# Patient Record
Sex: Female | Born: 1960
Health system: Southern US, Community
[De-identification: ages and names within clinical notes are randomized; demographics above are authoritative.]

## PROBLEM LIST (undated history)

## (undated) DIAGNOSIS — E785 Hyperlipidemia, unspecified: Secondary | ICD-10-CM

## (undated) DIAGNOSIS — N189 Chronic kidney disease, unspecified: Secondary | ICD-10-CM

## (undated) DIAGNOSIS — G2581 Restless legs syndrome: Secondary | ICD-10-CM

## (undated) DIAGNOSIS — I10 Essential (primary) hypertension: Secondary | ICD-10-CM

## (undated) DIAGNOSIS — E119 Type 2 diabetes mellitus without complications: Secondary | ICD-10-CM

## (undated) HISTORY — PX: BREAST SURGERY: SHX581

## (undated) HISTORY — DX: Type 2 diabetes mellitus without complications: E11.9

## (undated) HISTORY — PX: ABDOMINAL HYSTERECTOMY: SHX81

## (undated) HISTORY — PX: LAPAROSCOPY: SHX197

## (undated) HISTORY — DX: Restless legs syndrome: G25.81

## (undated) HISTORY — DX: Essential (primary) hypertension: I10

## (undated) HISTORY — PX: FOOT SURGERY: SHX648

## (undated) HISTORY — DX: Hyperlipidemia, unspecified: E78.5

## (undated) HISTORY — DX: Chronic kidney disease, unspecified: N18.9

---

## 1997-06-05 HISTORY — PX: BREAST EXCISIONAL BIOPSY: SUR124

## 2005-05-06 ENCOUNTER — Emergency Department: Payer: Self-pay | Admitting: Emergency Medicine

## 2007-04-15 DIAGNOSIS — K2100 Gastro-esophageal reflux disease with esophagitis, without bleeding: Secondary | ICD-10-CM | POA: Insufficient documentation

## 2007-11-04 DIAGNOSIS — I10 Essential (primary) hypertension: Secondary | ICD-10-CM | POA: Insufficient documentation

## 2007-11-04 DIAGNOSIS — E785 Hyperlipidemia, unspecified: Secondary | ICD-10-CM | POA: Insufficient documentation

## 2007-11-04 DIAGNOSIS — E1169 Type 2 diabetes mellitus with other specified complication: Secondary | ICD-10-CM | POA: Insufficient documentation

## 2008-07-08 ENCOUNTER — Ambulatory Visit: Payer: Self-pay | Admitting: Family Medicine

## 2009-04-05 DIAGNOSIS — E559 Vitamin D deficiency, unspecified: Secondary | ICD-10-CM | POA: Insufficient documentation

## 2009-07-27 ENCOUNTER — Ambulatory Visit: Payer: Self-pay | Admitting: Family Medicine

## 2009-08-07 DIAGNOSIS — J309 Allergic rhinitis, unspecified: Secondary | ICD-10-CM | POA: Insufficient documentation

## 2009-09-21 DIAGNOSIS — G2581 Restless legs syndrome: Secondary | ICD-10-CM | POA: Insufficient documentation

## 2010-08-15 ENCOUNTER — Ambulatory Visit: Payer: Self-pay | Admitting: Family Medicine

## 2010-11-16 LAB — HM COLONOSCOPY

## 2011-08-30 ENCOUNTER — Ambulatory Visit: Payer: Self-pay | Admitting: Family Medicine

## 2012-04-11 ENCOUNTER — Ambulatory Visit: Payer: Self-pay | Admitting: Family Medicine

## 2012-05-17 ENCOUNTER — Ambulatory Visit: Payer: Self-pay | Admitting: Family Medicine

## 2012-05-27 ENCOUNTER — Ambulatory Visit: Payer: Self-pay | Admitting: Family Medicine

## 2012-09-02 ENCOUNTER — Ambulatory Visit: Payer: Self-pay | Admitting: Family Medicine

## 2012-09-30 ENCOUNTER — Ambulatory Visit: Payer: Self-pay | Admitting: Physician Assistant

## 2014-03-12 ENCOUNTER — Ambulatory Visit: Payer: Self-pay | Admitting: Family Medicine

## 2014-08-10 ENCOUNTER — Ambulatory Visit: Payer: Self-pay | Admitting: Family Medicine

## 2014-08-10 LAB — CBC AND DIFFERENTIAL
HEMATOCRIT: 44 % (ref 36–46)
HEMOGLOBIN: 15.3 g/dL (ref 12.0–16.0)
NEUTROS ABS: 7 /uL
Platelets: 313 10*3/uL (ref 150–399)
WBC: 11.5 10^3/mL

## 2014-08-10 LAB — LIPID PANEL
CHOLESTEROL: 213 mg/dL — AB (ref 0–200)
HDL: 40 mg/dL (ref 35–70)
LDL Cholesterol: 115 mg/dL
LDL/HDL RATIO: 2.9
TRIGLYCERIDES: 292 mg/dL — AB (ref 40–160)

## 2014-08-10 LAB — BASIC METABOLIC PANEL
BUN: 10 mg/dL (ref 4–21)
Creatinine: 0.6 mg/dL (ref 0.5–1.1)
GLUCOSE: 113 mg/dL
POTASSIUM: 4.1 mmol/L (ref 3.4–5.3)
SODIUM: 137 mmol/L (ref 137–147)

## 2014-08-10 LAB — HEPATIC FUNCTION PANEL
ALT: 28 U/L (ref 7–35)
AST: 25 U/L (ref 13–35)
Alkaline Phosphatase: 73 U/L (ref 25–125)
Bilirubin, Total: 0.5 mg/dL

## 2014-08-10 LAB — TSH: TSH: 2.15 u[IU]/mL (ref 0.41–5.90)

## 2015-07-12 DIAGNOSIS — R7303 Prediabetes: Secondary | ICD-10-CM | POA: Insufficient documentation

## 2015-07-12 DIAGNOSIS — G5620 Lesion of ulnar nerve, unspecified upper limb: Secondary | ICD-10-CM | POA: Insufficient documentation

## 2015-07-12 DIAGNOSIS — E669 Obesity, unspecified: Secondary | ICD-10-CM | POA: Insufficient documentation

## 2015-07-12 DIAGNOSIS — M1990 Unspecified osteoarthritis, unspecified site: Secondary | ICD-10-CM | POA: Insufficient documentation

## 2015-08-12 ENCOUNTER — Encounter: Payer: Self-pay | Admitting: Family Medicine

## 2015-08-25 ENCOUNTER — Other Ambulatory Visit: Payer: Self-pay | Admitting: Family Medicine

## 2015-08-31 ENCOUNTER — Encounter: Payer: Self-pay | Admitting: Family Medicine

## 2015-08-31 ENCOUNTER — Ambulatory Visit (INDEPENDENT_AMBULATORY_CARE_PROVIDER_SITE_OTHER): Payer: BLUE CROSS/BLUE SHIELD | Admitting: Family Medicine

## 2015-08-31 VITALS — BP 122/70 | HR 88 | Temp 98.1°F | Resp 16 | Ht 60.0 in | Wt 180.0 lb

## 2015-08-31 DIAGNOSIS — Z1211 Encounter for screening for malignant neoplasm of colon: Secondary | ICD-10-CM | POA: Diagnosis not present

## 2015-08-31 DIAGNOSIS — G2581 Restless legs syndrome: Secondary | ICD-10-CM | POA: Diagnosis not present

## 2015-08-31 DIAGNOSIS — Z0000 Encounter for general adult medical examination without abnormal findings: Secondary | ICD-10-CM | POA: Diagnosis not present

## 2015-08-31 DIAGNOSIS — R7303 Prediabetes: Secondary | ICD-10-CM | POA: Diagnosis not present

## 2015-08-31 DIAGNOSIS — Z1239 Encounter for other screening for malignant neoplasm of breast: Secondary | ICD-10-CM

## 2015-08-31 LAB — IFOBT (OCCULT BLOOD): IFOBT: NEGATIVE

## 2015-08-31 MED ORDER — TRAMADOL HCL 50 MG PO TABS: 50.0000 mg | ORAL_TABLET | Freq: Every day | ORAL | Status: DC

## 2015-08-31 NOTE — Progress Notes (Signed)
Patient ID: Barbara Holmes, female   DOB: [birth year], 55 y.o.   MRN: [id number]       Patient: Barbara Holmes, Female    DOB: 1961/04/06, 55 y.o.   MRN: [id number] Visit Date: 08/31/2015  Today's Provider: Wilhemena Durie, MD   Chief Complaint  Patient presents with  . Annual Exam   Subjective:    Annual physical exam Barbara Holmes is a 55 y.o. female who presents today for health maintenance and complete physical. She feels well. She reports exercising 2-3 times a week. She walks on the treadmill for 20-30 minutes. She reports she is sleeping poorly. Patient snores but a negative sleep study 10 years ago. She complains of significant restless leg syndrome with weights are up nightly ----------------------------------------------------------------- Colonoscopy- about 3 years ago per pt. Did not see record of this. Repeat 10 years. Dr. Vira Agar.  Mammogram- 03/12/14 negative BMD- 08/16/11- Normal Pap- 07/28/09  Immunization History  Administered Date(s) Administered  . Tdap 08/01/2012       Review of Systems  Constitutional: Negative.   HENT: Positive for ear pain, hearing loss, sinus pressure and sneezing.   Eyes: Positive for itching.  Respiratory: Negative.   Cardiovascular: Negative.   Gastrointestinal: Negative.   Endocrine: Negative.   Genitourinary: Negative.   Musculoskeletal: Positive for joint swelling and arthralgias.  Skin: Negative.   Allergic/Immunologic: Negative.   Neurological: Negative.   Hematological: Negative.   Psychiatric/Behavioral: Negative.     Social History      She         Social History   Social History  . Marital Status: Married    Spouse Name: N/A  . Number of Children: N/A  . Years of Education: N/A   Social History Main Topics  . Smoking status: Not on file  . Smokeless tobacco: Not on file  . Alcohol Use: Not on file  . Drug Use: Not on file  . Sexual Activity: Not on file   Other Topics Concern  . Not on  file   Social History Narrative  . No narrative on file    No past medical history on file.   Patient Active Problem List   Diagnosis Date Noted  . Arthritis 07/12/2015  . Borderline diabetes 07/12/2015  . Adiposity 07/12/2015  . Lesion of ulnar nerve 07/12/2015  . Restless leg 09/21/2009  . Allergic rhinitis 08/07/2009  . Avitaminosis D 04/05/2009  . Essential (primary) hypertension 11/04/2007  . HLD (hyperlipidemia) 11/04/2007  . Esophagitis, reflux 04/15/2007    Past Surgical History  Procedure Laterality Date  . Breast biopsy      benign  . Foot surgery    . Laparoscopy      endometriosis  . Abdominal hysterectomy      total    Family History        Family Status  Relation Status Death Age  . Mother Alive   . Father Alive   . Sister Alive   . Brother Deceased         Her family history includes Diabetes in her mother and sister; Hyperlipidemia in her father, mother, and sister; Hypertension in her father, mother, and sister; Skin cancer in her sister.    No Known Allergies  Previous Medications   CHOLECALCIFEROL 1000 UNITS CAPSULE    Take by mouth.   LANSOPRAZOLE (PREVACID) 30 MG CAPSULE    Take by mouth.   LISINOPRIL (PRINIVIL,ZESTRIL) 20 MG TABLET    TAKE 1 TABLET BY  MOUTH DAILY   NAPROXEN (NAPROSYN) 500 MG TABLET    Take by mouth.   SIMVASTATIN (ZOCOR) 10 MG TABLET    Take by mouth.    Patient Care Team: Jerrol Banana., MD as PCP - General (Family Medicine)     Objective:   Vitals: There were no vitals taken for this visit.   Physical Exam  Constitutional: She is oriented to person, place, and time. She appears well-developed and well-nourished.  HENT:  Head: Normocephalic and atraumatic.  Right Ear: External ear normal.  Left Ear: External ear normal.  Nose: Nose normal.  Mouth/Throat: Oropharynx is clear and moist.  Eyes: Conjunctivae and EOM are normal. Pupils are equal, round, and reactive to light.  Neck: Normal range of  motion. Neck supple.  Cardiovascular: Normal rate, regular rhythm, normal heart sounds and intact distal pulses.   Pulmonary/Chest: Effort normal and breath sounds normal.  Abdominal: Soft. Bowel sounds are normal.  Genitourinary: Guaiac negative stool.  Musculoskeletal: Normal range of motion.  Neurological: She is alert and oriented to person, place, and time. She has normal reflexes.  Skin: Skin is warm and dry.  Psychiatric: She has a normal mood and affect. Her behavior is normal. Judgment and thought content normal.  breasts-no masses   Depression Screen PHQ 2/9 Scores 08/31/2015  PHQ - 2 Score 0      Assessment & Plan:     Routine Health Maintenance and Physical Exam  Exercise Activities and Dietary recommendations Goals    None      Immunization History  Administered Date(s) Administered  . Tdap 08/01/2012    Health Maintenance  Topic Date Due  . Hepatitis C Screening  09-15-1960  . HIV Screening  03/21/1974  . PAP SMEAR  03/21/1980  . MAMMOGRAM  03/21/2009  . COLONOSCOPY  03/21/2009  . INFLUENZA VACCINE  01/04/2015  . TETANUS/TDAP  08/01/2022   1. Annual physical exam  - CBC with Differential/Platelet - Lipid Panel With LDL/HDL Ratio - TSH - Comprehensive metabolic panel  2. Borderline diabetes  - Hemoglobin A1c  3. RLS (restless legs syndrome) Follow up in 6 months, Pt will call if RLS gets worse. - traMADol (ULTRAM) 50 MG tablet; Take 1 tablet (50 mg total) by mouth at bedtime. 1-2 at bedtime  Dispense: 60 tablet; Refill: 5 4. Obesity   Discussed health benefits of physical activity, and encouraged her to engage in regular exercise appropriate for her age and condition.   I have done the exam and reviewed the above chart and it is accurate to the best of my knowledge.  --------------------------------------------------------------------

## 2015-09-01 ENCOUNTER — Telehealth: Payer: Self-pay

## 2015-09-01 DIAGNOSIS — E119 Type 2 diabetes mellitus without complications: Secondary | ICD-10-CM

## 2015-09-01 LAB — LIPID PANEL WITH LDL/HDL RATIO
Cholesterol, Total: 157 mg/dL (ref 100–199)
HDL: 26 mg/dL — AB (ref >39)
LDL Calculated: 86 mg/dL (ref 0–99)
LDl/HDL Ratio: 3.3 ratio units — ABNORMAL HIGH (ref 0.0–3.2)
Triglycerides: 224 mg/dL — ABNORMAL HIGH (ref 0–149)
VLDL Cholesterol Cal: 45 mg/dL — ABNORMAL HIGH (ref 5–40)

## 2015-09-01 LAB — COMPREHENSIVE METABOLIC PANEL
A/G RATIO: 1.4 (ref 1.2–2.2)
ALT: 37 IU/L — AB (ref 0–32)
AST: 37 IU/L (ref 0–40)
Albumin: 4.3 g/dL (ref 3.5–5.5)
Alkaline Phosphatase: 66 IU/L (ref 39–117)
BUN/Creatinine Ratio: 15 (ref 9–23)
BUN: 9 mg/dL (ref 6–24)
Bilirubin Total: 0.6 mg/dL (ref 0.0–1.2)
CALCIUM: 9.9 mg/dL (ref 8.7–10.2)
CO2: 30 mmol/L — ABNORMAL HIGH (ref 18–29)
Chloride: 92 mmol/L — ABNORMAL LOW (ref 96–106)
Creatinine, Ser: 0.59 mg/dL (ref 0.57–1.00)
GFR, EST AFRICAN AMERICAN: 118 mL/min/{1.73_m2} (ref >59)
GFR, EST NON AFRICAN AMERICAN: 103 mL/min/{1.73_m2} (ref >59)
GLUCOSE: 179 mg/dL — AB (ref 65–99)
Globulin, Total: 3 g/dL (ref 1.5–4.5)
Potassium: 5.1 mmol/L (ref 3.5–5.2)
Sodium: 140 mmol/L (ref 134–144)
TOTAL PROTEIN: 7.3 g/dL (ref 6.0–8.5)

## 2015-09-01 LAB — CBC WITH DIFFERENTIAL/PLATELET
BASOS: 0 %
Basophils Absolute: 0 10*3/uL (ref 0.0–0.2)
EOS (ABSOLUTE): 0.3 10*3/uL (ref 0.0–0.4)
EOS: 3 %
HEMOGLOBIN: 14.8 g/dL (ref 11.1–15.9)
Hematocrit: 43.8 % (ref 34.0–46.6)
IMMATURE GRANULOCYTES: 0 %
Immature Grans (Abs): 0 10*3/uL (ref 0.0–0.1)
LYMPHS: 28 %
Lymphocytes Absolute: 3.2 10*3/uL — ABNORMAL HIGH (ref 0.7–3.1)
MCH: 29.2 pg (ref 26.6–33.0)
MCHC: 33.8 g/dL (ref 31.5–35.7)
MCV: 86 fL (ref 79–97)
Monocytes Absolute: 0.6 10*3/uL (ref 0.1–0.9)
Monocytes: 5 %
NEUTROS PCT: 64 %
Neutrophils Absolute: 7.1 10*3/uL — ABNORMAL HIGH (ref 1.4–7.0)
Platelets: 302 10*3/uL (ref 150–379)
RBC: 5.07 x10E6/uL (ref 3.77–5.28)
RDW: 13.2 % (ref 12.3–15.4)
WBC: 11.2 10*3/uL — AB (ref 3.4–10.8)

## 2015-09-01 LAB — HEMOGLOBIN A1C
ESTIMATED AVERAGE GLUCOSE: 194 mg/dL
HEMOGLOBIN A1C: 8.4 % — AB (ref 4.8–5.6)

## 2015-09-01 LAB — TSH: TSH: 2.02 u[IU]/mL (ref 0.450–4.500)

## 2015-09-01 MED ORDER — METFORMIN HCL ER 500 MG PO TB24: 500.0000 mg | ORAL_TABLET | Freq: Every day | ORAL | Status: DC

## 2015-09-01 NOTE — Telephone Encounter (Signed)
Advised pt of lab results. Pt verbally acknowledges understanding. Sent in rx, scheduled referral as well as 3 month FU. Renaldo Fiddler, CMA

## 2015-09-01 NOTE — Telephone Encounter (Signed)
LMTCB 09/01/2015  Thanks,   -Laura  

## 2015-09-01 NOTE — Telephone Encounter (Signed)
-----   Message from Jerrol Banana., MD sent at 09/01/2015  3:45 PM EDT ----- Labs okay but with A1c of 8 patient is frankly diabetic. Start metformin 500 mg every evening and refer to [facility]. Return to clinic 3 months.

## 2015-09-14 ENCOUNTER — Encounter: Payer: BLUE CROSS/BLUE SHIELD | Attending: Family Medicine | Admitting: *Deleted

## 2015-09-14 ENCOUNTER — Encounter: Payer: Self-pay | Admitting: *Deleted

## 2015-09-14 VITALS — BP 140/86 | Ht 59.0 in | Wt 177.1 lb

## 2015-09-14 DIAGNOSIS — E119 Type 2 diabetes mellitus without complications: Secondary | ICD-10-CM | POA: Insufficient documentation

## 2015-09-14 NOTE — Patient Instructions (Addendum)
Check blood sugars 1 x day before breakfast or 2 hrs after supper every day Exercise: Continue walking for  30 minutes  3 days a week and gradually increase to 150 minutes/week Eat 3 meals day,  1-2 snacks a day Space meals 4-6 hours apart Avoid sugar sweetened drinks (juices) Bring blood sugar records to the next class

## 2015-09-15 NOTE — Progress Notes (Signed)
Diabetes Self-Management Education  Visit Type: First/Initial  Appt. Start Time: 1545 Appt. End Time: [id number]  09/15/2015  [name], identified by name and date of birth, is a 55 y.o. female with a diagnosis of Diabetes: Type 2.   ASSESSMENT  Blood pressure 140/86, height 4\' 11"  (1.499 m), weight 177 lb 1.6 oz (80.332 kg). Body mass index is 35.75 kg/(m^2).      Diabetes Self-Management Education - 09/14/15 1723    Visit Information   Visit Type First/Initial   Initial Visit   Diabetes Type Type 2   Are you currently following a meal plan? Yes   What type of meal plan do you follow? "quit eating sweets, no sodas, not night time eating"   Are you taking your medications as prescribed? Yes   Date Diagnosed 2 weeks ago   Health Coping   How would you rate your overall health? Good   Psychosocial Assessment   Patient Belief/Attitude about Diabetes Motivated to manage diabetes  feels "bad"   Self-care barriers None   Self-management support Doctor's office;Family   Patient Concerns Nutrition/Meal planning;Medication;Monitoring;Healthy Lifestyle;Glycemic Control;Weight Control;Problem Solving   Special Needs None   Preferred Learning Style Auditory;Visual   Learning Readiness Change in progress   How often do you need to have someone help you when you read instructions, pamphlets, or other written materials from your doctor or pharmacy? 1 - Never   What is the last grade level you completed in school? 12   Complications   Last HgB A1C per patient/outside source 8.4 %  09/01/15   How often do you check your blood sugar? 1-2 times/day   Fasting Blood glucose range (mg/dL) 130-179  Pt reports FBG's 143-175 mg/dL   Have you had a dilated eye exam in the past 12 months? Yes   Have you had a dental exam in the past 12 months? Yes   Are you checking your feet? No   Dietary Intake   Breakfast oatmeal, cereal and milk, crackers and boiled egg   Lunch nabs, ham/turkey sandwich,  cuccumber   Dinner baked fish stick, 4 tater tots, rice, peas, corn, pinto beans   Snack (evening) jello or sugar free pudding   Beverage(s) water, fruit juce, unsweetened coffee   Exercise   Exercise Type Light (walking / raking leaves)   How many days per week to you exercise? 3   How many minutes per day do you exercise? 30   Total minutes per week of exercise 90   Patient Education   Previous Diabetes Education No   Disease state  Definition of diabetes, type 1 and 2, and the diagnosis of diabetes   Nutrition management  Role of diet in the treatment of diabetes and the relationship between the three main macronutrients and blood glucose level;Food label reading, portion sizes and measuring food.   Physical activity and exercise  Role of exercise on diabetes management, blood pressure control and cardiac health.   Medications Reviewed patients medication for diabetes, action, purpose, timing of dose and side effects.   Monitoring Purpose and frequency of SMBG.;Identified appropriate SMBG and/or A1C goals.   Chronic complications Relationship between chronic complications and blood glucose control   Psychosocial adjustment Identified and addressed patients feelings and concerns about diabetes   Individualized Goals (developed by patient)   Reducing Risk Improve blood sugars Decrease medications Prevent diabetes complications Lose weight Lead a healthier lifestyle Become more fit   Outcomes   Expected Outcomes Demonstrated interest in  learning. Expect positive outcomes      Individualized Plan for Diabetes Self-Management Training:   Learning Objective:  Patient will have a greater understanding of diabetes self-management. Patient education plan is to attend individual and/or group sessions per assessed needs and concerns.   Plan:   Patient Instructions  Check blood sugars 1 x day before breakfast or 2 hrs after supper every day Exercise: Continue walking for  30 minutes  3  days a week and gradually increase to 150 minutes/week Eat 3 meals day,  1-2 snacks a day Space meals 4-6 hours apart Avoid sugar sweetened drinks (juices) Bring blood sugar records to the next class   Expected Outcomes:  Demonstrated interest in learning. Expect positive outcomes  Education material provided:  General Meal Planning Guidelines Simple Meal Plan  If problems or questions, patient to contact team via:   Barbara Drilling, RN, [place], CDE [phone]  Future DSME appointment:  Monday May 8 for Class 1

## 2015-10-11 ENCOUNTER — Encounter: Payer: Self-pay | Admitting: Dietician

## 2015-10-11 ENCOUNTER — Encounter: Payer: BLUE CROSS/BLUE SHIELD | Attending: Family Medicine | Admitting: Dietician

## 2015-10-11 VITALS — Ht 59.0 in | Wt 174.9 lb

## 2015-10-11 DIAGNOSIS — E119 Type 2 diabetes mellitus without complications: Secondary | ICD-10-CM | POA: Insufficient documentation

## 2015-10-11 NOTE — Progress Notes (Signed)

## 2015-10-18 ENCOUNTER — Encounter: Payer: BLUE CROSS/BLUE SHIELD | Admitting: Dietician

## 2015-10-18 ENCOUNTER — Encounter: Payer: Self-pay | Admitting: Dietician

## 2015-10-18 VITALS — Wt 172.2 lb

## 2015-10-18 DIAGNOSIS — E119 Type 2 diabetes mellitus without complications: Secondary | ICD-10-CM | POA: Diagnosis not present

## 2015-10-18 NOTE — Progress Notes (Signed)
FBG's elevated-Faxed BG's to Dr. Rosanna Randy

## 2015-10-25 ENCOUNTER — Encounter: Payer: BLUE CROSS/BLUE SHIELD | Admitting: Dietician

## 2015-10-25 VITALS — BP 140/78 | Wt 171.1 lb

## 2015-10-25 DIAGNOSIS — E119 Type 2 diabetes mellitus without complications: Secondary | ICD-10-CM

## 2015-10-28 ENCOUNTER — Other Ambulatory Visit: Payer: Self-pay | Admitting: Family Medicine

## 2015-11-02 ENCOUNTER — Encounter: Payer: Self-pay | Admitting: *Deleted

## 2015-11-02 ENCOUNTER — Encounter: Payer: Self-pay | Admitting: Dietician

## 2015-12-01 ENCOUNTER — Encounter: Payer: Self-pay | Admitting: Family Medicine

## 2015-12-01 ENCOUNTER — Ambulatory Visit (INDEPENDENT_AMBULATORY_CARE_PROVIDER_SITE_OTHER): Payer: BLUE CROSS/BLUE SHIELD | Admitting: Family Medicine

## 2015-12-01 VITALS — BP 118/62 | HR 72 | Temp 98.7°F | Resp 16 | Wt 169.0 lb

## 2015-12-01 DIAGNOSIS — E119 Type 2 diabetes mellitus without complications: Secondary | ICD-10-CM | POA: Diagnosis not present

## 2015-12-01 LAB — POCT GLYCOSYLATED HEMOGLOBIN (HGB A1C)
Est. average glucose Bld gHb Est-mCnc: 151
Hemoglobin A1C: 6.9

## 2015-12-01 MED ORDER — METFORMIN HCL ER (MOD) 1000 MG PO TB24: 1000.0000 mg | ORAL_TABLET | Freq: Every day | ORAL | Status: DC

## 2015-12-01 NOTE — Progress Notes (Signed)
Patient: Barbara Holmes Female    DOB: 03-30-1961   55 y.o.   MRN: [id number] Visit Date: 12/01/2015  Today's Provider: Wilhemena Durie, MD   Chief Complaint  Patient presents with  . Diabetes   Subjective:    HPI  Diabetes Mellitus Type II, Follow-up:   Lab Results  Component Value Date   HGBA1C 6.9 12/01/2015   HGBA1C 8.4* 08/31/2015    Last seen for diabetes 3 months ago.  Management since then includes starting Metformin 500mg  daily. She reports good compliance with treatment. She is not having side effects.  Home blood sugar records: trend: fluctuating a bit. Patient brought in blood sugar readings for review.  Episodes of hypoglycemia? no   Current Insulin Regimen: none Most Recent Eye Exam:  Weight trend: decreasing steadily. Patient has lost 11lbs since last OV.  Prior visit with dietician: yes - [facility].  Current diet: on average, 3 meals per day Current exercise: walking  Pertinent Labs:    Component Value Date/Time   CHOL 157 08/31/2015 1038   CHOL 213* 08/10/2014   TRIG 224* 08/31/2015 1038   HDL 26* 08/31/2015 1038   HDL 40 08/10/2014   LDLCALC 86 08/31/2015 1038   LDLCALC 115 08/10/2014   CREATININE 0.59 08/31/2015 1038   CREATININE 0.6 08/10/2014    Wt Readings from Last 3 Encounters:  12/01/15 169 lb (76.658 kg)  10/25/15 171 lb 1.6 oz (77.61 kg)  10/18/15 172 lb 3.2 oz (78.109 kg)         No Known Allergies Current Meds  Medication Sig  . Cholecalciferol 1000 units capsule Take 1,000 Units by mouth daily.   . lansoprazole (PREVACID) 30 MG capsule Take 30 mg by mouth daily as needed.   Marland Kitchen lisinopril (PRINIVIL,ZESTRIL) 20 MG tablet TAKE 1 TABLET BY MOUTH DAILY  . metFORMIN (GLUCOPHAGE-XR) 500 MG 24 hr tablet Take 1 tablet (500 mg total) by mouth at bedtime.  . naproxen (NAPROSYN) 500 MG tablet Take 500 mg by mouth 2 (two) times daily as needed.   . simvastatin (ZOCOR) 10 MG tablet TAKE 1 TABLET BY MOUTH  EVERY DAY  . traMADol (ULTRAM) 50 MG tablet Take 1 tablet (50 mg total) by mouth at bedtime. 1-2 at bedtime    Review of Systems  Constitutional: Negative.   Respiratory: Negative.   Cardiovascular: Negative.   Endocrine: Negative.   Musculoskeletal: Negative.   Skin: Negative.   Allergic/Immunologic: Negative.   Neurological: Negative.   Hematological: Negative.   Psychiatric/Behavioral: Negative.     Social History  Substance Use Topics  . Smoking status: Former Smoker -- 3 years    Types: Cigarettes    Quit date: 06/05/1988  . Smokeless tobacco: Never Used     Comment: 1 cigarette per week  . Alcohol Use: No   Objective:   BP 118/62 mmHg  Pulse 72  Temp(Src) 98.7 F (37.1 C)  Resp 16  Wt 169 lb (76.658 kg)  Physical Exam  Constitutional: She is oriented to person, place, and time. She appears well-developed and well-nourished.  HENT:  Head: Normocephalic and atraumatic.  Right Ear: External ear normal.  Left Ear: External ear normal.  Nose: Nose normal.  Eyes: Conjunctivae are normal.  Neck: Neck supple.  Cardiovascular: Normal rate, regular rhythm and normal heart sounds.   Pulmonary/Chest: Effort normal and breath sounds normal.  Abdominal: Soft.  Musculoskeletal: Normal range of motion. She exhibits no edema or tenderness.  Neurological:  She is alert and oriented to person, place, and time. No sensory deficit.  Skin: Skin is warm and dry.        Assessment & Plan:     1. Type 2 diabetes mellitus without complication, without long-term current use of insulin (HCC) Improving. HgbA1c 6.9 today. Will increase Metformin to 1000mg  at bedtime. Follow in 3-4 months to reassess.  - POCT glycosylated hemoglobin (Hb A1C). Good today--goal of 6.5. - metFORMIN (GLUMETZA) 1000 MG (MOD) 24 hr tablet; Take 1 tablet (1,000 mg total) by mouth at bedtime.  Dispense: 30 tablet; Refill: 12  2.Obesity Patient is lost almost 30 pounds in the past year. 3.HTN        Richard Cranford Mon, MD  [facility]

## 2015-12-13 ENCOUNTER — Telehealth: Payer: Self-pay

## 2015-12-13 NOTE — Telephone Encounter (Signed)
Received forms for PA for Metformin June 29th and sent this in, per insurance no PA is needed. Spoke to pharmacist today and they tried to fill it and it said needs PA. Pharmacist will send it in as Metformin ER 500 mg 1 BID and see if that works and will let patient [id number]

## 2016-03-02 ENCOUNTER — Ambulatory Visit: Payer: BLUE CROSS/BLUE SHIELD | Admitting: Family Medicine

## 2016-03-18 ENCOUNTER — Other Ambulatory Visit: Payer: Self-pay | Admitting: Family Medicine

## 2016-03-18 DIAGNOSIS — G2581 Restless legs syndrome: Secondary | ICD-10-CM

## 2016-04-03 ENCOUNTER — Ambulatory Visit (INDEPENDENT_AMBULATORY_CARE_PROVIDER_SITE_OTHER): Payer: BLUE CROSS/BLUE SHIELD | Admitting: Family Medicine

## 2016-04-03 VITALS — BP 140/68 | HR 80 | Temp 98.2°F | Resp 16 | Wt 168.0 lb

## 2016-04-03 DIAGNOSIS — Z23 Encounter for immunization: Secondary | ICD-10-CM

## 2016-04-03 DIAGNOSIS — E119 Type 2 diabetes mellitus without complications: Secondary | ICD-10-CM | POA: Diagnosis not present

## 2016-04-03 LAB — POCT GLYCOSYLATED HEMOGLOBIN (HGB A1C): Hemoglobin A1C: 6.4

## 2016-04-03 NOTE — Progress Notes (Signed)
Barbara Holmes  MRN: [id number] DOB: 1961-03-17  Subjective:  HPI   The patient is a 55 year old female who presents for follow up of her diabetes and hypertension.  She was last seen on 12/01/15 and her A1C at that time was 6.9.  She states she checks her glucose and has been getting readings ranging from 107-125.  She does not check her blood pressure outside of the office.    Patient Active Problem List   Diagnosis Date Noted  . Diabetes mellitus, type 2 ([place]) 09/01/2015  . Arthritis 07/12/2015  . Borderline diabetes 07/12/2015  . Adiposity 07/12/2015  . Lesion of ulnar nerve 07/12/2015  . Restless leg 09/21/2009  . Allergic rhinitis 08/07/2009  . Avitaminosis D 04/05/2009  . Essential (primary) hypertension 11/04/2007  . HLD (hyperlipidemia) 11/04/2007  . Esophagitis, reflux 04/15/2007    Past Medical History:  Diagnosis Date  . Diabetes mellitus without complication ([place])   . Hyperlipidemia   . Hypertension   . Restless leg syndrome     Social History   Social History  . Marital status: Married    Spouse name: N/A  . Number of children: N/A  . Years of education: N/A   Occupational History  . Not on file.   Social History Main Topics  . Smoking status: Former Smoker    Years: 3.00    Types: Cigarettes    Quit date: 06/05/1988  . Smokeless tobacco: Never Used     Comment: 1 cigarette per week  . Alcohol use No  . Drug use: No  . Sexual activity: Not on file   Other Topics Concern  . Not on file   Social History Narrative  . No narrative on file    Outpatient Encounter Prescriptions as of 04/03/2016  Medication Sig Note  . Cholecalciferol 1000 units capsule Take 1,000 Units by mouth daily.  07/12/2015: Received from: [facility]  . lansoprazole (PREVACID) 30 MG capsule Take 30 mg by mouth daily as needed.  07/12/2015: Received from: [facility]  . lisinopril (PRINIVIL,ZESTRIL) 20 MG tablet TAKE 1 TABLET BY MOUTH  DAILY   . metFORMIN (GLUMETZA) 1000 MG (MOD) 24 hr tablet Take 1 tablet (1,000 mg total) by mouth at bedtime.   . naproxen (NAPROSYN) 500 MG tablet Take 500 mg by mouth 2 (two) times daily as needed.  07/12/2015: Medication taken as needed.  Received from: [facility]  . simvastatin (ZOCOR) 10 MG tablet TAKE 1 TABLET BY MOUTH EVERY DAY   . traMADol (ULTRAM) 50 MG tablet TAKE 1 TO 2 TABLETS BY MOUTH AT BEDTIME    No facility-administered encounter medications on file as of 04/03/2016.     No Known Allergies  Review of Systems  Constitutional: Negative for fever, malaise/fatigue and weight loss.  Eyes: Negative.   Respiratory: Negative for cough, shortness of breath and wheezing.   Cardiovascular: Negative for chest pain, palpitations, orthopnea, claudication, leg swelling and PND.  Gastrointestinal: Negative.   Genitourinary: Negative for frequency.  Neurological: Negative for dizziness, weakness and headaches.  Endo/Heme/Allergies: Negative for polydipsia.  Psychiatric/Behavioral: Negative.    Objective:  BP 140/68 (BP Location: Right Arm, Patient Position: Sitting, Cuff Size: Normal)   Pulse 80   Temp 98.2 F (36.8 C) (Oral)   Resp 16   Wt 168 lb (76.2 kg)   BMI 33.93 kg/m   Physical Exam  Constitutional: She is oriented to person, place, and time and well-developed, well-nourished, and in no  distress.  HENT:  Head: Normocephalic and atraumatic.  Right Ear: External ear normal.  Left Ear: External ear normal.  Nose: Nose normal.  Eyes: Conjunctivae are normal.  Neck: Neck supple. No thyromegaly present.  Cardiovascular: Normal rate, regular rhythm and normal heart sounds.   Pulmonary/Chest: Effort normal and breath sounds normal.  Abdominal: Soft.  Neurological: She is alert and oriented to person, place, and time. No cranial nerve deficit. She exhibits normal muscle tone. Gait normal.  Skin: Skin is warm and dry.  Psychiatric: Mood, memory, affect and  judgment normal.  Diabetic monofilament foot exam is completely normal.  Assessment and Plan :  Type 2 diabetes Excellent control with A1c today of 6.4, last was 6.9.RTC 6 months. Health maintenance Pneumovax given today due to diabetes Morbid obesity Patient has plateaued but has lost 30 pounds overall. I praised her for the excellent effort. I have done the exam and reviewed the chart and it is accurate to the best of my knowledge. Miguel Aschoff M.D. [facility]

## 2016-04-10 ENCOUNTER — Other Ambulatory Visit: Payer: Self-pay

## 2016-08-30 LAB — HM DIABETES EYE EXAM

## 2016-09-13 ENCOUNTER — Encounter: Payer: Self-pay | Admitting: Family Medicine

## 2016-09-13 ENCOUNTER — Ambulatory Visit (INDEPENDENT_AMBULATORY_CARE_PROVIDER_SITE_OTHER): Payer: BLUE CROSS/BLUE SHIELD | Admitting: Family Medicine

## 2016-09-13 VITALS — BP 110/60 | HR 70 | Temp 97.9°F | Resp 16 | Ht 59.0 in | Wt 181.0 lb

## 2016-09-13 DIAGNOSIS — E119 Type 2 diabetes mellitus without complications: Secondary | ICD-10-CM | POA: Diagnosis not present

## 2016-09-13 DIAGNOSIS — Z0000 Encounter for general adult medical examination without abnormal findings: Secondary | ICD-10-CM

## 2016-09-13 LAB — POCT URINALYSIS DIPSTICK
Bilirubin, UA: NEGATIVE
Glucose, UA: NEGATIVE
KETONES UA: NEGATIVE
Leukocytes, UA: NEGATIVE — AB
Nitrite, UA: NEGATIVE
PROTEIN UA: NEGATIVE
RBC UA: NEGATIVE
Spec Grav, UA: 1.02 (ref 1.010–1.025)
UROBILINOGEN UA: 0.2 U/dL
pH, UA: 5 (ref 5.0–8.0)

## 2016-09-13 LAB — POCT UA - MICROALBUMIN: Microalbumin Ur, POC: NEGATIVE mg/L

## 2016-09-13 NOTE — Progress Notes (Signed)
Patient: Barbara Holmes, Female    DOB: 03-23-1961, 56 y.o.   MRN: [id number] Visit Date: 09/13/2016  Today's Provider: Wilhemena Durie, MD   Chief Complaint  Patient presents with  . Annual Exam   Subjective:    Annual physical exam Barbara Holmes is a 56 y.o. female who presents today for health maintenance and complete physical. She feels well. She reports she is not exercising. She reports she is sleeping fairly well. ----------------------------------------------------------------- Mammogram- 03/12/14 normal BMD- 08/16/11 osteopenia Pap- 07/28/09 Colonoscopy-11/16/10   Pt reports that she had her pap/vaginal exam and her rectal exam last year. She only wants breast exam today.   Review of Systems  Constitutional: Negative.   HENT: Positive for ear pain, sinus pressure and sneezing.   Eyes: Positive for itching.  Respiratory: Negative.   Cardiovascular: Negative.   Gastrointestinal: Negative.   Endocrine: Negative.   Genitourinary: Negative.   Musculoskeletal: Positive for arthralgias.  Skin: Negative.   Allergic/Immunologic: Negative.   Neurological: Negative.   Hematological: Negative.   Psychiatric/Behavioral: Negative.     Social History      She  reports that she quit smoking about 28 years ago. Her smoking use included Cigarettes. She quit after 3.00 years of use. She has never used smokeless tobacco. She reports that she does not drink alcohol or use drugs.       Social History   Social History  . Marital status: Married    Spouse name: N/A  . Number of children: N/A  . Years of education: N/A   Social History Main Topics  . Smoking status: Former Smoker    Years: 3.00    Types: Cigarettes    Quit date: 06/05/1988  . Smokeless tobacco: Never Used     Comment: 1 cigarette per week  . Alcohol use No  . Drug use: No  . Sexual activity: Not Asked   Other Topics Concern  . None   Social History Narrative  . None    Past Medical  History:  Diagnosis Date  . Diabetes mellitus without complication ([place])   . Hyperlipidemia   . Hypertension   . Restless leg syndrome      Patient Active Problem List   Diagnosis Date Noted  . Diabetes mellitus, type 2 ([place]) 09/01/2015  . Arthritis 07/12/2015  . Borderline diabetes 07/12/2015  . Adiposity 07/12/2015  . Lesion of ulnar nerve 07/12/2015  . Restless leg 09/21/2009  . Allergic rhinitis 08/07/2009  . Avitaminosis D 04/05/2009  . Essential (primary) hypertension 11/04/2007  . HLD (hyperlipidemia) 11/04/2007  . Esophagitis, reflux 04/15/2007    Past Surgical History:  Procedure Laterality Date  . ABDOMINAL HYSTERECTOMY     total  . BREAST BIOPSY     benign  . FOOT SURGERY    . LAPAROSCOPY     endometriosis    Family History        Family Status  Relation Status  . Mother Alive  . Father Alive  . Sister Alive  . Brother Deceased  . Maternal Grandmother Deceased        Her family history includes Diabetes in her maternal grandmother, mother, and sister; Hyperlipidemia in her father, mother, and sister; Hypertension in her father, mother, and sister; Skin cancer in her sister.     No Known Allergies   Current Outpatient Prescriptions:  .  Cholecalciferol 1000 units capsule, Take 1,000 Units by mouth daily. , Disp: , Rfl:  .  lansoprazole (PREVACID) 30 MG capsule, Take 30 mg by mouth daily as needed. , Disp: , Rfl:  .  lisinopril (PRINIVIL,ZESTRIL) 20 MG tablet, TAKE 1 TABLET BY MOUTH DAILY, Disp: 90 tablet, Rfl: 3 .  metFORMIN (GLUMETZA) 1000 MG (MOD) 24 hr tablet, Take 1 tablet (1,000 mg total) by mouth at bedtime., Disp: 30 tablet, Rfl: 12 .  naproxen (NAPROSYN) 500 MG tablet, Take 500 mg by mouth 2 (two) times daily as needed. , Disp: , Rfl:  .  simvastatin (ZOCOR) 10 MG tablet, TAKE 1 TABLET BY MOUTH EVERY DAY, Disp: 90 tablet, Rfl: 3 .  traMADol (ULTRAM) 50 MG tablet, TAKE 1 TO 2 TABLETS BY MOUTH AT BEDTIME, Disp: 60 tablet, Rfl: 5   Patient  Care Team: Jerrol Banana., MD as PCP - General (Family Medicine)      Objective:   Vitals: BP 110/60 (BP Location: Left Arm, Patient Position: Sitting, Cuff Size: Large)   Pulse 70   Temp 97.9 F (36.6 C) (Oral)   Resp 16   Ht 4\' 11"  (1.499 m)   Wt 181 lb (82.1 kg)   BMI 36.56 kg/m    Vitals:   09/13/16 0914  BP: 110/60  Pulse: 70  Resp: 16  Temp: 97.9 F (36.6 C)  TempSrc: Oral  Weight: 181 lb (82.1 kg)  Height: 4\' 11"  (1.499 m)    Depression screen [facility] 2/9 09/13/2016 09/14/2015 08/31/2015  Decreased Interest 0 0 0  Down, Depressed, Hopeless 0 0 0  PHQ - 2 Score 0 0 0  Altered sleeping 0 - -  Tired, decreased energy 1 - -  Change in appetite 0 - -  Feeling bad or failure about yourself  0 - -  Trouble concentrating 0 - -  Moving slowly or fidgety/restless 0 - -  Suicidal thoughts 0 - -  PHQ-9 Score 1 - -      Physical Exam  Constitutional: She is oriented to person, place, and time. She appears well-developed and well-nourished.  HENT:  Head: Normocephalic and atraumatic.  Right Ear: External ear normal.  Left Ear: External ear normal.  Nose: Nose normal.  Mouth/Throat: Oropharynx is clear and moist.  Upper dentures.  Eyes: Conjunctivae and EOM are normal. Pupils are equal, round, and reactive to light.  Neck: Normal range of motion. Neck supple.  Cardiovascular: Normal rate, regular rhythm, normal heart sounds and intact distal pulses.   Pulmonary/Chest: Effort normal and breath sounds normal.  Thickening and tenderness on the left upper outer portion of breast  Abdominal: Soft. Bowel sounds are normal.  Genitourinary:  Genitourinary Comments: deffered  Musculoskeletal: Normal range of motion.  Neurological: She is alert and oriented to person, place, and time. She has normal reflexes.  Skin: Skin is warm and dry.  Psychiatric: She has a normal mood and affect. Her behavior is normal. Judgment and thought content normal.     Depression  Screen PHQ 2/9 Scores 09/13/2016 09/14/2015 08/31/2015  PHQ - 2 Score 0 0 0  PHQ- 9 Score 1 - -      Assessment & Plan:     Routine Health Maintenance and Physical Exam  Exercise Activities and Dietary recommendations Goals    None      Immunization History  Administered Date(s) Administered  . Influenza-Unspecified 03/11/2015  . Pneumococcal Polysaccharide-23 04/03/2016  . Td 04/13/2003  . Tdap 08/01/2012    Health Maintenance  Topic Date Due  . OPHTHALMOLOGY EXAM  03/21/1969  . HIV Screening  03/21/1974  . MAMMOGRAM  03/21/2009  . PAP SMEAR  07/28/2012  . HEMOGLOBIN A1C  10/02/2016  . FOOT EXAM  11/30/2016  . INFLUENZA VACCINE  01/03/2017  . COLONOSCOPY  11/15/2020  . PNEUMOCOCCAL POLYSACCHARIDE VACCINE (2) 04/03/2021  . TETANUS/TDAP  08/01/2022  . Hepatitis C Screening  Addressed     Discussed health benefits of physical activity, and encouraged her to engage in regular exercise appropriate for her age and condition.     -------------------------------------------------------------------- 1. Annual physical exam  - CBC with Differential/Platelet - Lipid Panel With LDL/HDL Ratio - TSH - Comprehensive metabolic panel - POCT urinalysis dipstick  2. Type 2 diabetes mellitus without complication, without long-term current use of insulin (HCC)  - Hemoglobin A1c - POCT UA - Microalbumin   HPI, Exam, and A&P Transcribed under the direction and in the presence of Josemanuel Eakins L. Cranford Mon, MD  Electronically Signed: Katina Dung, [place], MD  [facility]

## 2016-09-14 LAB — COMPREHENSIVE METABOLIC PANEL
A/G RATIO: 1.5 (ref 1.2–2.2)
ALBUMIN: 4.4 g/dL (ref 3.5–5.5)
ALT: 19 IU/L (ref 0–32)
AST: 19 IU/L (ref 0–40)
Alkaline Phosphatase: 55 IU/L (ref 39–117)
BILIRUBIN TOTAL: 0.4 mg/dL (ref 0.0–1.2)
BUN / CREAT RATIO: 21 (ref 9–23)
BUN: 12 mg/dL (ref 6–24)
CHLORIDE: 96 mmol/L (ref 96–106)
CO2: 29 mmol/L (ref 18–29)
Calcium: 9.7 mg/dL (ref 8.7–10.2)
Creatinine, Ser: 0.56 mg/dL — ABNORMAL LOW (ref 0.57–1.00)
GFR calc non Af Amer: 104 mL/min/{1.73_m2} (ref >59)
GFR, EST AFRICAN AMERICAN: 120 mL/min/{1.73_m2} (ref >59)
Globulin, Total: 2.9 g/dL (ref 1.5–4.5)
Glucose: 113 mg/dL — ABNORMAL HIGH (ref 65–99)
POTASSIUM: 4.7 mmol/L (ref 3.5–5.2)
Sodium: 140 mmol/L (ref 134–144)
TOTAL PROTEIN: 7.3 g/dL (ref 6.0–8.5)

## 2016-09-14 LAB — CBC WITH DIFFERENTIAL/PLATELET
BASOS ABS: 0 10*3/uL (ref 0.0–0.2)
Basos: 0 %
EOS (ABSOLUTE): 0.3 10*3/uL (ref 0.0–0.4)
Eos: 4 %
Hematocrit: 40 % (ref 34.0–46.6)
Hemoglobin: 13.3 g/dL (ref 11.1–15.9)
IMMATURE GRANS (ABS): 0 10*3/uL (ref 0.0–0.1)
Immature Granulocytes: 0 %
LYMPHS: 41 %
Lymphocytes Absolute: 3.1 10*3/uL (ref 0.7–3.1)
MCH: 30 pg (ref 26.6–33.0)
MCHC: 33.3 g/dL (ref 31.5–35.7)
MCV: 90 fL (ref 79–97)
MONOCYTES: 6 %
Monocytes Absolute: 0.5 10*3/uL (ref 0.1–0.9)
NEUTROS ABS: 3.7 10*3/uL (ref 1.4–7.0)
NEUTROS PCT: 49 %
PLATELETS: 278 10*3/uL (ref 150–379)
RBC: 4.43 x10E6/uL (ref 3.77–5.28)
RDW: 13.1 % (ref 12.3–15.4)
WBC: 7.7 10*3/uL (ref 3.4–10.8)

## 2016-09-14 LAB — LIPID PANEL WITH LDL/HDL RATIO
Cholesterol, Total: 170 mg/dL (ref 100–199)
HDL: 38 mg/dL — ABNORMAL LOW (ref >39)
LDL Calculated: 84 mg/dL (ref 0–99)
LDL/HDL RATIO: 2.2 ratio (ref 0.0–3.2)
TRIGLYCERIDES: 241 mg/dL — AB (ref 0–149)
VLDL Cholesterol Cal: 48 mg/dL — ABNORMAL HIGH (ref 5–40)

## 2016-09-14 LAB — TSH: TSH: 1.94 u[IU]/mL (ref 0.450–4.500)

## 2016-09-14 LAB — HEMOGLOBIN A1C
ESTIMATED AVERAGE GLUCOSE: 140 mg/dL
HEMOGLOBIN A1C: 6.5 % — AB (ref 4.8–5.6)

## 2016-10-31 ENCOUNTER — Other Ambulatory Visit: Payer: Self-pay | Admitting: Family Medicine

## 2016-10-31 DIAGNOSIS — G2581 Restless legs syndrome: Secondary | ICD-10-CM

## 2016-11-01 NOTE — Telephone Encounter (Signed)
Faxed Rx for Tramadol to [facility] @ [phone]. Thanks TNP

## 2016-11-14 ENCOUNTER — Other Ambulatory Visit: Payer: Self-pay | Admitting: Family Medicine

## 2016-12-13 ENCOUNTER — Other Ambulatory Visit: Payer: Self-pay | Admitting: Family Medicine

## 2016-12-13 DIAGNOSIS — E119 Type 2 diabetes mellitus without complications: Secondary | ICD-10-CM

## 2017-01-15 ENCOUNTER — Ambulatory Visit: Payer: BLUE CROSS/BLUE SHIELD | Admitting: Family Medicine

## 2017-01-19 ENCOUNTER — Encounter: Payer: Self-pay | Admitting: Family Medicine

## 2017-02-12 ENCOUNTER — Encounter: Payer: Self-pay | Admitting: Family Medicine

## 2017-02-12 ENCOUNTER — Ambulatory Visit (INDEPENDENT_AMBULATORY_CARE_PROVIDER_SITE_OTHER): Payer: BLUE CROSS/BLUE SHIELD | Admitting: Family Medicine

## 2017-02-12 VITALS — BP 120/58 | HR 82 | Temp 97.9°F | Resp 14 | Wt 182.0 lb

## 2017-02-12 DIAGNOSIS — E119 Type 2 diabetes mellitus without complications: Secondary | ICD-10-CM

## 2017-02-12 DIAGNOSIS — I10 Essential (primary) hypertension: Secondary | ICD-10-CM | POA: Diagnosis not present

## 2017-02-12 LAB — POCT GLYCOSYLATED HEMOGLOBIN (HGB A1C): Hemoglobin A1C: 6.6

## 2017-02-12 NOTE — Progress Notes (Signed)
Patient: Barbara Holmes Female    DOB: 06-23-1960   56 y.o.   MRN: [id number] Visit Date: 02/12/2017  Today's Provider: Wilhemena Durie, MD   Chief Complaint  Patient presents with  . Diabetes  . Hypertension   Subjective:    HPI   Diabetes Mellitus Type II, Follow-up:   Lab Results  Component Value Date   HGBA1C 6.5 (H) 09/13/2016   HGBA1C 6.4 04/03/2016   HGBA1C 6.9 12/01/2015    Last seen for diabetes 4 months ago.  Management since then includes none. She reports good compliance with treatment. She is not having side effects.  Home blood sugar records: 120's  Episodes of hypoglycemia? no   Current Insulin Regimen: n/a Most Recent Eye Exam: 01/17/2017 Current exercise: walking every other day.  Pertinent Labs:    Component Value Date/Time   CHOL 170 09/13/2016 1018   TRIG 241 (H) 09/13/2016 1018   HDL 38 (L) 09/13/2016 1018   LDLCALC 84 09/13/2016 1018   CREATININE 0.56 (L) 09/13/2016 1018    Wt Readings from Last 3 Encounters:  02/12/17 182 lb (82.6 kg)  09/13/16 181 lb (82.1 kg)  04/03/16 168 lb (76.2 kg)    ------------------------------------------------------------------------     No Known Allergies   Current Outpatient Prescriptions:  .  lansoprazole (PREVACID) 30 MG capsule, Take 30 mg by mouth daily as needed. , Disp: , Rfl:  .  lisinopril (PRINIVIL,ZESTRIL) 20 MG tablet, TAKE 1 TABLET BY MOUTH DAILY, Disp: 90 tablet, Rfl: 2 .  metFORMIN (GLUMETZA) 1000 MG (MOD) 24 hr tablet, TAKE 1 TABLET (1,000 MG TOTAL) BY MOUTH AT BEDTIME., Disp: 90 tablet, Rfl: 3 .  simvastatin (ZOCOR) 10 MG tablet, TAKE 1 TABLET BY MOUTH EVERY DAY, Disp: 90 tablet, Rfl: 2 .  traMADol (ULTRAM) 50 MG tablet, TAKE 1 TO 2 TABLETS BY MOUTH AT BEDTIME, Disp: 60 tablet, Rfl: 5 .  Cholecalciferol 1000 units capsule, Take 1,000 Units by mouth daily. , Disp: , Rfl:  .  naproxen (NAPROSYN) 500 MG tablet, Take 500 mg by mouth 2 (two) times daily as needed. , Disp: ,  Rfl:   Review of Systems  Constitutional: Negative.   HENT: Negative.   Eyes: Negative.   Respiratory: Negative.   Cardiovascular: Negative.   Gastrointestinal: Negative.   Endocrine: Negative.   Genitourinary: Negative.   Musculoskeletal: Negative.   Skin: Negative.   Allergic/Immunologic: Negative.   Neurological: Negative.   Hematological: Negative.   Psychiatric/Behavioral: Negative.     Social History  Substance Use Topics  . Smoking status: Former Smoker    Years: 3.00    Types: Cigarettes    Quit date: 06/05/1988  . Smokeless tobacco: Never Used     Comment: 1 cigarette per week  . Alcohol use No   Objective:   BP (!) 120/58 (BP Location: Left Arm, Patient Position: Sitting, Cuff Size: Large)   Pulse 82   Temp 97.9 F (36.6 C) (Oral)   Resp 14   Wt 182 lb (82.6 kg)   BMI 36.76 kg/m  Vitals:   02/12/17 1502  BP: (!) 120/58  Pulse: 82  Resp: 14  Temp: 97.9 F (36.6 C)  TempSrc: Oral  Weight: 182 lb (82.6 kg)     Physical Exam  Constitutional: She is oriented to person, place, and time. She appears well-developed and well-nourished.  Eyes: Pupils are equal, round, and reactive to light. Conjunctivae and EOM are normal.  Neck: Normal range of  motion. Neck supple.  Cardiovascular: Normal rate, regular rhythm, normal heart sounds and intact distal pulses.   Pulmonary/Chest: Effort normal and breath sounds normal.  Musculoskeletal: Normal range of motion.  Neurological: She is alert and oriented to person, place, and time. She has normal reflexes.  Skin: Skin is warm and dry.  Psychiatric: She has a normal mood and affect. Her behavior is normal. Judgment and thought content normal.   Diabetic Foot Exam - Simple   Simple Foot Form Diabetic Foot exam was performed with the following findings:  Yes 02/12/2017  3:34 PM  Visual Inspection No deformities, no ulcerations, no other skin breakdown bilaterally:  Yes Sensation Testing Intact to touch and  monofilament testing bilaterally:  Yes Pulse Check Posterior Tibialis and Dorsalis pulse intact bilaterally:  Yes Comments         Assessment & Plan:     1. Type 2 diabetes mellitus without complication, without long-term current use of insulin (HCC)  - POCT HgB A1C 6.6 today. Follow and instructed on diet and exercise. May add Actos in the future.  2. Essential (primary) hypertension Stable.  3.HLD 4.GERD 5.Obesity      HPI, Exam, and A&P Transcribed under the direction and in the presence of Richard L. Cranford Mon, MD  Electronically Signed: Katina Dung, CMA I have done the exam and reviewed the above chart and it is accurate to the best of my knowledge. Development worker, community has been used in this note in any air is in the dictation or transcription are unintentional.  Wilhemena Durie, MD  [place]

## 2017-06-13 ENCOUNTER — Ambulatory Visit: Payer: BLUE CROSS/BLUE SHIELD | Admitting: Family Medicine

## 2017-06-13 VITALS — BP 118/72 | HR 68 | Temp 98.2°F | Resp 14 | Wt 184.4 lb

## 2017-06-13 DIAGNOSIS — Z1231 Encounter for screening mammogram for malignant neoplasm of breast: Secondary | ICD-10-CM

## 2017-06-13 DIAGNOSIS — Z1239 Encounter for other screening for malignant neoplasm of breast: Secondary | ICD-10-CM

## 2017-06-13 DIAGNOSIS — E782 Mixed hyperlipidemia: Secondary | ICD-10-CM | POA: Diagnosis not present

## 2017-06-13 DIAGNOSIS — E119 Type 2 diabetes mellitus without complications: Secondary | ICD-10-CM

## 2017-06-13 DIAGNOSIS — I10 Essential (primary) hypertension: Secondary | ICD-10-CM | POA: Diagnosis not present

## 2017-06-13 MED ORDER — METFORMIN HCL 1000 MG PO TABS: 1000.0000 mg | ORAL_TABLET | Freq: Two times a day (BID) | ORAL | 3 refills | Status: DC

## 2017-06-13 NOTE — Progress Notes (Signed)
Barbara Holmes  MRN: [id number] DOB: 04-14-1961  Subjective:  HPI  Patient is here for follow up. Last office visit was on 02/12/17. Last routine lab work was done on 09/13/16. Diabetes: patient does check her sugar in the morning, readings are usually around 119-130, highest around 150. No hypoglycemic episodes. Patient states her insurance will not cover Metformin in any form anymore. Metformin is the only medication she has tried for her sugar. Lab Results  Component Value Date   HGBA1C 6.6 02/12/2017   Wt Readings from Last 3 Encounters:  06/13/17 184 lb 6.4 oz (83.6 kg)  02/12/17 182 lb (82.6 kg)  09/13/16 181 lb (82.1 kg)    Patient Active Problem List   Diagnosis Date Noted  . Diabetes mellitus, type 2 ([place]) 09/01/2015  . Arthritis 07/12/2015  . Borderline diabetes 07/12/2015  . Adiposity 07/12/2015  . Lesion of ulnar nerve 07/12/2015  . Restless leg 09/21/2009  . Allergic rhinitis 08/07/2009  . Avitaminosis D 04/05/2009  . Essential (primary) hypertension 11/04/2007  . HLD (hyperlipidemia) 11/04/2007  . Esophagitis, reflux 04/15/2007    Past Medical History:  Diagnosis Date  . Diabetes mellitus without complication ([place])   . Hyperlipidemia   . Hypertension   . Restless leg syndrome     Social History   Socioeconomic History  . Marital status: Married    Spouse name: Not on file  . Number of children: Not on file  . Years of education: Not on file  . Highest education level: Not on file  Social Needs  . Financial resource strain: Not on file  . Food insecurity - worry: Not on file  . Food insecurity - inability: Not on file  . Transportation needs - medical: Not on file  . Transportation needs - non-medical: Not on file  Occupational History  . Not on file  Tobacco Use  . Smoking status: Former Smoker    Years: 3.00    Types: Cigarettes    Last attempt to quit: 06/05/1988    Years since quitting: 29.0  . Smokeless tobacco: Never Used  . Tobacco  comment: 1 cigarette per week  Substance and Sexual Activity  . Alcohol use: No    Alcohol/week: 0.0 oz  . Drug use: No  . Sexual activity: Not on file  Other Topics Concern  . Not on file  Social History Narrative  . Not on file    Outpatient Encounter Medications as of 06/13/2017  Medication Sig Note  . Cholecalciferol 1000 units capsule Take 1,000 Units by mouth daily.  07/12/2015: Received from: [facility]  . lansoprazole (PREVACID) 30 MG capsule Take 30 mg by mouth daily as needed.  07/12/2015: Received from: [facility]  . lisinopril (PRINIVIL,ZESTRIL) 20 MG tablet TAKE 1 TABLET BY MOUTH DAILY   . metFORMIN (GLUMETZA) 1000 MG (MOD) 24 hr tablet TAKE 1 TABLET (1,000 MG TOTAL) BY MOUTH AT BEDTIME.   . naproxen (NAPROSYN) 500 MG tablet Take 500 mg by mouth 2 (two) times daily as needed.  07/12/2015: Medication taken as needed.  Received from: [facility]  . simvastatin (ZOCOR) 10 MG tablet TAKE 1 TABLET BY MOUTH EVERY DAY   . traMADol (ULTRAM) 50 MG tablet TAKE 1 TO 2 TABLETS BY MOUTH AT BEDTIME    No facility-administered encounter medications on file as of 06/13/2017.     No Known Allergies  Review of Systems  Constitutional: Negative.   Eyes: Negative.   Respiratory: Negative.  Cardiovascular: Negative.   Gastrointestinal: Positive for heartburn. Negative for constipation, nausea and vomiting.  Musculoskeletal: Negative.   Skin: Negative.   Neurological: Negative for dizziness and headaches.  Endo/Heme/Allergies: Negative.   Psychiatric/Behavioral: Negative.     Objective:  BP 118/72   Pulse 68   Temp 98.2 F (36.8 C)   Resp 14   Wt 184 lb 6.4 oz (83.6 kg)   BMI 37.24 kg/m   Physical Exam  Constitutional: She is oriented to person, place, and time and well-developed, well-nourished, and in no distress.  HENT:  Head: Normocephalic and atraumatic.  Eyes: Conjunctivae are normal. No scleral icterus.  Neck: No  thyromegaly present.  Cardiovascular: Normal rate, regular rhythm, normal heart sounds and intact distal pulses.  Pulmonary/Chest: Effort normal and breath sounds normal.  Neurological: She is alert and oriented to person, place, and time. Gait normal. GCS score is 15.  Skin: Skin is warm and dry.  Psychiatric: Mood, memory, affect and judgment normal.    Assessment and Plan :  1. Type 2 diabetes mellitus without complication, without long-term current use of insulin (HCC)  - POCT HgB A1C--6.4 today. - metFORMIN (GLUCOPHAGE) 1000 MG tablet; Take 1 tablet (1,000 mg total) by mouth 2 (two) times daily with a meal.  Dispense: 30 tablet; Refill: 3  2. Breast cancer screening  - MM SCREENING BREAST TOMO BILATERAL; Future  3. Essential (primary) hypertension 4.Obesity  I have done the exam and reviewed the chart and it is accurate to the best of my knowledge. Development worker, community has been used and  any errors in dictation or transcription are unintentional. Miguel Aschoff M.D. [facility]

## 2017-06-14 ENCOUNTER — Encounter: Payer: Self-pay | Admitting: Emergency Medicine

## 2017-06-14 LAB — POCT GLYCOSYLATED HEMOGLOBIN (HGB A1C): HEMOGLOBIN A1C: 6.4

## 2017-06-19 ENCOUNTER — Telehealth: Payer: Self-pay

## 2017-06-19 NOTE — Telephone Encounter (Signed)
error 

## 2017-06-22 ENCOUNTER — Other Ambulatory Visit: Payer: Self-pay | Admitting: Family Medicine

## 2017-06-22 DIAGNOSIS — G2581 Restless legs syndrome: Secondary | ICD-10-CM

## 2017-06-23 ENCOUNTER — Encounter: Payer: Self-pay | Admitting: Family Medicine

## 2017-06-27 ENCOUNTER — Other Ambulatory Visit: Payer: Self-pay

## 2017-06-27 MED ORDER — METFORMIN HCL ER 500 MG PO TB24: 1000.0000 mg | ORAL_TABLET | Freq: Every day | ORAL | 12 refills | Status: DC

## 2017-06-27 NOTE — Telephone Encounter (Signed)
Rx for traMADol (ULTRAM) 50 MG tablet was faxed to [facility] [address] Thanks TNP

## 2017-09-04 ENCOUNTER — Ambulatory Visit
Admission: RE | Admit: 2017-09-04 | Discharge: 2017-09-04 | Disposition: A | Discharge status: Home / Self Care | Payer: BLUE CROSS/BLUE SHIELD | Source: Ambulatory Visit | Attending: Family Medicine | Admitting: Family Medicine

## 2017-09-04 DIAGNOSIS — Z1231 Encounter for screening mammogram for malignant neoplasm of breast: Secondary | ICD-10-CM | POA: Insufficient documentation

## 2017-09-04 DIAGNOSIS — Z1239 Encounter for other screening for malignant neoplasm of breast: Secondary | ICD-10-CM

## 2017-09-09 ENCOUNTER — Other Ambulatory Visit: Payer: Self-pay | Admitting: Family Medicine

## 2017-09-26 ENCOUNTER — Ambulatory Visit (INDEPENDENT_AMBULATORY_CARE_PROVIDER_SITE_OTHER): Payer: BLUE CROSS/BLUE SHIELD | Admitting: Family Medicine

## 2017-09-26 ENCOUNTER — Encounter: Payer: Self-pay | Admitting: Family Medicine

## 2017-09-26 VITALS — BP 108/62 | HR 76 | Temp 97.6°F | Resp 16 | Ht 59.0 in | Wt 176.0 lb

## 2017-09-26 DIAGNOSIS — E119 Type 2 diabetes mellitus without complications: Secondary | ICD-10-CM

## 2017-09-26 DIAGNOSIS — Z0000 Encounter for general adult medical examination without abnormal findings: Secondary | ICD-10-CM

## 2017-09-26 DIAGNOSIS — Z1211 Encounter for screening for malignant neoplasm of colon: Secondary | ICD-10-CM | POA: Diagnosis not present

## 2017-09-26 DIAGNOSIS — I10 Essential (primary) hypertension: Secondary | ICD-10-CM

## 2017-09-26 DIAGNOSIS — Z124 Encounter for screening for malignant neoplasm of cervix: Secondary | ICD-10-CM

## 2017-09-26 DIAGNOSIS — E782 Mixed hyperlipidemia: Secondary | ICD-10-CM

## 2017-09-26 LAB — IFOBT (OCCULT BLOOD): IFOBT: NEGATIVE

## 2017-09-26 NOTE — Progress Notes (Signed)
Patient: Barbara Holmes, Female    DOB: 24-Nov-1960, 57 y.o.   MRN: [id number] Visit Date: 09/26/2017  Today's Provider: Wilhemena Durie, MD   Chief Complaint  Patient presents with  . Annual Exam   Subjective:    Annual physical exam Barbara Holmes is a 57 y.o. female who presents today for health maintenance and complete physical. She feels well. She reports she is not formally exercising. She reports she is sleeping fairly well.  ----------------------------------------------------------------- Colonoscopy- 11/16/10 [facility] Hyperplastic polyp Mammogram- 09/04/17 BMD- never Pap- s/p hysterectomy  Review of Systems  Constitutional: Negative.   HENT: Positive for ear pain, sinus pressure and sneezing.   Eyes: Positive for itching.  Respiratory: Negative.   Cardiovascular: Negative.   Gastrointestinal: Negative.   Endocrine: Negative.   Genitourinary: Negative.   Musculoskeletal: Positive for arthralgias.  Skin: Negative.   Allergic/Immunologic: Negative.   Neurological: Negative.   Hematological: Negative.   Psychiatric/Behavioral: Negative.     Social History      She  reports that she quit smoking about 29 years ago. Her smoking use included cigarettes. She quit after 3.00 years of use. She has never used smokeless tobacco. She reports that she does not drink alcohol or use drugs.       Social History   Socioeconomic History  . Marital status: Married    Spouse name: Not on file  . Number of children: Not on file  . Years of education: Not on file  . Highest education level: Not on file  Occupational History  . Not on file  Social Needs  . Financial resource strain: Not on file  . Food insecurity:    Worry: Not on file    Inability: Not on file  . Transportation needs:    Medical: Not on file    Non-medical: Not on file  Tobacco Use  . Smoking status: Former Smoker    Years: 3.00    Types: Cigarettes    Last attempt to quit: 06/05/1988   Years since quitting: 29.3  . Smokeless tobacco: Never Used  . Tobacco comment: 1 cigarette per week  Substance and Sexual Activity  . Alcohol use: No    Alcohol/week: 0.0 oz  . Drug use: No  . Sexual activity: Not on file  Lifestyle  . Physical activity:    Days per week: Not on file    Minutes per session: Not on file  . Stress: Not on file  Relationships  . Social connections:    Talks on phone: Not on file    Gets together: Not on file    Attends religious service: Not on file    Active member of club or organization: Not on file    Attends meetings of clubs or organizations: Not on file    Relationship status: Not on file  Other Topics Concern  . Not on file  Social History Narrative  . Not on file    Past Medical History:  Diagnosis Date  . Diabetes mellitus without complication ([place])   . Hyperlipidemia   . Hypertension   . Restless leg syndrome      Patient Active Problem List   Diagnosis Date Noted  . Diabetes mellitus, type 2 ([place]) 09/01/2015  . Arthritis 07/12/2015  . Borderline diabetes 07/12/2015  . Adiposity 07/12/2015  . Lesion of ulnar nerve 07/12/2015  . Restless leg 09/21/2009  . Allergic rhinitis 08/07/2009  . Avitaminosis D 04/05/2009  . Essential (  primary) hypertension 11/04/2007  . HLD (hyperlipidemia) 11/04/2007  . Esophagitis, reflux 04/15/2007    Past Surgical History:  Procedure Laterality Date  . ABDOMINAL HYSTERECTOMY     total  . BREAST EXCISIONAL BIOPSY Right 1999   benign  . FOOT SURGERY    . LAPAROSCOPY     endometriosis    Family History        Family Status  Relation Name Status  . Mother  Alive  . Father  Alive  . Sister  Alive  . Brother  Deceased  . MGM  Deceased  . Mat Aunt  (Not Specified)        Her family history includes Breast cancer (age of onset: 34) in her maternal aunt; Diabetes in her maternal grandmother, mother, and sister; Hyperlipidemia in her father, mother, and sister; Hypertension in her  father, mother, and sister; Skin cancer in her sister.      No Known Allergies   Current Outpatient Medications:  .  lansoprazole (PREVACID) 30 MG capsule, Take 30 mg by mouth daily as needed. , Disp: , Rfl:  .  lisinopril (PRINIVIL,ZESTRIL) 20 MG tablet, TAKE 1 TABLET BY MOUTH EVERY DAY, Disp: 90 tablet, Rfl: 2 .  metFORMIN (GLUCOPHAGE-XR) 500 MG 24 hr tablet, TAKE 2 TABLETS BY MOUTH EVERY DAY WITH BREAKFAST, Disp: 60 tablet, Rfl: 1 .  naproxen (NAPROSYN) 500 MG tablet, Take 500 mg by mouth 2 (two) times daily as needed. , Disp: , Rfl:  .  simvastatin (ZOCOR) 10 MG tablet, TAKE 1 TABLET BY MOUTH EVERY DAY, Disp: 90 tablet, Rfl: 2 .  traMADol (ULTRAM) 50 MG tablet, TAKE 1 TO 2 TABLETS BY MOUTH AT BEDTIME, Disp: 60 tablet, Rfl: 3 .  Cholecalciferol 1000 units capsule, Take 1,000 Units by mouth daily. , Disp: , Rfl:  .  metFORMIN (GLUCOPHAGE) 1000 MG tablet, Take 1 tablet (1,000 mg total) by mouth 2 (two) times daily with a meal. (Patient not taking: Reported on 09/26/2017), Disp: 30 tablet, Rfl: 3   Patient Care Team: Jerrol Banana., MD as PCP - General (Family Medicine)      Objective:   Vitals: BP 108/62 (BP Location: Left Arm, Patient Position: Sitting, Cuff Size: Large)   Pulse 76   Temp 97.6 F (36.4 C) (Oral)   Resp 16   Ht 4\' 11"  (1.499 m)   Wt 176 lb (79.8 kg)   BMI 35.55 kg/m    Vitals:   09/26/17 0856  BP: 108/62  Pulse: 76  Resp: 16  Temp: 97.6 F (36.4 C)  TempSrc: Oral  Weight: 176 lb (79.8 kg)  Height: 4\' 11"  (1.499 m)     Physical Exam  Constitutional: She is oriented to person, place, and time. She appears well-developed and well-nourished.  HENT:  Head: Normocephalic and atraumatic.  Right Ear: External ear normal.  Left Ear: External ear normal.  Nose: Nose normal.  Mouth/Throat: Oropharynx is clear and moist.  Eyes: Pupils are equal, round, and reactive to light. Conjunctivae and EOM are normal.  Neck: Normal range of motion. Neck supple.    Cardiovascular: Normal rate, regular rhythm, normal heart sounds and intact distal pulses.  Pulmonary/Chest: Effort normal and breath sounds normal.  Abdominal: Soft. Bowel sounds are normal.  Genitourinary: Uterus normal.  Genitourinary Comments: Breast exam normal.  Musculoskeletal: Normal range of motion.  Neurological: She is alert and oriented to person, place, and time.  Skin: Skin is warm and dry.  Psychiatric: She has a normal mood  and affect. Her behavior is normal. Judgment and thought content normal.     Depression Screen PHQ 2/9 Scores 09/26/2017 09/13/2016 09/14/2015 08/31/2015  PHQ - 2 Score 0 0 0 0  PHQ- 9 Score 0 1 - -      Assessment & Plan:     Routine Health Maintenance and Physical Exam  Exercise Activities and Dietary recommendations Goals    None      Immunization History  Administered Date(s) Administered  . Influenza Whole 03/14/2017  . Influenza-Unspecified 03/11/2015  . Pneumococcal Polysaccharide-23 04/03/2016  . Td 04/13/2003  . Tdap 08/01/2012    Health Maintenance  Topic Date Due  . HIV Screening  03/21/1974  . PAP SMEAR  07/28/2012  . OPHTHALMOLOGY EXAM  08/30/2017  . HEMOGLOBIN A1C  12/12/2017  . INFLUENZA VACCINE  01/03/2018  . FOOT EXAM  02/12/2018  . MAMMOGRAM  09/05/2019  . COLONOSCOPY  11/15/2020  . PNEUMOCOCCAL POLYSACCHARIDE VACCINE (2) 04/03/2021  . TETANUS/TDAP  08/01/2022  . Hepatitis C Screening  Addressed    Colonoscopy 2022. Discussed health benefits of physical activity, and encouraged her to engage in regular exercise appropriate for her age and condition.  RTC 6 months.   --------------------------------------------------------------------   I have done the exam and reviewed the above chart and it is accurate to the best of my knowledge. Development worker, community has been used in this note in any air is in the dictation or transcription are unintentional.  Wilhemena Durie, MD  [facility]

## 2017-09-27 LAB — CBC WITH DIFFERENTIAL/PLATELET
BASOS ABS: 0 10*3/uL (ref 0.0–0.2)
BASOS: 0 %
EOS (ABSOLUTE): 0.2 10*3/uL (ref 0.0–0.4)
Eos: 2 %
HEMOGLOBIN: 14 g/dL (ref 11.1–15.9)
Hematocrit: 41.5 % (ref 34.0–46.6)
IMMATURE GRANS (ABS): 0 10*3/uL (ref 0.0–0.1)
Immature Granulocytes: 0 %
LYMPHS ABS: 2.9 10*3/uL (ref 0.7–3.1)
Lymphs: 35 %
MCH: 29.9 pg (ref 26.6–33.0)
MCHC: 33.7 g/dL (ref 31.5–35.7)
MCV: 89 fL (ref 79–97)
MONOCYTES: 8 %
Monocytes Absolute: 0.6 10*3/uL (ref 0.1–0.9)
NEUTROS ABS: 4.4 10*3/uL (ref 1.4–7.0)
Neutrophils: 55 %
Platelets: 301 10*3/uL (ref 150–379)
RBC: 4.69 x10E6/uL (ref 3.77–5.28)
RDW: 13.7 % (ref 12.3–15.4)
WBC: 8.2 10*3/uL (ref 3.4–10.8)

## 2017-09-27 LAB — COMPREHENSIVE METABOLIC PANEL
A/G RATIO: 1.7 (ref 1.2–2.2)
ALK PHOS: 63 IU/L (ref 39–117)
ALT: 26 IU/L (ref 0–32)
AST: 22 IU/L (ref 0–40)
Albumin: 4.7 g/dL (ref 3.5–5.5)
BILIRUBIN TOTAL: 0.7 mg/dL (ref 0.0–1.2)
BUN/Creatinine Ratio: 23 (ref 9–23)
BUN: 12 mg/dL (ref 6–24)
CALCIUM: 9.8 mg/dL (ref 8.7–10.2)
CHLORIDE: 97 mmol/L (ref 96–106)
CO2: 26 mmol/L (ref 20–29)
Creatinine, Ser: 0.53 mg/dL — ABNORMAL LOW (ref 0.57–1.00)
GFR calc Af Amer: 121 mL/min/{1.73_m2} (ref >59)
GFR, EST NON AFRICAN AMERICAN: 105 mL/min/{1.73_m2} (ref >59)
GLOBULIN, TOTAL: 2.8 g/dL (ref 1.5–4.5)
Glucose: 120 mg/dL — ABNORMAL HIGH (ref 65–99)
POTASSIUM: 4.3 mmol/L (ref 3.5–5.2)
SODIUM: 139 mmol/L (ref 134–144)
Total Protein: 7.5 g/dL (ref 6.0–8.5)

## 2017-09-27 LAB — LIPID PANEL
CHOL/HDL RATIO: 5 ratio — AB (ref 0.0–4.4)
CHOLESTEROL TOTAL: 180 mg/dL (ref 100–199)
HDL: 36 mg/dL — ABNORMAL LOW (ref >39)
LDL CALC: 95 mg/dL (ref 0–99)
Triglycerides: 244 mg/dL — ABNORMAL HIGH (ref 0–149)
VLDL CHOLESTEROL CAL: 49 mg/dL — AB (ref 5–40)

## 2017-09-27 LAB — HEMOGLOBIN A1C
ESTIMATED AVERAGE GLUCOSE: 140 mg/dL
HEMOGLOBIN A1C: 6.5 % — AB (ref 4.8–5.6)

## 2017-09-27 LAB — TSH: TSH: 1.9 u[IU]/mL (ref 0.450–4.500)

## 2017-09-30 LAB — PAP IG (IMAGE GUIDED): PAP SMEAR COMMENT: 0

## 2017-10-01 ENCOUNTER — Telehealth: Payer: Self-pay

## 2017-10-01 NOTE — Telephone Encounter (Signed)
LMTCB 10/01/2017   Thanks,   -Mickel Baas

## 2017-10-01 NOTE — Telephone Encounter (Signed)
-----   Message from Jerrol Banana., MD sent at 09/30/2017  7:30 PM EDT ----- Pap ok.

## 2017-10-01 NOTE — Telephone Encounter (Signed)
-----   Message from Jerrol Banana., MD sent at 09/27/2017  4:02 PM EDT ----- Stable.

## 2017-10-02 NOTE — Telephone Encounter (Signed)
Patient advised as below.  

## 2017-10-04 DIAGNOSIS — H6501 Acute serous otitis media, right ear: Secondary | ICD-10-CM | POA: Diagnosis not present

## 2017-10-04 DIAGNOSIS — J209 Acute bronchitis, unspecified: Secondary | ICD-10-CM | POA: Diagnosis not present

## 2017-10-04 DIAGNOSIS — J0100 Acute maxillary sinusitis, unspecified: Secondary | ICD-10-CM | POA: Diagnosis not present

## 2017-12-03 DIAGNOSIS — R112 Nausea with vomiting, unspecified: Secondary | ICD-10-CM | POA: Diagnosis not present

## 2017-12-03 DIAGNOSIS — R42 Dizziness and giddiness: Secondary | ICD-10-CM | POA: Diagnosis not present

## 2018-01-06 ENCOUNTER — Other Ambulatory Visit: Payer: Self-pay | Admitting: Family Medicine

## 2018-01-06 DIAGNOSIS — G2581 Restless legs syndrome: Secondary | ICD-10-CM

## 2018-02-07 ENCOUNTER — Other Ambulatory Visit: Payer: Self-pay | Admitting: Physician Assistant

## 2018-02-07 DIAGNOSIS — G2581 Restless legs syndrome: Secondary | ICD-10-CM

## 2018-02-26 ENCOUNTER — Ambulatory Visit: Payer: BLUE CROSS/BLUE SHIELD | Admitting: Family Medicine

## 2018-02-26 VITALS — BP 114/62 | HR 90 | Temp 98.4°F | Resp 16

## 2018-02-26 DIAGNOSIS — R7303 Prediabetes: Secondary | ICD-10-CM | POA: Diagnosis not present

## 2018-02-26 DIAGNOSIS — I10 Essential (primary) hypertension: Secondary | ICD-10-CM

## 2018-02-26 NOTE — Progress Notes (Signed)
Barbara Holmes  MRN: [id number] DOB: September 05, 1960  Subjective:  HPI   The patient is a 57 year old female who presents for follow up of chronic health.  She was last seen on 09/26/17 for her annual physical.    Borderline diabetes-She had her last A1C during her last exam.  At that time it was 6.5.  The patient checks her glucose and she has been getting readings that range from 108-141.  The patient had her health fair at work and they checked her A1C on 02/13/18 and it was 6.5.  Hypertension-Her blood pressure has been stable.   BP Readings from Last 3 Encounters:  02/26/18 114/62  09/26/17 108/62  06/13/17 118/72     Patient Active Problem List   Diagnosis Date Noted  . Diabetes mellitus, type 2 ([place]) 09/01/2015  . Arthritis 07/12/2015  . Borderline diabetes 07/12/2015  . Adiposity 07/12/2015  . Lesion of ulnar nerve 07/12/2015  . Restless leg 09/21/2009  . Allergic rhinitis 08/07/2009  . Avitaminosis D 04/05/2009  . Essential (primary) hypertension 11/04/2007  . HLD (hyperlipidemia) 11/04/2007  . Esophagitis, reflux 04/15/2007    Past Medical History:  Diagnosis Date  . Diabetes mellitus without complication ([place])   . Hyperlipidemia   . Hypertension   . Restless leg syndrome     Social History   Socioeconomic History  . Marital status: Married    Spouse name: Not on file  . Number of children: Not on file  . Years of education: Not on file  . Highest education level: Not on file  Occupational History  . Not on file  Social Needs  . Financial resource strain: Not on file  . Food insecurity:    Worry: Not on file    Inability: Not on file  . Transportation needs:    Medical: Not on file    Non-medical: Not on file  Tobacco Use  . Smoking status: Former Smoker    Years: 3.00    Types: Cigarettes    Last attempt to quit: 06/05/1988    Years since quitting: 29.7  . Smokeless tobacco: Never Used  . Tobacco comment: 1 cigarette per week  Substance and  Sexual Activity  . Alcohol use: No    Alcohol/week: 0.0 standard drinks  . Drug use: No  . Sexual activity: Not on file  Lifestyle  . Physical activity:    Days per week: Not on file    Minutes per session: Not on file  . Stress: Not on file  Relationships  . Social connections:    Talks on phone: Not on file    Gets together: Not on file    Attends religious service: Not on file    Active member of club or organization: Not on file    Attends meetings of clubs or organizations: Not on file    Relationship status: Not on file  . Intimate partner violence:    Fear of current or ex partner: Not on file    Emotionally abused: Not on file    Physically abused: Not on file    Forced sexual activity: Not on file  Other Topics Concern  . Not on file  Social History Narrative  . Not on file    Outpatient Encounter Medications as of 02/26/2018  Medication Sig Note  . Cholecalciferol 1000 units capsule Take 1,000 Units by mouth daily.  07/12/2015: Received from: [facility]  . lansoprazole (PREVACID) 30 MG capsule Take 30 mg  by mouth daily as needed.  07/12/2015: Received from: [facility]  . lisinopril (PRINIVIL,ZESTRIL) 20 MG tablet TAKE 1 TABLET BY MOUTH EVERY DAY   . metFORMIN (GLUCOPHAGE-XR) 500 MG 24 hr tablet TAKE 2 TABLETS BY MOUTH EVERY DAY WITH BREAKFAST   . naproxen (NAPROSYN) 500 MG tablet Take 500 mg by mouth 2 (two) times daily as needed.  07/12/2015: Medication taken as needed.  Received from: [facility]  . simvastatin (ZOCOR) 10 MG tablet TAKE 1 TABLET BY MOUTH EVERY DAY   . traMADol (ULTRAM) 50 MG tablet TAKE 1 TO 2 TABLETS BY MOUTH EVERY DAY AT BEDTIME   . [DISCONTINUED] metFORMIN (GLUCOPHAGE) 1000 MG tablet Take 1 tablet (1,000 mg total) by mouth 2 (two) times daily with a meal. (Patient not taking: Reported on 09/26/2017)    No facility-administered encounter medications on file as of 02/26/2018.     No Known  Allergies  Review of Systems  Constitutional: Negative for fever and malaise/fatigue.  Respiratory: Negative for cough, shortness of breath and wheezing.   Cardiovascular: Negative for chest pain, palpitations, orthopnea, claudication and leg swelling.  Genitourinary: Negative for frequency.  Neurological: Negative for dizziness and headaches.  Endo/Heme/Allergies: Negative for polydipsia.    Objective:  BP 114/62 (BP Location: Right Arm, Patient Position: Sitting, Cuff Size: Normal)   Pulse 90   Temp 98.4 F (36.9 C) (Oral)   Resp 16   Physical Exam  Constitutional: She is oriented to person, place, and time and well-developed, well-nourished, and in no distress.  HENT:  Head: Normocephalic and atraumatic.  Right Ear: External ear normal.  Left Ear: External ear normal.  Nose: Nose normal.  Eyes: Conjunctivae are normal. No scleral icterus.  Neck: No thyromegaly present.  Cardiovascular: Normal rate, regular rhythm and normal heart sounds.  Pulmonary/Chest: Effort normal and breath sounds normal.  Abdominal: Soft.  Musculoskeletal: She exhibits no edema.  Neurological: She is alert and oriented to person, place, and time. Gait normal. GCS score is 15.  Skin: Skin is warm and dry.  Psychiatric: Mood, memory, affect and judgment normal.    Assessment and Plan :  1. Essential (primary) hypertension   2. Borderline diabetes 3.Obesity Great job with weight loss.  I have done the exam and reviewed the chart and it is accurate to the best of my knowledge. Development worker, community has been used and  any errors in dictation or transcription are unintentional. Miguel Aschoff M.D. [facility]

## 2018-05-22 ENCOUNTER — Other Ambulatory Visit: Payer: Self-pay | Admitting: Family Medicine

## 2018-07-01 DIAGNOSIS — M7711 Lateral epicondylitis, right elbow: Secondary | ICD-10-CM | POA: Diagnosis not present

## 2018-08-08 DIAGNOSIS — M7711 Lateral epicondylitis, right elbow: Secondary | ICD-10-CM | POA: Diagnosis not present

## 2018-08-12 ENCOUNTER — Other Ambulatory Visit: Payer: Self-pay | Admitting: Family Medicine

## 2018-08-12 DIAGNOSIS — G2581 Restless legs syndrome: Secondary | ICD-10-CM

## 2018-09-01 ENCOUNTER — Other Ambulatory Visit: Payer: Self-pay | Admitting: Family Medicine

## 2018-10-09 ENCOUNTER — Ambulatory Visit (INDEPENDENT_AMBULATORY_CARE_PROVIDER_SITE_OTHER): Payer: BLUE CROSS/BLUE SHIELD | Admitting: Family Medicine

## 2018-10-09 ENCOUNTER — Other Ambulatory Visit: Payer: Self-pay

## 2018-10-09 ENCOUNTER — Encounter: Payer: Self-pay | Admitting: Family Medicine

## 2018-10-09 VITALS — BP 112/78 | HR 72 | Temp 98.5°F | Resp 16 | Ht 59.5 in | Wt 179.0 lb

## 2018-10-09 DIAGNOSIS — E782 Mixed hyperlipidemia: Secondary | ICD-10-CM | POA: Diagnosis not present

## 2018-10-09 DIAGNOSIS — E119 Type 2 diabetes mellitus without complications: Secondary | ICD-10-CM | POA: Diagnosis not present

## 2018-10-09 DIAGNOSIS — I10 Essential (primary) hypertension: Secondary | ICD-10-CM

## 2018-10-09 DIAGNOSIS — Z0000 Encounter for general adult medical examination without abnormal findings: Secondary | ICD-10-CM

## 2018-10-09 NOTE — Progress Notes (Signed)
Patient: Barbara Holmes, Female    DOB: 13-Jun-1960, 58 y.o.   MRN: [id number] Visit Date: 10/09/2018  Today's Provider: Wilhemena Durie, MD   Chief Complaint  Patient presents with  . Annual Exam   Subjective:     Annual physical exam Glenda Spelman is a 58 y.o. female who presents today for health maintenance and complete physical. She feels well. She reports not exercising. She reports she is sleeping well.  -----------------------------------------------------------------   Review of Systems  Constitutional: Negative.   HENT: Positive for congestion and sneezing. Negative for dental problem, drooling, ear discharge, ear pain, facial swelling, hearing loss, mouth sores, nosebleeds, postnasal drip, rhinorrhea, sinus pressure, sinus pain, sore throat, tinnitus, trouble swallowing and voice change.   Eyes: Positive for itching. Negative for photophobia, pain, discharge, redness and visual disturbance.  Respiratory: Negative.   Cardiovascular: Negative.   Gastrointestinal: Negative.   Endocrine: Negative.   Genitourinary: Negative.   Musculoskeletal: Positive for arthralgias, back pain and joint swelling. Negative for gait problem, myalgias, neck pain and neck stiffness.  Skin: Negative.   Allergic/Immunologic: Negative.   Neurological: Negative.   Hematological: Negative.   Psychiatric/Behavioral: Negative.     Social History      She  reports that she quit smoking about 30 years ago. Her smoking use included cigarettes. She quit after 3.00 years of use. She has never used smokeless tobacco. She reports that she does not drink alcohol or use drugs.       Social History   Socioeconomic History  . Marital status: Married    Spouse name: Not on file  . Number of children: Not on file  . Years of education: Not on file  . Highest education level: Not on file  Occupational History  . Not on file  Social Needs  . Financial resource strain: Not on file  .  Food insecurity:    Worry: Not on file    Inability: Not on file  . Transportation needs:    Medical: Not on file    Non-medical: Not on file  Tobacco Use  . Smoking status: Former Smoker    Years: 3.00    Types: Cigarettes    Last attempt to quit: 06/05/1988    Years since quitting: 30.3  . Smokeless tobacco: Never Used  . Tobacco comment: 1 cigarette per week  Substance and Sexual Activity  . Alcohol use: No    Alcohol/week: 0.0 standard drinks  . Drug use: No  . Sexual activity: Not on file  Lifestyle  . Physical activity:    Days per week: Not on file    Minutes per session: Not on file  . Stress: Not on file  Relationships  . Social connections:    Talks on phone: Not on file    Gets together: Not on file    Attends religious service: Not on file    Active member of club or organization: Not on file    Attends meetings of clubs or organizations: Not on file    Relationship status: Not on file  Other Topics Concern  . Not on file  Social History Narrative  . Not on file    Past Medical History:  Diagnosis Date  . Diabetes mellitus without complication ([place])   . Hyperlipidemia   . Hypertension   . Restless leg syndrome      Patient Active Problem List   Diagnosis Date Noted  . Diabetes mellitus, type  2 ([place]) 09/01/2015  . Arthritis 07/12/2015  . Borderline diabetes 07/12/2015  . Adiposity 07/12/2015  . Lesion of ulnar nerve 07/12/2015  . Restless leg 09/21/2009  . Allergic rhinitis 08/07/2009  . Avitaminosis D 04/05/2009  . Essential (primary) hypertension 11/04/2007  . HLD (hyperlipidemia) 11/04/2007  . Esophagitis, reflux 04/15/2007    Past Surgical History:  Procedure Laterality Date  . ABDOMINAL HYSTERECTOMY     total  . BREAST EXCISIONAL BIOPSY Right 1999   benign  . FOOT SURGERY    . LAPAROSCOPY     endometriosis    Family History        Family Status  Relation Name Status  . Mother  Alive  . Father  Deceased  . Sister  Alive  .  Brother  Deceased  . MGM  Deceased  . Mat Aunt  (Not Specified)        Her family history includes Breast cancer (age of onset: 25) in her maternal aunt; Dementia in her mother; Diabetes in her maternal grandmother, mother, and sister; Hyperlipidemia in her father, mother, and sister; Hypertension in her father, mother, and sister.      No Known Allergies   Current Outpatient Medications:  .  Cholecalciferol 1000 units capsule, Take 1,000 Units by mouth daily. , Disp: , Rfl:  .  lansoprazole (PREVACID) 30 MG capsule, Take 30 mg by mouth daily as needed. , Disp: , Rfl:  .  lisinopril (PRINIVIL,ZESTRIL) 20 MG tablet, TAKE 1 TABLET BY MOUTH EVERY DAY, Disp: 30 tablet, Rfl: 8 .  meloxicam (MOBIC) 15 MG tablet, , Disp: , Rfl:  .  metFORMIN (GLUCOPHAGE-XR) 500 MG 24 hr tablet, TAKE 2 TABLETS BY MOUTH EVERY DAY WITH BREAKFAST, Disp: 60 tablet, Rfl: 11 .  simvastatin (ZOCOR) 10 MG tablet, TAKE 1 TABLET BY MOUTH EVERY DAY, Disp: 30 tablet, Rfl: 8 .  traMADol (ULTRAM) 50 MG tablet, TAKE 1 TO 2 TABLETS BY MOUTH EVERY DAY AT BEDTIME, Disp: 60 tablet, Rfl: 2 .  naproxen (NAPROSYN) 500 MG tablet, Take 500 mg by mouth 2 (two) times daily as needed. , Disp: , Rfl:    Patient Care Team: Jerrol Banana., MD as PCP - General (Family Medicine)    Objective:    Vitals: BP 112/78 (BP Location: Right Arm, Patient Position: Sitting, Cuff Size: Large)   Pulse 72   Temp 98.5 F (36.9 C) (Oral)   Resp 16   Ht 4' 11.5" (1.511 m)   Wt 179 lb (81.2 kg)   BMI 35.55 kg/m    Vitals:   10/09/18 0911  BP: 112/78  Pulse: 72  Resp: 16  Temp: 98.5 F (36.9 C)  TempSrc: Oral  Weight: 179 lb (81.2 kg)  Height: 4' 11.5" (1.511 m)     Physical Exam Constitutional:      Appearance: She is well-developed.  HENT:     Head: Normocephalic and atraumatic.     Right Ear: External ear normal.     Left Ear: External ear normal.     Nose: Nose normal.  Eyes:     Conjunctiva/sclera: Conjunctivae normal.      Pupils: Pupils are equal, round, and reactive to light.  Neck:     Musculoskeletal: Normal range of motion and neck supple.  Cardiovascular:     Rate and Rhythm: Normal rate and regular rhythm.     Heart sounds: Normal heart sounds.  Pulmonary:     Effort: Pulmonary effort is normal.  Breath sounds: Normal breath sounds.  Abdominal:     General: Bowel sounds are normal.     Palpations: Abdomen is soft.  Genitourinary:    Comments: Breast exam normal. Musculoskeletal: Normal range of motion.  Skin:    General: Skin is warm and dry.  Neurological:     Mental Status: She is alert and oriented to person, place, and time.  Psychiatric:        Behavior: Behavior normal.        Thought Content: Thought content normal.        Judgment: Judgment normal.      Depression Screen PHQ 2/9 Scores 10/09/2018 09/26/2017 09/13/2016 09/14/2015  PHQ - 2 Score 0 0 0 0  PHQ- 9 Score 0 0 1 -       Assessment & Plan:     Routine Health Maintenance and Physical Exam  Exercise Activities and Dietary recommendations Goals   None     Immunization History  Administered Date(s) Administered  . Influenza Whole 03/14/2017  . Influenza-Unspecified 03/11/2015  . Pneumococcal Polysaccharide-23 04/03/2016  . Td 04/13/2003  . Tdap 08/01/2012    Health Maintenance  Topic Date Due  . HIV Screening  03/21/1974  . OPHTHALMOLOGY EXAM  08/30/2017  . FOOT EXAM  02/12/2018  . HEMOGLOBIN A1C  03/28/2018  . INFLUENZA VACCINE  01/04/2019  . MAMMOGRAM  09/05/2019  . PAP SMEAR-Modifier  09/26/2020  . COLONOSCOPY  11/15/2020  . TETANUS/TDAP  08/01/2022  . PNEUMOCOCCAL POLYSACCHARIDE VACCINE AGE 49-64 HIGH RISK  Completed  . Hepatitis C Screening  Addressed     Discussed health benefits of physical activity, and encouraged her to engage in regular exercise appropriate for her age and condition.  1. Encounter for annual physical exam Colonoscopy and Pap 2022.   2. Essential (primary)  hypertension  - CBC with Differential/Platelet - TSH - Comprehensive metabolic panel  3. Type 2 diabetes mellitus without complication, without long-term current use of insulin (HCC)  - Hemoglobin A1c - Comprehensive metabolic panel  4. Mixed hyperlipidemia  - Lipid panel - Comprehensive metabolic panel    --------------------------------------------------------------------    Wilhemena Durie, MD  [facility]

## 2018-10-10 LAB — LIPID PANEL
Chol/HDL Ratio: 4.6 ratio — ABNORMAL HIGH (ref 0.0–4.4)
Cholesterol, Total: 174 mg/dL (ref 100–199)
HDL: 38 mg/dL — ABNORMAL LOW (ref >39)
LDL Calculated: 88 mg/dL (ref 0–99)
Triglycerides: 241 mg/dL — ABNORMAL HIGH (ref 0–149)
VLDL Cholesterol Cal: 48 mg/dL — ABNORMAL HIGH (ref 5–40)

## 2018-10-10 LAB — TSH: TSH: 1.42 u[IU]/mL (ref 0.450–4.500)

## 2018-10-10 LAB — COMPREHENSIVE METABOLIC PANEL
ALT: 17 IU/L (ref 0–32)
AST: 19 IU/L (ref 0–40)
Albumin/Globulin Ratio: 1.8 (ref 1.2–2.2)
Albumin: 4.4 g/dL (ref 3.8–4.9)
Alkaline Phosphatase: 64 IU/L (ref 39–117)
BUN/Creatinine Ratio: 27 — ABNORMAL HIGH (ref 9–23)
BUN: 17 mg/dL (ref 6–24)
Bilirubin Total: 0.6 mg/dL (ref 0.0–1.2)
CO2: 24 mmol/L (ref 20–29)
Calcium: 9.7 mg/dL (ref 8.7–10.2)
Chloride: 100 mmol/L (ref 96–106)
Creatinine, Ser: 0.63 mg/dL (ref 0.57–1.00)
GFR calc Af Amer: 114 mL/min/{1.73_m2} (ref >59)
GFR calc non Af Amer: 98 mL/min/{1.73_m2} (ref >59)
Globulin, Total: 2.4 g/dL (ref 1.5–4.5)
Glucose: 118 mg/dL — ABNORMAL HIGH (ref 65–99)
Potassium: 4.2 mmol/L (ref 3.5–5.2)
Sodium: 139 mmol/L (ref 134–144)
Total Protein: 6.8 g/dL (ref 6.0–8.5)

## 2018-10-10 LAB — CBC WITH DIFFERENTIAL/PLATELET
Basophils Absolute: 0.1 10*3/uL (ref 0.0–0.2)
Basos: 1 %
EOS (ABSOLUTE): 0.3 10*3/uL (ref 0.0–0.4)
Eos: 4 %
Hematocrit: 40.2 % (ref 34.0–46.6)
Hemoglobin: 13.2 g/dL (ref 11.1–15.9)
Immature Grans (Abs): 0 10*3/uL (ref 0.0–0.1)
Immature Granulocytes: 0 %
Lymphocytes Absolute: 2.8 10*3/uL (ref 0.7–3.1)
Lymphs: 39 %
MCH: 29 pg (ref 26.6–33.0)
MCHC: 32.8 g/dL (ref 31.5–35.7)
MCV: 88 fL (ref 79–97)
Monocytes Absolute: 0.5 10*3/uL (ref 0.1–0.9)
Monocytes: 7 %
Neutrophils Absolute: 3.5 10*3/uL (ref 1.4–7.0)
Neutrophils: 49 %
Platelets: 301 10*3/uL (ref 150–450)
RBC: 4.55 x10E6/uL (ref 3.77–5.28)
RDW: 12.5 % (ref 11.7–15.4)
WBC: 7.1 10*3/uL (ref 3.4–10.8)

## 2018-10-10 LAB — HEMOGLOBIN A1C
Est. average glucose Bld gHb Est-mCnc: 140 mg/dL
Hgb A1c MFr Bld: 6.5 % — ABNORMAL HIGH (ref 4.8–5.6)

## 2018-10-14 ENCOUNTER — Telehealth: Payer: Self-pay

## 2018-10-14 NOTE — Telephone Encounter (Signed)
-----   Message from Jerrol Banana., MD sent at 10/10/2018  5:32 PM EDT ----- Stable.

## 2018-10-14 NOTE — Telephone Encounter (Signed)
LMTCB 10/14/2018  Thanks,   -Mickel Baas

## 2018-10-15 ENCOUNTER — Other Ambulatory Visit: Payer: Self-pay | Admitting: Family Medicine

## 2018-10-15 MED ORDER — LISINOPRIL 10 MG PO TABS: 20.0000 mg | ORAL_TABLET | Freq: Every day | ORAL | 5 refills | Status: DC

## 2018-10-15 NOTE — Telephone Encounter (Signed)
Patient called and stated that [facility] was out of the Lisinopril 20 MG and wanted to know if they could just do the 10 MG daily. Please advise.

## 2018-10-15 NOTE — Telephone Encounter (Signed)
[facility] sent a fax requesting a new rx for lisinopril.  They are out of 20mg  tabs and are asking if you could prescribe lisinopril 10mg - take 2 tabs daily.  Please advise

## 2018-10-15 NOTE — Telephone Encounter (Signed)
Please review. Thanks!  

## 2018-10-16 NOTE — Telephone Encounter (Signed)
LVMTRC 

## 2018-10-17 NOTE — Telephone Encounter (Signed)
Patient was notified of results. Expressed understanding.  

## 2019-02-02 ENCOUNTER — Other Ambulatory Visit: Payer: Self-pay | Admitting: Family Medicine

## 2019-02-02 DIAGNOSIS — G2581 Restless legs syndrome: Secondary | ICD-10-CM

## 2019-03-03 ENCOUNTER — Other Ambulatory Visit: Payer: Self-pay | Admitting: Family Medicine

## 2019-04-16 NOTE — Progress Notes (Signed)
Patient: Barbara Holmes Female    DOB: 03-06-1961   58 y.o.   MRN: [id number] Visit Date: 04/17/2019  Today's Provider: Wilhemena Durie, MD   Chief Complaint  Patient presents with  . Diabetes  . Hypertension  . Hyperlipidemia   Subjective:     HPI   Type 2 diabetes mellitus without complication, without long-term current use of insulin ([place]) From 10/09/2018-Stable. HgB A1C--6.4.   Diabetes Mellitus Type II, Follow-up:   Lab Results  Component Value Date   HGBA1C 6.8 (A) 04/17/2019   HGBA1C 6.5 (H) 10/09/2018   HGBA1C 6.5 (H) 09/26/2017    Last seen for diabetes 6 months ago.  Management since then includes none. She reports excellent compliance with treatment. She is not having side effects.  Current symptoms include none and have been unchanged. Home blood sugar records: 126-150 non fasting  Episodes of hypoglycemia? no   Current insulin regiment: Is not on insulin Most Recent Eye Exam: 08/30/2016 Weight trend: stable Prior visit with dietician: No Current exercise: none Current diet habits: on average, 3 fast food meals per week  Pertinent Labs:    Component Value Date/Time   CHOL 174 10/09/2018 0949   TRIG 241 (H) 10/09/2018 0949   HDL 38 (L) 10/09/2018 0949   LDLCALC 88 10/09/2018 0949   CREATININE 0.63 10/09/2018 0949    Wt Readings from Last 3 Encounters:  04/17/19 184 lb 6.4 oz (83.6 kg)  10/09/18 179 lb (81.2 kg)  09/26/17 176 lb (79.8 kg)    -----------------------------------------------------------  Essential (primary) hypertension From 10/09/2018-labs checked, stable.  Hypertension, follow-up:  BP Readings from Last 3 Encounters:  04/17/19 107/74  10/09/18 112/78  02/26/18 114/62    She was last seen for hypertension 6 months ago.  BP at that visit was 112/78. Management changes since that visit include none. She reports excellent compliance with treatment. She is not having side effects.  She is not exercising.  She is not adherent to low salt diet.   Outside blood pressures are not being checked regularly. She is experiencing none.  Patient denies chest pain, chest pressure/discomfort, claudication, dyspnea, exertional chest pressure/discomfort, fatigue, irregular heart beat, lower extremity edema, near-syncope, orthopnea, palpitations, paroxysmal nocturnal dyspnea, syncope and tachypnea.   Cardiovascular risk factors include advanced age (older than 61 for men, 107 for women), diabetes mellitus, hypertension and obesity (BMI >= 30 kg/m2).  Use of agents associated with hypertension: none.     Weight trend: stable Wt Readings from Last 3 Encounters:  04/17/19 184 lb 6.4 oz (83.6 kg)  10/09/18 179 lb (81.2 kg)  09/26/17 176 lb (79.8 kg)    Current diet: in general, an "unhealthy" diet  -----------------------------------------------------------  Mixed Hyperlipidemia From 10/09/2018-labs checked, stable.   No Known Allergies   Current Outpatient Medications:  .  Cholecalciferol 1000 units capsule, Take 1,000 Units by mouth daily. , Disp: , Rfl:  .  lansoprazole (PREVACID) 30 MG capsule, Take 30 mg by mouth daily as needed. , Disp: , Rfl:  .  lisinopril (ZESTRIL) 20 MG tablet, Take 20 mg by mouth daily., Disp: , Rfl:  .  meloxicam (MOBIC) 15 MG tablet, , Disp: , Rfl:  .  metFORMIN (GLUCOPHAGE-XR) 500 MG 24 hr tablet, TAKE 2 TABLETS BY MOUTH EVERY DAY WITH BREAKFAST, Disp: 60 tablet, Rfl: 11 .  naproxen (NAPROSYN) 500 MG tablet, Take 500 mg by mouth 2 (two) times daily as needed. , Disp: , Rfl:  .  simvastatin (ZOCOR) 10 MG tablet, TAKE 1 TABLET BY MOUTH EVERY DAY, Disp: 30 tablet, Rfl: 12 .  traMADol (ULTRAM) 50 MG tablet, TAKE 1 TO 2 TABLETS BY MOUTH EVERY DAY AT BEDTIME, Disp: 60 tablet, Rfl: 2 .  albuterol (VENTOLIN HFA) 108 (90 Base) MCG/ACT inhaler, Ventolin HFA 90 mcg/actuation aerosol inhaler  INHALE 2 PUFFS BY MOUTH EVERY 4 HOURS AS NEEDED FOR COUGH OR WHEEZING, Disp: , Rfl:  .   meclizine (ANTIVERT) 25 MG tablet, meclizine 25 mg tablet  TAKE 1 TABLET BY MOUTH TWICE A DAY AS NEEDED, Disp: , Rfl:   Review of Systems  Constitutional: Negative for appetite change, chills, fatigue and fever.  Respiratory: Negative for chest tightness and shortness of breath.   Cardiovascular: Negative for chest pain and palpitations.  Gastrointestinal: Negative for abdominal pain, nausea and vomiting.  Neurological: Negative for dizziness and weakness.    Social History   Tobacco Use  . Smoking status: Former Smoker    Years: 3.00    Types: Cigarettes    Quit date: 06/05/1988    Years since quitting: 30.8  . Smokeless tobacco: Never Used  . Tobacco comment: 1 cigarette per week  Substance Use Topics  . Alcohol use: No    Alcohol/week: 0.0 standard drinks      Objective:   BP 107/74   Pulse 89   Temp (!) 96.9 F (36.1 C) (Oral)   Resp 16   Wt 184 lb 6.4 oz (83.6 kg)   BMI 36.62 kg/m  Vitals:   04/17/19 1624  BP: 107/74  Pulse: 89  Resp: 16  Temp: (!) 96.9 F (36.1 C)  TempSrc: Oral  Weight: 184 lb 6.4 oz (83.6 kg)  Body mass index is 36.62 kg/m.   Physical Exam Vitals signs reviewed.  Constitutional:      Appearance: She is well-developed.  HENT:     Head: Normocephalic and atraumatic.     Right Ear: External ear normal.     Left Ear: External ear normal.     Nose: Nose normal.  Eyes:     Conjunctiva/sclera: Conjunctivae normal.     Pupils: Pupils are equal, round, and reactive to light.  Neck:     Musculoskeletal: Normal range of motion and neck supple.  Cardiovascular:     Rate and Rhythm: Normal rate and regular rhythm.     Heart sounds: Normal heart sounds.  Pulmonary:     Effort: Pulmonary effort is normal.     Breath sounds: Normal breath sounds.  Abdominal:     General: Bowel sounds are normal.     Palpations: Abdomen is soft.  Genitourinary:    Comments: Breast exam normal. Musculoskeletal: Normal range of motion.  Skin:    General:  Skin is warm and dry.  Neurological:     Mental Status: She is alert and oriented to person, place, and time.  Psychiatric:        Behavior: Behavior normal.        Thought Content: Thought content normal.        Judgment: Judgment normal.      Results for orders placed or performed in visit on 04/17/19  POCT HgB A1C  Result Value Ref Range   Hemoglobin A1C 6.8 (A) 4.0 - 5.6 %   Est. average glucose Bld gHb Est-mCnc 148        Assessment & Plan    1. Type 2 diabetes mellitus without complication, without long-term current use of insulin ([place]) Diet  and exercise stressed. - POCT HgB A1C 6.8 today.  2. Essential (primary) hypertension Good control on lisinopril  3. Mixed hyperlipidemia Controlled on simvastatin   Follow up in January.     Aryaan Persichetti Cranford Mon, MD  [facility]

## 2019-04-17 ENCOUNTER — Other Ambulatory Visit: Payer: Self-pay

## 2019-04-17 ENCOUNTER — Ambulatory Visit (INDEPENDENT_AMBULATORY_CARE_PROVIDER_SITE_OTHER): Payer: BC Managed Care – PPO | Admitting: Family Medicine

## 2019-04-17 ENCOUNTER — Encounter: Payer: Self-pay | Admitting: Family Medicine

## 2019-04-17 VITALS — BP 107/74 | HR 89 | Temp 96.9°F | Resp 16 | Wt 184.4 lb

## 2019-04-17 DIAGNOSIS — E119 Type 2 diabetes mellitus without complications: Secondary | ICD-10-CM | POA: Diagnosis not present

## 2019-04-17 DIAGNOSIS — I10 Essential (primary) hypertension: Secondary | ICD-10-CM

## 2019-04-17 DIAGNOSIS — E782 Mixed hyperlipidemia: Secondary | ICD-10-CM

## 2019-04-17 LAB — POCT GLYCOSYLATED HEMOGLOBIN (HGB A1C)
Est. average glucose Bld gHb Est-mCnc: 148
Hemoglobin A1C: 6.8 % — AB (ref 4.0–5.6)

## 2019-05-04 ENCOUNTER — Other Ambulatory Visit: Payer: Self-pay | Admitting: Family Medicine

## 2019-06-08 ENCOUNTER — Other Ambulatory Visit: Payer: Self-pay | Admitting: Family Medicine

## 2019-06-08 DIAGNOSIS — G2581 Restless legs syndrome: Secondary | ICD-10-CM

## 2019-06-09 NOTE — Telephone Encounter (Signed)
Requested medication (s) are due for refill today: yes  Requested medication (s) are on the active medication list: yes  Last refill:  05/04/2019  Future visit scheduled: yes  Notes to clinic:  This refill cannot be delegated    Requested Prescriptions  Pending Prescriptions Disp Refills   traMADol (ULTRAM) 50 MG tablet [Pharmacy Med Name: TRAMADOL HCL 50 MG TABLET] 60 tablet 2    Sig: TAKE 1 TO 2 TABLETS BY MOUTH EVERY DAY AT BEDTIME      Not Delegated - Analgesics:  Opioid Agonists Failed - 06/08/2019  7:41 PM      Failed - This refill cannot be delegated      Failed - Urine Drug Screen completed in last 360 days.      Passed - Valid encounter within last 6 months    Recent Outpatient Visits           1 month ago Type 2 diabetes mellitus without complication, without long-term current use of insulin [facility])   [facility] Jerrol Banana., MD   8 months ago Encounter for annual physical exam   [facility] Jerrol Banana., MD   1 year ago Essential (primary) hypertension   [facility] Jerrol Banana., MD   1 year ago Annual physical exam   [facility] Jerrol Banana., MD   1 year ago Type 2 diabetes mellitus without complication, without long-term current use of insulin [facility])   [facility] Jerrol Banana., MD

## 2019-09-01 ENCOUNTER — Other Ambulatory Visit: Payer: Self-pay | Admitting: Family Medicine

## 2019-09-01 NOTE — Telephone Encounter (Signed)
Requested Prescriptions  Pending Prescriptions Disp Refills  . metFORMIN (GLUCOPHAGE-XR) 500 MG 24 hr tablet [Pharmacy Med Name: METFORMIN HCL ER 500 MG TABLET] 60 tablet 11    Sig: TAKE 2 TABLETS BY MOUTH EVERY DAY WITH BREAKFAST     Endocrinology:  Diabetes - Biguanides Passed - 09/01/2019  1:13 AM      Passed - Cr in normal range and within 360 days    Creatinine, Ser  Date Value Ref Range Status  10/09/2018 0.63 0.57 - 1.00 mg/dL Final         Passed - HBA1C is between 0 and 7.9 and within 180 days    Hemoglobin A1C  Date Value Ref Range Status  04/17/2019 6.8 (A) 4.0 - 5.6 % Final   Hgb A1c MFr Bld  Date Value Ref Range Status  10/09/2018 6.5 (H) 4.8 - 5.6 % Final    Comment:             Prediabetes: 5.7 - 6.4          Diabetes: >6.4          Glycemic control for adults with diabetes: <7.0          Passed - eGFR in normal range and within 360 days    GFR calc Af Amer  Date Value Ref Range Status  10/09/2018 114 >59 mL/min/1.73 Final   GFR calc non Af Amer  Date Value Ref Range Status  10/09/2018 98 >59 mL/min/1.73 Final         Passed - Valid encounter within last 6 months    Recent Outpatient Visits          4 months ago Type 2 diabetes mellitus without complication, without long-term current use of insulin [facility])   [facility] Jerrol Banana., MD   10 months ago Encounter for annual physical exam   [facility] Jerrol Banana., MD   1 year ago Essential (primary) hypertension   [facility] Jerrol Banana., MD   1 year ago Annual physical exam   [facility] Jerrol Banana., MD   2 years ago Type 2 diabetes mellitus without complication, without long-term current use of insulin [facility])   [facility] Jerrol Banana., MD

## 2019-10-17 NOTE — Progress Notes (Signed)
Complete physical exam  I,April Miller,acting as a scribe for Wilhemena Durie, MD.,have documented all relevant documentation on the behalf of Wilhemena Durie, MD,as directed by  Wilhemena Durie, MD while in the presence of Wilhemena Durie, MD.   Patient: Barbara Holmes   DOB: 1961-03-15   59 y.o. Female  MRN: [id number] Visit Date: 10/22/2019  Today's healthcare provider: Wilhemena Durie, MD   Chief Complaint  Patient presents with  . Annual Exam   Subjective    Barbara Holmes is a 59 y.o. female who presents today for a complete physical exam.  She reports consuming a general diet. The patient does not participate in regular exercise at present. She generally feels well. She reports sleeping well. She does not have additional problems to discuss today.  Mother died in September 07, 2017 and her mother died in 2018-09-08.  Is coping fairly well. HPI    Past Medical History:  Diagnosis Date  . Diabetes mellitus without complication ([place])   . Hyperlipidemia   . Hypertension   . Restless leg syndrome    Past Surgical History:  Procedure Laterality Date  . ABDOMINAL HYSTERECTOMY     total  . BREAST EXCISIONAL BIOPSY Right 1999   benign  . FOOT SURGERY    . LAPAROSCOPY     endometriosis   Social History   Socioeconomic History  . Marital status: Married    Spouse name: Not on file  . Number of children: Not on file  . Years of education: Not on file  . Highest education level: Not on file  Occupational History  . Not on file  Tobacco Use  . Smoking status: Former Smoker    Years: 3.00    Types: Cigarettes    Quit date: 06/05/1988    Years since quitting: 31.4  . Smokeless tobacco: Never Used  . Tobacco comment: 1 cigarette per week  Substance and Sexual Activity  . Alcohol use: No    Alcohol/week: 0.0 standard drinks  . Drug use: No  . Sexual activity: Not on file  Other Topics Concern  . Not on file  Social History Narrative  . Not on file   Social  Determinants of Health   Financial Resource Strain:   . Difficulty of Paying Living Expenses:   Food Insecurity:   . Worried About Charity fundraiser in the Last Year:   . Arboriculturist in the Last Year:   Transportation Needs:   . Film/video editor (Medical):   Marland Kitchen Lack of Transportation (Non-Medical):   Physical Activity:   . Days of Exercise per Week:   . Minutes of Exercise per Session:   Stress:   . Feeling of Stress :   Social Connections:   . Frequency of Communication with Friends and Family:   . Frequency of Social Gatherings with Friends and Family:   . Attends Religious Services:   . Active Member of Clubs or Organizations:   . Attends Archivist Meetings:   Marland Kitchen Marital Status:   Intimate Partner Violence:   . Fear of Current or Ex-Partner:   . Emotionally Abused:   Marland Kitchen Physically Abused:   . Sexually Abused:    Family Status  Relation Name Status  . Mother  Alive  . Father  Deceased  . Sister  Alive  . Brother  Deceased  . MGM  Deceased  . Mat Aunt  (Not Specified)   Family History  Problem Relation Age of Onset  . Hypertension Mother   . Hyperlipidemia Mother   . Diabetes Mother   . Dementia Mother   . Hypertension Father   . Hyperlipidemia Father   . Hypertension Sister   . Hyperlipidemia Sister   . Diabetes Sister   . Diabetes Maternal Grandmother   . Breast cancer Maternal Aunt 58   No Known Allergies  Patient Care Team: Jerrol Banana., MD as PCP - General (Family Medicine)   Medications: Outpatient Medications Prior to Visit  Medication Sig  . albuterol (VENTOLIN HFA) 108 (90 Base) MCG/ACT inhaler Ventolin HFA 90 mcg/actuation aerosol inhaler  INHALE 2 PUFFS BY MOUTH EVERY 4 HOURS AS NEEDED FOR COUGH OR WHEEZING  . Cholecalciferol 1000 units capsule Take 1,000 Units by mouth daily.   . lansoprazole (PREVACID) 30 MG capsule Take 30 mg by mouth daily as needed.   Marland Kitchen lisinopril (ZESTRIL) 20 MG tablet TAKE 1 TABLET BY MOUTH  EVERY DAY  . meclizine (ANTIVERT) 25 MG tablet meclizine 25 mg tablet  TAKE 1 TABLET BY MOUTH TWICE A DAY AS NEEDED  . meloxicam (MOBIC) 15 MG tablet   . metFORMIN (GLUCOPHAGE-XR) 500 MG 24 hr tablet TAKE 2 TABLETS BY MOUTH EVERY DAY WITH BREAKFAST  . naproxen (NAPROSYN) 500 MG tablet Take 500 mg by mouth 2 (two) times daily as needed.   . simvastatin (ZOCOR) 10 MG tablet TAKE 1 TABLET BY MOUTH EVERY DAY  . traMADol (ULTRAM) 50 MG tablet TAKE 1 TO 2 TABLETS BY MOUTH EVERY DAY AT BEDTIME   No facility-administered medications prior to visit.    Review of Systems  Constitutional: Negative.   HENT: Positive for sneezing.   Eyes: Positive for itching.  Respiratory: Negative.   Cardiovascular: Negative.   Endocrine: Negative.   Genitourinary: Negative.   Musculoskeletal: Positive for arthralgias.  Allergic/Immunologic: Negative.   Neurological: Negative.   Hematological: Negative.   Psychiatric/Behavioral: Negative.        Objective    BP 126/76 (BP Location: Left Arm, Patient Position: Sitting, Cuff Size: Large)   Pulse 73   Temp (!) 96.9 F (36.1 C) (Other (Comment))   Resp 16   Ht 5' (1.524 m)   Wt 185 lb (83.9 kg)   SpO2 95%   BMI 36.13 kg/m  BP Readings from Last 3 Encounters:  10/22/19 126/76  04/17/19 107/74  10/09/18 112/78   Wt Readings from Last 3 Encounters:  10/22/19 185 lb (83.9 kg)  04/17/19 184 lb 6.4 oz (83.6 kg)  10/09/18 179 lb (81.2 kg)      Physical Exam Vitals and nursing note reviewed.  Constitutional:      Appearance: Normal appearance. She is normal weight.  HENT:     Right Ear: Tympanic membrane normal.     Left Ear: Tympanic membrane normal.     Nose: Nose normal.     Mouth/Throat:     Mouth: Mucous membranes are moist.     Pharynx: Oropharynx is clear.  Eyes:     Conjunctiva/sclera: Conjunctivae normal.  Cardiovascular:     Rate and Rhythm: Normal rate and regular rhythm.     Pulses: Normal pulses.     Heart sounds: Normal  heart sounds.  Pulmonary:     Effort: Pulmonary effort is normal.     Breath sounds: Normal breath sounds.  Abdominal:     General: Bowel sounds are normal.     Palpations: Abdomen is soft.  Genitourinary:    General:  Normal vulva.  Musculoskeletal:        General: Normal range of motion.     Cervical back: Normal range of motion and neck supple.  Skin:    General: Skin is warm and dry.     Comments: Dermatitis of right leg.  Nonspecific  Neurological:     General: No focal deficit present.     Mental Status: She is alert and oriented to person, place, and time.  Psychiatric:        Mood and Affect: Mood normal.        Behavior: Behavior normal.        Thought Content: Thought content normal.        Judgment: Judgment normal.       Depression Screen  PHQ 2/9 Scores 10/22/2019 10/09/2018 09/26/2017  PHQ - 2 Score 0 0 0  PHQ- 9 Score 0 0 0    No results found for any visits on 10/22/19.  Assessment & Plan    Routine Health Maintenance and Physical Exam  Exercise Activities and Dietary recommendations Goals   None     Immunization History  Administered Date(s) Administered  . Influenza Whole 03/14/2017  . Influenza, Quadrivalent, Recombinant, Inj, Pf 03/08/2019  . Influenza,inj,Quad PF,6+ Mos 04/03/2018  . Influenza-Unspecified 03/11/2015  . Pneumococcal Polysaccharide-23 04/03/2016  . Td 04/13/2003  . Tdap 08/01/2012    Health Maintenance  Topic Date Due  . HIV Screening  Never done  . COVID-19 Vaccine (1) Never done  . OPHTHALMOLOGY EXAM  08/30/2017  . FOOT EXAM  02/12/2018  . MAMMOGRAM  09/05/2019  . HEMOGLOBIN A1C  10/15/2019  . INFLUENZA VACCINE  01/04/2020  . PAP SMEAR-Modifier  09/26/2020  . COLONOSCOPY  11/15/2020  . TETANUS/TDAP  08/01/2022  . PNEUMOCOCCAL POLYSACCHARIDE VACCINE AGE 43-64 HIGH RISK  Completed  . Hepatitis C Screening  Addressed    Discussed health benefits of physical activity, and encouraged her to engage in regular exercise  appropriate for her age and condition.  1. Annual physical exam  - CBC w/Diff/Platelet - Comprehensive Metabolic Panel (CMET) - TSH - Lipid panel - Hemoglobin A1C  2. Type 2 diabetes mellitus without complication, without long-term current use of insulin ([facility])  - CBC w/Diff/Platelet - Comprehensive Metabolic Panel (CMET) - TSH - Lipid panel - Hemoglobin A1C  3. Essential (primary) hypertension  - CBC w/Diff/Platelet - Comprehensive Metabolic Panel (CMET) - TSH - Lipid panel - Hemoglobin A1C  4. Mixed hyperlipidemia  - CBC w/Diff/Platelet - Comprehensive Metabolic Panel (CMET) - TSH - Lipid panel - Hemoglobin A1C  5. Borderline diabetes Return to clinic about 4 months. - CBC w/Diff/Platelet - Comprehensive Metabolic Panel (CMET) - TSH - Lipid panel - Hemoglobin A1C  6. Dermatitis  - Ambulatory referral to Dermatology   Return in about 4 months (around 02/22/2020).     I, Wilhemena Durie, MD, have reviewed all documentation for this visit. The documentation on 10/25/19 for the exam, diagnosis, procedures, and orders are all accurate and complete.    Onnika Siebel Cranford Mon, MD  [facility] [phone] (phone) [phone] (fax)  [place]

## 2019-10-22 ENCOUNTER — Encounter: Payer: Self-pay | Admitting: Family Medicine

## 2019-10-22 ENCOUNTER — Other Ambulatory Visit: Payer: Self-pay

## 2019-10-22 ENCOUNTER — Ambulatory Visit (INDEPENDENT_AMBULATORY_CARE_PROVIDER_SITE_OTHER): Payer: BC Managed Care – PPO | Admitting: Family Medicine

## 2019-10-22 VITALS — BP 126/76 | HR 73 | Temp 96.9°F | Resp 16 | Ht 60.0 in | Wt 185.0 lb

## 2019-10-22 DIAGNOSIS — E119 Type 2 diabetes mellitus without complications: Secondary | ICD-10-CM | POA: Diagnosis not present

## 2019-10-22 DIAGNOSIS — Z0000 Encounter for general adult medical examination without abnormal findings: Secondary | ICD-10-CM

## 2019-10-22 DIAGNOSIS — E782 Mixed hyperlipidemia: Secondary | ICD-10-CM

## 2019-10-22 DIAGNOSIS — I10 Essential (primary) hypertension: Secondary | ICD-10-CM

## 2019-10-22 DIAGNOSIS — R7303 Prediabetes: Secondary | ICD-10-CM

## 2019-10-22 DIAGNOSIS — L309 Dermatitis, unspecified: Secondary | ICD-10-CM

## 2019-10-22 NOTE — Patient Instructions (Signed)
Get Mammogram soon. Follow up in 3-4 months.

## 2019-10-23 LAB — CBC WITH DIFFERENTIAL/PLATELET
Basophils Absolute: 0 10*3/uL (ref 0.0–0.2)
Basos: 1 %
EOS (ABSOLUTE): 0.3 10*3/uL (ref 0.0–0.4)
Eos: 4 %
Hematocrit: 40.7 % (ref 34.0–46.6)
Hemoglobin: 13.5 g/dL (ref 11.1–15.9)
Immature Grans (Abs): 0 10*3/uL (ref 0.0–0.1)
Immature Granulocytes: 0 %
Lymphocytes Absolute: 2.6 10*3/uL (ref 0.7–3.1)
Lymphs: 34 %
MCH: 29.7 pg (ref 26.6–33.0)
MCHC: 33.2 g/dL (ref 31.5–35.7)
MCV: 90 fL (ref 79–97)
Monocytes Absolute: 0.5 10*3/uL (ref 0.1–0.9)
Monocytes: 7 %
Neutrophils Absolute: 4.2 10*3/uL (ref 1.4–7.0)
Neutrophils: 54 %
Platelets: 271 10*3/uL (ref 150–450)
RBC: 4.55 x10E6/uL (ref 3.77–5.28)
RDW: 12.5 % (ref 11.7–15.4)
WBC: 7.7 10*3/uL (ref 3.4–10.8)

## 2019-10-23 LAB — COMPREHENSIVE METABOLIC PANEL
ALT: 31 IU/L (ref 0–32)
AST: 29 IU/L (ref 0–40)
Albumin/Globulin Ratio: 1.8 (ref 1.2–2.2)
Albumin: 4.4 g/dL (ref 3.8–4.9)
Alkaline Phosphatase: 63 IU/L (ref 48–121)
BUN/Creatinine Ratio: 23 (ref 12–28)
BUN: 14 mg/dL (ref 8–27)
Bilirubin Total: 0.4 mg/dL (ref 0.0–1.2)
CO2: 25 mmol/L (ref 20–29)
Calcium: 9.7 mg/dL (ref 8.7–10.3)
Chloride: 98 mmol/L (ref 96–106)
Creatinine, Ser: 0.61 mg/dL (ref 0.57–1.00)
GFR calc Af Amer: 114 mL/min/{1.73_m2} (ref >59)
GFR calc non Af Amer: 99 mL/min/{1.73_m2} (ref >59)
Globulin, Total: 2.5 g/dL (ref 1.5–4.5)
Glucose: 123 mg/dL — ABNORMAL HIGH (ref 65–99)
Potassium: 4.4 mmol/L (ref 3.5–5.2)
Sodium: 137 mmol/L (ref 134–144)
Total Protein: 6.9 g/dL (ref 6.0–8.5)

## 2019-10-23 LAB — LIPID PANEL
Chol/HDL Ratio: 4.8 ratio — ABNORMAL HIGH (ref 0.0–4.4)
Cholesterol, Total: 178 mg/dL (ref 100–199)
HDL: 37 mg/dL — ABNORMAL LOW (ref >39)
LDL Chol Calc (NIH): 105 mg/dL — ABNORMAL HIGH (ref 0–99)
Triglycerides: 208 mg/dL — ABNORMAL HIGH (ref 0–149)
VLDL Cholesterol Cal: 36 mg/dL (ref 5–40)

## 2019-10-23 LAB — HEMOGLOBIN A1C
Est. average glucose Bld gHb Est-mCnc: 163 mg/dL
Hgb A1c MFr Bld: 7.3 % — ABNORMAL HIGH (ref 4.8–5.6)

## 2019-10-23 LAB — TSH: TSH: 1.28 u[IU]/mL (ref 0.450–4.500)

## 2019-10-24 ENCOUNTER — Telehealth: Payer: Self-pay

## 2019-10-24 ENCOUNTER — Encounter: Payer: Self-pay | Admitting: Family Medicine

## 2019-10-24 DIAGNOSIS — E782 Mixed hyperlipidemia: Secondary | ICD-10-CM

## 2019-10-24 MED ORDER — SIMVASTATIN 40 MG PO TABS: 40.0000 mg | ORAL_TABLET | Freq: Every day | ORAL | 12 refills | Status: DC

## 2019-10-24 NOTE — Telephone Encounter (Signed)
-----   Message from Jerrol Banana., MD sent at 10/24/2019 10:15 AM EDT ----- Labs stable but cholesterol up just a little.  Would increase Zocor from 10 to 40 mg daily

## 2019-10-24 NOTE — Telephone Encounter (Signed)
Patient given lab results through [place] and has read provider's comments.

## 2019-10-27 ENCOUNTER — Other Ambulatory Visit: Payer: Self-pay | Admitting: Family Medicine

## 2019-11-04 ENCOUNTER — Encounter: Payer: Self-pay | Admitting: Family Medicine

## 2019-11-06 DIAGNOSIS — L237 Allergic contact dermatitis due to plants, except food: Secondary | ICD-10-CM | POA: Diagnosis not present

## 2019-11-18 ENCOUNTER — Other Ambulatory Visit: Payer: Self-pay | Admitting: Family Medicine

## 2019-11-18 DIAGNOSIS — G2581 Restless legs syndrome: Secondary | ICD-10-CM

## 2019-11-18 DIAGNOSIS — Z1231 Encounter for screening mammogram for malignant neoplasm of breast: Secondary | ICD-10-CM

## 2019-11-18 MED ORDER — TRAMADOL HCL 50 MG PO TABS: ORAL_TABLET | ORAL | 2 refills | Status: DC

## 2019-11-18 NOTE — Telephone Encounter (Signed)
Please advise 

## 2019-11-18 NOTE — Telephone Encounter (Signed)
Refill sent to pharmacy.   

## 2019-11-18 NOTE — Telephone Encounter (Signed)
[facility] faxed refill request for the following medications:  traMADol (ULTRAM) 50 MG tablet   Please advise.  

## 2019-11-20 ENCOUNTER — Other Ambulatory Visit: Payer: Self-pay | Admitting: Family Medicine

## 2019-11-20 DIAGNOSIS — G2581 Restless legs syndrome: Secondary | ICD-10-CM

## 2019-12-01 ENCOUNTER — Ambulatory Visit
Admission: RE | Admit: 2019-12-01 | Discharge: 2019-12-01 | Disposition: A | Discharge status: Home / Self Care | Payer: BC Managed Care – PPO | Source: Ambulatory Visit | Attending: Family Medicine | Admitting: Family Medicine

## 2019-12-01 DIAGNOSIS — Z1231 Encounter for screening mammogram for malignant neoplasm of breast: Secondary | ICD-10-CM | POA: Insufficient documentation

## 2020-01-05 ENCOUNTER — Other Ambulatory Visit: Payer: Self-pay | Admitting: Family Medicine

## 2020-01-08 ENCOUNTER — Encounter: Payer: Self-pay | Admitting: Dermatology

## 2020-01-08 ENCOUNTER — Ambulatory Visit: Payer: BC Managed Care – PPO | Admitting: Dermatology

## 2020-01-08 ENCOUNTER — Other Ambulatory Visit: Payer: Self-pay

## 2020-01-08 DIAGNOSIS — L821 Other seborrheic keratosis: Secondary | ICD-10-CM

## 2020-01-08 DIAGNOSIS — L578 Other skin changes due to chronic exposure to nonionizing radiation: Secondary | ICD-10-CM

## 2020-01-08 DIAGNOSIS — D229 Melanocytic nevi, unspecified: Secondary | ICD-10-CM

## 2020-01-08 DIAGNOSIS — L820 Inflamed seborrheic keratosis: Secondary | ICD-10-CM | POA: Diagnosis not present

## 2020-01-08 DIAGNOSIS — L814 Other melanin hyperpigmentation: Secondary | ICD-10-CM

## 2020-01-08 DIAGNOSIS — Q828 Other specified congenital malformations of skin: Secondary | ICD-10-CM | POA: Diagnosis not present

## 2020-01-08 DIAGNOSIS — D1800 Hemangioma unspecified site: Secondary | ICD-10-CM

## 2020-01-08 DIAGNOSIS — Z808 Family history of malignant neoplasm of other organs or systems: Secondary | ICD-10-CM

## 2020-01-08 NOTE — Patient Instructions (Addendum)

## 2020-01-08 NOTE — Progress Notes (Signed)
   New Patient Visit  Subjective  Barbara Holmes is a 59 y.o. female who presents for the following: New Patient (Initial Visit).  Patient presents today as a new patient to establish care, has  some areas of concern on legs, face and on her arms she would like to have evaluated today. Grandmother has a skin cancer on her face about 50 yrs ago.  The following portions of the chart were reviewed this encounter and updated as appropriate:  Tobacco  Allergies  Meds  Problems  Med Hx  Surg Hx  Fam Hx      Review of Systems:  No other skin or systemic complaints except as noted in HPI or Assessment and Plan.  Objective  Well appearing patient in no apparent distress; mood and affect are within normal limits.  A focused examination was performed including extremities, including the arms, hands, fingers, and fingernails and the legs, feet, toes, and toenails, head, including the scalp, face, neck, nose, ears, eyelids, and lips and Back. Relevant physical exam findings are noted in the Assessment and Plan.  Objective  B/L  Lower Leg - Anterior: flesh-colored or reddish-brown round, scaly rings.  Objective  Right Mid Back: Erythematous keratotic or waxy stuck-on papule or plaque.   Objective  B/L lower leg, Right Forearm - Anterior: Stuck-on, waxy, tan-brown papule or plaque   Assessment & Plan  Porokeratosis B/L  Lower Leg - Anterior  Benign-appearing.  Observation.  Call clinic for new or changing areas.  Recommend daily use of broad spectrum spf 30+ sunscreen to sun-exposed areas.    Inflamed seborrheic keratosis Right Mid Back  Cryotherapy today Prior to procedure, discussed risks of blister formation, small wound, skin dyspigmentation, or rare scar following cryotherapy.    Destruction of lesion - Right Mid Back  Destruction method: cryotherapy   Informed consent: discussed and consent obtained   Lesion destroyed using liquid nitrogen: Yes   Outcome: patient  tolerated procedure well with no complications   Post-procedure details: wound care instructions given    Seborrheic keratosis (2) Right Forearm - Anterior; B/L lower leg  Benign-appearing.  Observation.  Call clinic for new or changing area.  Recommend daily use of broad spectrum spf 30+ sunscreen to sun-exposed areas.     Lentigines - Scattered tan macules - Discussed due to sun exposure - Benign, observe - Call for any changes  Actinic Damage - diffuse scaly erythematous macules with underlying dyspigmentation - Recommend daily broad spectrum sunscreen SPF 30+ to sun-exposed areas, reapply every 2 hours as needed.  - Call for new or changing lesions.  Hemangiomas - Red papules - Discussed benign nature - Observe - Call for any changes  Melanocytic Nevi - Tan-brown and/or pink-flesh-colored symmetric macules and papules - Benign appearing on exam today - Observation - Call clinic for new or changing moles - Recommend daily use of broad spectrum spf 30+ sunscreen to sun-exposed areas.     Return in about 1 year (around 01/07/2021) for FBSE.  I, Donzetta Kohut, CMA, am acting as scribe for Forest Gleason, MD .  Documentation: I have reviewed the above documentation for accuracy and completeness, and I agree with the above.  Forest Gleason, MD

## 2020-01-25 ENCOUNTER — Encounter: Payer: Self-pay | Admitting: Dermatology

## 2020-02-24 ENCOUNTER — Ambulatory Visit: Payer: Self-pay | Admitting: Family Medicine

## 2020-02-24 NOTE — Progress Notes (Signed)
Trena Platt Zsofia Prout,acting as a scribe for Wilhemena Durie, MD.,have documented all relevant documentation on the behalf of Wilhemena Durie, MD,as directed by  Wilhemena Durie, MD while in the presence of Wilhemena Durie, MD.   Established patient visit   Patient: Barbara Holmes   DOB: April 22, 1961   59 y.o. Female  MRN: [id number] Visit Date: 02/25/2020  Today's healthcare provider: Wilhemena Durie, MD   Chief Complaint  Patient presents with  . Diabetes  . Hyperlipidemia  . Hypertension   Subjective    HPI  Diabetes Mellitus Type II, follow-up  Lab Results  Component Value Date   HGBA1C 7.3 (H) 10/22/2019   HGBA1C 6.8 (A) 04/17/2019   HGBA1C 6.5 (H) 10/09/2018   Last seen for diabetes 3 months ago.  Management since then includes continuing the same treatment. She reports excellent compliance with treatment. She is not having side effects.   Home blood sugar records: fasting range: 152  Episodes of hypoglycemia? No    Current insulin regiment: None Most Recent Eye Exam: over a year ago  --------------------------------------------------------------------------------------------------- Hypertension, follow-up  BP Readings from Last 3 Encounters:  02/25/20 122/81  10/22/19 126/76  04/17/19 107/74   Wt Readings from Last 3 Encounters:  02/25/20 185 lb 6.4 oz (84.1 kg)  10/22/19 185 lb (83.9 kg)  04/17/19 184 lb 6.4 oz (83.6 kg)     She was last seen for hypertension 3 months ago.  BP at that visit was 126/76. Management since that visit includes; on lisinopril. She reports excellent compliance with treatment. She is not having side effects.  She is not exercising. She is not adherent to low salt diet.   Outside blood pressures are not being checked regularly.  She does not smoke.  Use of agents associated with hypertension: none.    --------------------------------------------------------------------------------------------------- Lipid/Cholesterol, follow-up  Last Lipid Panel: Lab Results  Component Value Date   CHOL 178 10/22/2019   LDLCALC 105 (H) 10/22/2019   HDL 37 (L) 10/22/2019   TRIG 208 (H) 10/22/2019    She was last seen for this 3 months ago.  Management since that visit includes; labs checked shwoing-cholesterol up just a little. Increased Zocor from 10 to 40 mg daily. She reports excellent compliance with treatment. She is not having side effects.  She is following a Regular diet. Current exercise: none  Last metabolic panel Lab Results  Component Value Date   GLUCOSE 123 (H) 10/22/2019   NA 137 10/22/2019   K 4.4 10/22/2019   BUN 14 10/22/2019   CREATININE 0.61 10/22/2019   GFRNONAA 99 10/22/2019   GFRAA 114 10/22/2019   CALCIUM 9.7 10/22/2019   AST 29 10/22/2019   ALT 31 10/22/2019   The 10-year ASCVD risk score Mikey Bussing DC Jr., et al., 2013) is: 9.1%  ---------------------------------------------------------------------------------------------------    Social History   Tobacco Use  . Smoking status: Former Smoker    Years: 3.00    Types: Cigarettes    Quit date: 06/05/1988    Years since quitting: 31.7  . Smokeless tobacco: Never Used  . Tobacco comment: 1 cigarette per week  Vaping Use  . Vaping Use: Never used  Substance Use Topics  . Alcohol use: No    Alcohol/week: 0.0 standard drinks  . Drug use: No       Medications: Outpatient Medications Prior to Visit  Medication Sig  . albuterol (VENTOLIN HFA) 108 (90 Base) MCG/ACT inhaler Ventolin HFA 90 mcg/actuation aerosol inhaler  INHALE 2 PUFFS BY MOUTH EVERY 4 HOURS AS NEEDED FOR COUGH OR WHEEZING  . Cholecalciferol 1000 units capsule Take 1,000 Units by mouth daily.   . lansoprazole (PREVACID) 30 MG capsule Take 30 mg by mouth daily as needed.   Marland Kitchen lisinopril (ZESTRIL) 20 MG tablet TAKE 1 TABLET BY MOUTH EVERY DAY   . meclizine (ANTIVERT) 25 MG tablet meclizine 25 mg tablet  TAKE 1 TABLET BY MOUTH TWICE A DAY AS NEEDED  . meloxicam (MOBIC) 15 MG tablet   . metFORMIN (GLUCOPHAGE-XR) 500 MG 24 hr tablet TAKE 2 TABLETS BY MOUTH EVERY DAY WITH BREAKFAST  . simvastatin (ZOCOR) 40 MG tablet Take 1 tablet (40 mg total) by mouth daily.  . traMADol (ULTRAM) 50 MG tablet TAKE 1 TO 2 TABLETS BY MOUTH EVERY DAY AT BEDTIME  . naproxen (NAPROSYN) 500 MG tablet Take 500 mg by mouth 2 (two) times daily as needed.  (Patient not taking: Reported on 01/08/2020)   No facility-administered medications prior to visit.    Review of Systems  Constitutional: Negative for appetite change, chills, fatigue and fever.  Respiratory: Negative for chest tightness and shortness of breath.   Cardiovascular: Negative for chest pain and palpitations.  Gastrointestinal: Negative for abdominal pain, nausea and vomiting.  Neurological: Negative for dizziness and weakness.       Objective    BP 122/81 (BP Location: Right Arm, Patient Position: Sitting, Cuff Size: Large)   Pulse 82   Temp 98.1 F (36.7 C) (Oral)   Ht 5' (1.524 m)   Wt 185 lb 6.4 oz (84.1 kg)   BMI 36.21 kg/m  BP Readings from Last 3 Encounters:  02/25/20 122/81  10/22/19 126/76  04/17/19 107/74   Wt Readings from Last 3 Encounters:  02/25/20 185 lb 6.4 oz (84.1 kg)  10/22/19 185 lb (83.9 kg)  04/17/19 184 lb 6.4 oz (83.6 kg)      Physical Exam Vitals reviewed.  Constitutional:      Appearance: She is well-developed.  HENT:     Head: Normocephalic and atraumatic.     Right Ear: External ear normal.     Left Ear: External ear normal.     Nose: Nose normal.  Eyes:     Conjunctiva/sclera: Conjunctivae normal.     Pupils: Pupils are equal, round, and reactive to light.  Cardiovascular:     Rate and Rhythm: Normal rate and regular rhythm.     Heart sounds: Normal heart sounds.  Pulmonary:     Effort: Pulmonary effort is normal.     Breath sounds:  Normal breath sounds.  Abdominal:     General: Bowel sounds are normal.     Palpations: Abdomen is soft.  Musculoskeletal:     Cervical back: Normal range of motion and neck supple.  Skin:    General: Skin is warm and dry.  Neurological:     Mental Status: She is alert and oriented to person, place, and time.  Psychiatric:        Mood and Affect: Mood normal.        Behavior: Behavior normal.        Thought Content: Thought content normal.        Judgment: Judgment normal.       No results found for any visits on 02/25/20.  Assessment & Plan     1. Type 2 diabetes mellitus without complication, without long-term current use of insulin (HCC) Good control of A1c with 7.0. - POCT HgB A1C  2. Mixed hyperlipidemia Check lipids on increased dose of Zocor from last visit  3. Essential (primary) hypertension Controlled on lisinopril  4. Class 2 severe obesity due to excess calories with serious comorbidity and body mass index (BMI) of 36.0 to 36.9 in adult [facility]) With diabetes hypertension hyperlipidemia and GERD  5. Avitaminosis D   No follow-ups on file.      I, Wilhemena Durie, MD, have reviewed all documentation for this visit. The documentation on 02/28/20 for the exam, diagnosis, procedures, and orders are all accurate and complete.    Richard Cranford Mon, MD  [facility] [phone] (phone) [phone] (fax)  [place]

## 2020-02-25 ENCOUNTER — Encounter: Payer: Self-pay | Admitting: Family Medicine

## 2020-02-25 ENCOUNTER — Ambulatory Visit: Payer: BC Managed Care – PPO | Admitting: Family Medicine

## 2020-02-25 ENCOUNTER — Other Ambulatory Visit: Payer: Self-pay

## 2020-02-25 VITALS — BP 122/81 | HR 82 | Temp 98.1°F | Ht 60.0 in | Wt 185.4 lb

## 2020-02-25 DIAGNOSIS — I10 Essential (primary) hypertension: Secondary | ICD-10-CM

## 2020-02-25 DIAGNOSIS — E782 Mixed hyperlipidemia: Secondary | ICD-10-CM

## 2020-02-25 DIAGNOSIS — E66812 Obesity, class 2: Secondary | ICD-10-CM

## 2020-02-25 DIAGNOSIS — E119 Type 2 diabetes mellitus without complications: Secondary | ICD-10-CM

## 2020-02-25 DIAGNOSIS — Z6836 Body mass index (BMI) 36.0-36.9, adult: Secondary | ICD-10-CM

## 2020-02-25 DIAGNOSIS — E559 Vitamin D deficiency, unspecified: Secondary | ICD-10-CM

## 2020-02-25 LAB — POCT GLYCOSYLATED HEMOGLOBIN (HGB A1C)
Estimated Average Glucose: 154
Hemoglobin A1C: 7 % — AB (ref 4.0–5.6)

## 2020-02-25 MED ORDER — STEGLATRO 5 MG PO TABS
5.0000 mg | ORAL_TABLET | Freq: Every day | ORAL | 5 refills | Status: DC
Start: 2020-02-25 — End: 2020-03-24

## 2020-03-01 ENCOUNTER — Other Ambulatory Visit: Payer: Self-pay

## 2020-03-01 DIAGNOSIS — E119 Type 2 diabetes mellitus without complications: Secondary | ICD-10-CM

## 2020-03-01 NOTE — Telephone Encounter (Signed)
Copied from [place] [id number]. Topic: General - Inquiry >> Mar 01, 2020 11:33 AM Gillis Ends D wrote: Reason for CRM: Patient called and stated that the pharmacy told her that she needs a prior authorization on Steglatro 5 mg and that the office needs to reach out to the insurance to get it covered. The patient was given samples but she only has 7 tabs left. She asks that you get the medication covered and if needed can she get some more samples. Please advise

## 2020-03-02 NOTE — Telephone Encounter (Signed)
Received PA request

## 2020-03-04 ENCOUNTER — Other Ambulatory Visit: Payer: Self-pay | Admitting: Family Medicine

## 2020-03-04 NOTE — Telephone Encounter (Signed)
Requested Prescriptions  Pending Prescriptions Disp Refills  . metFORMIN (GLUCOPHAGE-XR) 500 MG 24 hr tablet [Pharmacy Med Name: METFORMIN HCL ER 500 MG TABLET] 60 tablet 1    Sig: TAKE 2 TABLETS BY MOUTH EVERY DAY WITH BREAKFAST     Endocrinology:  Diabetes - Biguanides Passed - 03/04/2020  1:26 AM      Passed - Cr in normal range and within 360 days    Creatinine, Ser  Date Value Ref Range Status  10/22/2019 0.61 0.57 - 1.00 mg/dL Final         Passed - HBA1C is between 0 and 7.9 and within 180 days    Hemoglobin A1C  Date Value Ref Range Status  02/25/2020 7.0 (A) 4.0 - 5.6 % Final   Hgb A1c MFr Bld  Date Value Ref Range Status  10/22/2019 7.3 (H) 4.8 - 5.6 % Final    Comment:             Prediabetes: 5.7 - 6.4          Diabetes: >6.4          Glycemic control for adults with diabetes: <7.0          Passed - eGFR in normal range and within 360 days    GFR calc Af Amer  Date Value Ref Range Status  10/22/2019 114 >59 mL/min/1.73 Final    Comment:    **[facility] currently reports eGFR in compliance with the current**   recommendations of the [employer]. [facility] will   update reporting as new guidelines are published from the NKF-ASN   Task force.    GFR calc non Af Amer  Date Value Ref Range Status  10/22/2019 99 >59 mL/min/1.73 Final         Passed - Valid encounter within last 6 months    Recent Outpatient Visits          1 week ago Type 2 diabetes mellitus without complication, without long-term current use of insulin [facility])   [facility] Jerrol Banana., MD   4 months ago Annual physical exam   [facility] Jerrol Banana., MD   10 months ago Type 2 diabetes mellitus without complication, without long-term current use of insulin [facility])   [facility] Jerrol Banana., MD   1 year ago Encounter for annual physical exam   [facility] Jerrol Banana., MD   2 years  ago Essential (primary) hypertension   [facility] Jerrol Banana., MD      Future Appointments            In 3 months Jerrol Banana., MD [facility] [place]

## 2020-03-07 ENCOUNTER — Other Ambulatory Visit: Payer: Self-pay | Admitting: Family Medicine

## 2020-03-07 DIAGNOSIS — G2581 Restless legs syndrome: Secondary | ICD-10-CM

## 2020-03-07 NOTE — Telephone Encounter (Signed)
Requested medication (s) are due for refill today: yes  Requested medication (s) are on the active medication list: yes  Last refill:  11/18/19  Future visit scheduled: yes  Notes to clinic:  med not delegated to NT to RF   Requested Prescriptions  Pending Prescriptions Disp Refills   traMADol (ULTRAM) 50 MG tablet [Pharmacy Med Name: TRAMADOL HCL 50 MG TABLET] 60 tablet 2    Sig: TAKE 1 TO 2 TABLETS BY MOUTH EVERY DAY AT BEDTIME      Not Delegated - Analgesics:  Opioid Agonists Failed - 03/07/2020  9:45 AM      Failed - This refill cannot be delegated      Failed - Urine Drug Screen completed in last 360 days.      Passed - Valid encounter within last 6 months    Recent Outpatient Visits           1 week ago Type 2 diabetes mellitus without complication, without long-term current use of insulin [facility])   [facility] Jerrol Banana[facility] MD   4 months ago Annual physical exam   [facility] Jerrol Banana., MD   10 months ago Type 2 diabetes mellitus without complication, without long-term current use of insulin [facility])   [facility] Jerrol Banana., MD   1 year ago Encounter for annual physical exam   [facility] Jerrol Banana., MD   2 years ago Essential (primary) hypertension   [facility] Jerrol Banana., MD       Future Appointments             In 3 months Jerrol Banana., MD [facility] [place]

## 2020-03-08 NOTE — Telephone Encounter (Signed)
Pt is calling back in on this PA stating that she only has one pill left and asking what she needs to do while awaiting the PA from ins. CB at [phone]

## 2020-03-12 NOTE — Telephone Encounter (Signed)
PA is processing.

## 2020-03-17 NOTE — Telephone Encounter (Signed)
Pa was denied.

## 2020-03-23 NOTE — Telephone Encounter (Signed)
See of Wilder Glade or Vania Rea or Anastasio Auerbach are covered on her insurance.  Thank you

## 2020-03-23 NOTE — Telephone Encounter (Signed)
Invokana 300mg  every am. thanks

## 2020-03-23 NOTE — Telephone Encounter (Signed)
Farxiga and Invokana 300 mg is covered at 3/4 tier.

## 2020-03-24 MED ORDER — CANAGLIFLOZIN 300 MG PO TABS: 300.0000 mg | ORAL_TABLET | Freq: Every day | ORAL | 5 refills | Status: DC

## 2020-03-24 NOTE — Telephone Encounter (Signed)
Pt would like michelle to return her call at work number [phone] ext [phone]

## 2020-03-24 NOTE — Telephone Encounter (Signed)
LMOVM for pt to return call 

## 2020-03-24 NOTE — Telephone Encounter (Signed)
Rx sent to pharmacy   

## 2020-03-24 NOTE — Addendum Note (Signed)
Addended by: Julieta Bellini on: 03/24/2020 02:40 PM   Modules accepted: Orders

## 2020-03-24 NOTE — Addendum Note (Signed)
Addended by: Julieta Bellini on: 03/24/2020 09:02 AM   Modules accepted: Orders

## 2020-03-30 ENCOUNTER — Telehealth: Payer: Self-pay

## 2020-03-30 DIAGNOSIS — E119 Type 2 diabetes mellitus without complications: Secondary | ICD-10-CM

## 2020-03-30 NOTE — Telephone Encounter (Signed)
Copied from [place] [id number]. Topic: General - Other >> Mar 30, 2020  4:13 PM Leward Quan A wrote: Reason for CRM: Patient called in to speak to [facility] Dr Rosanna Randy nurse say that she was told by the pharmacy that she need a prior authorization for the canagliflozin (INVOKANA) 300 MG TABS tablet asking what to do next. Please call Ph# [phone]

## 2020-03-31 MED ORDER — DAPAGLIFLOZIN PROPANEDIOL 5 MG PO TABS: 5.0000 mg | ORAL_TABLET | Freq: Every day | ORAL | 1 refills | Status: DC

## 2020-03-31 NOTE — Telephone Encounter (Signed)
Returned call to patient. Per Dr. Rosanna Randy, sent in [place] 5 mg qd. We will see if patient's insurance will cover this, if not we will review other options.

## 2020-04-28 ENCOUNTER — Other Ambulatory Visit: Payer: Self-pay | Admitting: Family Medicine

## 2020-05-21 ENCOUNTER — Other Ambulatory Visit: Payer: Self-pay | Admitting: Family Medicine

## 2020-05-21 DIAGNOSIS — E119 Type 2 diabetes mellitus without complications: Secondary | ICD-10-CM

## 2020-06-09 ENCOUNTER — Other Ambulatory Visit: Payer: Self-pay

## 2020-06-09 ENCOUNTER — Encounter: Payer: Self-pay | Admitting: Family Medicine

## 2020-06-09 ENCOUNTER — Ambulatory Visit (INDEPENDENT_AMBULATORY_CARE_PROVIDER_SITE_OTHER): Payer: BC Managed Care – PPO | Admitting: Family Medicine

## 2020-06-09 VITALS — BP 120/86 | HR 80 | Temp 98.2°F | Resp 16 | Ht 60.0 in | Wt 176.6 lb

## 2020-06-09 DIAGNOSIS — E119 Type 2 diabetes mellitus without complications: Secondary | ICD-10-CM

## 2020-06-09 DIAGNOSIS — E6609 Other obesity due to excess calories: Secondary | ICD-10-CM | POA: Diagnosis not present

## 2020-06-09 DIAGNOSIS — E782 Mixed hyperlipidemia: Secondary | ICD-10-CM | POA: Diagnosis not present

## 2020-06-09 DIAGNOSIS — I10 Essential (primary) hypertension: Secondary | ICD-10-CM | POA: Diagnosis not present

## 2020-06-09 DIAGNOSIS — Z6834 Body mass index (BMI) 34.0-34.9, adult: Secondary | ICD-10-CM

## 2020-06-09 LAB — POCT GLYCOSYLATED HEMOGLOBIN (HGB A1C)
Est. average glucose Bld gHb Est-mCnc: 148
Hemoglobin A1C: 6.8 % — AB (ref 4.0–5.6)

## 2020-06-09 NOTE — Progress Notes (Signed)
Established patient visit   Patient: Barbara Holmes   DOB: Jul 05, 1960   60 y.o. Female  MRN: [id number] Visit Date: 06/09/2020  Today's healthcare provider: Wilhemena Durie, MD   Chief Complaint  Patient presents with  . Diabetes   Subjective    HPI  Overall patient feels well and has no complaints.  She is still working 6 days a week gets no regular exercise. Diabetes Mellitus Type II, Follow-up  Lab Results  Component Value Date   HGBA1C 6.8 (A) 06/09/2020   HGBA1C 7.0 (A) 02/25/2020   HGBA1C 7.3 (H) 10/22/2019   Wt Readings from Last 3 Encounters:  06/09/20 176 lb 9.6 oz (80.1 kg)  02/25/20 185 lb 6.4 oz (84.1 kg)  10/22/19 185 lb (83.9 kg)   Last seen for diabetes 3 months ago.  Management since then includes no changes. She reports excellent compliance with treatment. She is not having side effects.  Symptoms: No fatigue No foot ulcerations  No appetite changes No nausea  No paresthesia of the feet  No polydipsia  No polyuria No visual disturbances   No vomiting     Home blood sugar records: fasting range: 120's  Episodes of hypoglycemia? No    Current insulin regiment: none Most Recent Eye Exam: not up to date Current exercise: walking Current diet habits: in general, a "healthy" diet    Pertinent Labs: Lab Results  Component Value Date   CHOL 178 10/22/2019   HDL 37 (L) 10/22/2019   LDLCALC 105 (H) 10/22/2019   TRIG 208 (H) 10/22/2019   CHOLHDL 4.8 (H) 10/22/2019   Lab Results  Component Value Date   NA 137 10/22/2019   K 4.4 10/22/2019   CREATININE 0.61 10/22/2019   GFRNONAA 99 10/22/2019   GFRAA 114 10/22/2019   GLUCOSE 123 (H) 10/22/2019     ---------------------------------------------------------------------------------------------------   Patient Active Problem List   Diagnosis Date Noted  . Diabetes mellitus, type 2 ([place]) 09/01/2015  . Arthritis 07/12/2015  . Borderline diabetes 07/12/2015  . Adiposity 07/12/2015   . Lesion of ulnar nerve 07/12/2015  . Restless leg 09/21/2009  . Allergic rhinitis 08/07/2009  . Avitaminosis D 04/05/2009  . Essential (primary) hypertension 11/04/2007  . HLD (hyperlipidemia) 11/04/2007  . Esophagitis, reflux 04/15/2007   Social History   Tobacco Use  . Smoking status: Former Smoker    Years: 3.00    Types: Cigarettes    Quit date: 06/05/1988    Years since quitting: 32.0  . Smokeless tobacco: Never Used  . Tobacco comment: 1 cigarette per week  Vaping Use  . Vaping Use: Never used  Substance Use Topics  . Alcohol use: No    Alcohol/week: 0.0 standard drinks  . Drug use: No   No Known Allergies     Medications: Outpatient Medications Prior to Visit  Medication Sig  . albuterol (VENTOLIN HFA) 108 (90 Base) MCG/ACT inhaler Ventolin HFA 90 mcg/actuation aerosol inhaler  INHALE 2 PUFFS BY MOUTH EVERY 4 HOURS AS NEEDED FOR COUGH OR WHEEZING  . Cholecalciferol 1000 units capsule Take 1,000 Units by mouth daily.   Marland Kitchen FARXIGA 5 MG TABS tablet TAKE 1 TABLET BY MOUTH EVERY DAY  . lansoprazole (PREVACID) 30 MG capsule Take 30 mg by mouth daily as needed.   Marland Kitchen lisinopril (ZESTRIL) 20 MG tablet TAKE 1 TABLET BY MOUTH EVERY DAY  . meclizine (ANTIVERT) 25 MG tablet meclizine 25 mg tablet  TAKE 1 TABLET BY MOUTH TWICE A DAY  AS NEEDED  . meloxicam (MOBIC) 15 MG tablet   . metFORMIN (GLUCOPHAGE-XR) 500 MG 24 hr tablet TAKE 2 TABLETS BY MOUTH EVERY DAY WITH BREAKFAST  . naproxen (NAPROSYN) 500 MG tablet Take 500 mg by mouth 2 (two) times daily as needed.  . simvastatin (ZOCOR) 40 MG tablet Take 1 tablet (40 mg total) by mouth daily.  . traMADol (ULTRAM) 50 MG tablet TAKE 1 TO 2 TABLETS BY MOUTH EVERY DAY AT BEDTIME  . canagliflozin (INVOKANA) 300 MG TABS tablet Take 1 tablet (300 mg total) by mouth daily before breakfast. (Patient not taking: Reported on 06/09/2020)   No facility-administered medications prior to visit.    Review of Systems  Constitutional: Negative.    Eyes: Negative.   Respiratory: Negative.   Cardiovascular: Negative.   Endocrine: Negative.     Last CBC Lab Results  Component Value Date   WBC 7.7 10/22/2019   HGB 13.5 10/22/2019   HCT 40.7 10/22/2019   MCV 90 10/22/2019   MCH 29.7 10/22/2019   RDW 12.5 10/22/2019   PLT 271 10/22/2019   Last thyroid functions Lab Results  Component Value Date   TSH 1.280 10/22/2019     Objective    BP 120/86 (BP Location: Right Arm, Patient Position: Sitting, Cuff Size: Large)   Pulse 80   Temp 98.2 F (36.8 C) (Oral)   Resp 16   Ht 5' (1.524 m)   Wt 176 lb 9.6 oz (80.1 kg)   SpO2 96%   BMI 34.49 kg/m  BP Readings from Last 3 Encounters:  06/09/20 120/86  02/25/20 122/81  10/22/19 126/76   Wt Readings from Last 3 Encounters:  06/09/20 176 lb 9.6 oz (80.1 kg)  02/25/20 185 lb 6.4 oz (84.1 kg)  10/22/19 185 lb (83.9 kg)      Physical Exam Vitals reviewed.  Constitutional:      Appearance: She is well-developed.  HENT:     Head: Normocephalic and atraumatic.     Right Ear: External ear normal.     Left Ear: External ear normal.     Nose: Nose normal.  Eyes:     Conjunctiva/sclera: Conjunctivae normal.     Pupils: Pupils are equal, round, and reactive to light.  Cardiovascular:     Rate and Rhythm: Normal rate and regular rhythm.     Heart sounds: Normal heart sounds.  Pulmonary:     Effort: Pulmonary effort is normal.     Breath sounds: Normal breath sounds.  Abdominal:     General: Bowel sounds are normal.     Palpations: Abdomen is soft.  Musculoskeletal:     Cervical back: Normal range of motion and neck supple.  Skin:    General: Skin is warm and dry.  Neurological:     Mental Status: She is alert and oriented to person, place, and time.  Psychiatric:        Mood and Affect: Mood normal.        Behavior: Behavior normal.        Thought Content: Thought content normal.        Judgment: Judgment normal.     A1c stable at 6.8.  Results for orders  placed or performed in visit on 06/09/20  POCT glycosylated hemoglobin (Hb A1C)  Result Value Ref Range   Hemoglobin A1C 6.8 (A) 4.0 - 5.6 %   Est. average glucose Bld gHb Est-mCnc 148     Assessment & Plan     Problem List  Items Addressed This Visit      Cardiovascular and Mediastinum   Essential (primary) hypertension    Blood pressure controlled lisinopril 20 mg daily        Endocrine   Diabetes mellitus, type 2 (HCC) - Primary    Patient doing well on metformin and Farxiga.      Relevant Orders   POCT glycosylated hemoglobin (Hb A1C) (Completed)     Other   HLD (hyperlipidemia)    On simvastatin 40 mg daily , last LDL was 105, indicated rosuvastatin the same dose       Other Visit Diagnoses    Class 1 obesity due to excess calories with serious comorbidity and body mass index (BMI) of 34.0 to 34.9 in adult         Physical in the spring.  No follow-ups on file.      I, Megan Mans, MD, have reviewed all documentation for this visit. The documentation on 06/11/20 for the exam, diagnosis, procedures, and orders are all accurate and complete.    Lataria Courser Wendelyn Breslow, MD  [facility] [phone] (phone) [phone] (fax)  [facility]

## 2020-06-11 NOTE — Assessment & Plan Note (Signed)
On simvastatin 40 mg daily , last LDL was 105, indicated rosuvastatin the same dose

## 2020-06-11 NOTE — Assessment & Plan Note (Signed)
Blood pressure controlled lisinopril 20 mg daily

## 2020-06-11 NOTE — Assessment & Plan Note (Signed)
Patient doing well on metformin and [place].

## 2020-06-15 ENCOUNTER — Other Ambulatory Visit: Payer: Self-pay | Admitting: Family Medicine

## 2020-06-15 DIAGNOSIS — E119 Type 2 diabetes mellitus without complications: Secondary | ICD-10-CM

## 2020-07-14 ENCOUNTER — Other Ambulatory Visit: Payer: Self-pay | Admitting: Family Medicine

## 2020-07-14 DIAGNOSIS — G2581 Restless legs syndrome: Secondary | ICD-10-CM

## 2020-07-14 NOTE — Telephone Encounter (Signed)
Requested medications are due for refill today yes  Requested medications are on the active medication list yes  Last refill 1/5  Last visit 06/2020  Future visit scheduled 10/2020  Notes to clinic Not Delegated

## 2020-07-22 ENCOUNTER — Other Ambulatory Visit: Payer: Self-pay | Admitting: Family Medicine

## 2020-07-27 ENCOUNTER — Other Ambulatory Visit: Payer: Self-pay | Admitting: Family Medicine

## 2020-07-27 NOTE — Telephone Encounter (Signed)
Requested Prescriptions  Pending Prescriptions Disp Refills  . lisinopril (ZESTRIL) 20 MG tablet [Pharmacy Med Name: LISINOPRIL 20 MG TABLET] 30 tablet 2    Sig: TAKE 1 TABLET BY MOUTH EVERY DAY     Cardiovascular:  ACE Inhibitors Failed - 07/27/2020  1:22 AM      Failed - Cr in normal range and within 180 days    Creatinine, Ser  Date Value Ref Range Status  10/22/2019 0.61 0.57 - 1.00 mg/dL Final         Failed - K in normal range and within 180 days    Potassium  Date Value Ref Range Status  10/22/2019 4.4 3.5 - 5.2 mmol/L Final         Passed - Patient is not pregnant      Passed - Last BP in normal range    BP Readings from Last 1 Encounters:  06/09/20 120/86         Passed - Valid encounter within last 6 months    Recent Outpatient Visits          1 month ago Type 2 diabetes mellitus without complication, without long-term current use of insulin [facility])   [facility] Jerrol Banana., MD   5 months ago Type 2 diabetes mellitus without complication, without long-term current use of insulin [facility])   [facility] Jerrol Banana., MD   9 months ago Annual physical exam   [facility] Jerrol Banana., MD   1 year ago Type 2 diabetes mellitus without complication, without long-term current use of insulin [facility])   [facility] Jerrol Banana., MD   1 year ago Encounter for annual physical exam   [facility] Jerrol Banana., MD      Future Appointments            In 3 months Jerrol Banana., MD [facility]

## 2020-09-17 ENCOUNTER — Other Ambulatory Visit: Payer: Self-pay | Admitting: Family Medicine

## 2020-09-17 DIAGNOSIS — E119 Type 2 diabetes mellitus without complications: Secondary | ICD-10-CM

## 2020-10-19 ENCOUNTER — Other Ambulatory Visit: Payer: Self-pay | Admitting: Family Medicine

## 2020-10-20 ENCOUNTER — Other Ambulatory Visit: Payer: Self-pay | Admitting: Family Medicine

## 2020-10-26 NOTE — Progress Notes (Signed)
Complete physical exam   Patient: Barbara Holmes   DOB: Mar 02, 1961   60 y.o. Female  MRN: [id number] Visit Date: 10/27/2020  Today's healthcare provider: Wilhemena Durie, MD   Chief Complaint  Patient presents with  . Annual Exam   Subjective    Barbara Holmes is a 60 y.o. female who presents today for a complete physical exam.  She reports consuming a general diet. The patient does not participate in regular exercise at present. She generally feels well. She reports sleeping well. She does not have additional problems to discuss today.    Past Medical History:  Diagnosis Date  . Diabetes mellitus without complication ([place])   . Hyperlipidemia   . Hypertension   . Restless leg syndrome    Past Surgical History:  Procedure Laterality Date  . ABDOMINAL HYSTERECTOMY     total  . BREAST EXCISIONAL BIOPSY Right 1999   benign  . FOOT SURGERY    . LAPAROSCOPY     endometriosis   Social History   Socioeconomic History  . Marital status: Married    Spouse name: Not on file  . Number of children: Not on file  . Years of education: Not on file  . Highest education level: Not on file  Occupational History  . Not on file  Tobacco Use  . Smoking status: Former Smoker    Years: 3.00    Types: Cigarettes    Quit date: 06/05/1988    Years since quitting: 32.4  . Smokeless tobacco: Never Used  . Tobacco comment: 1 cigarette per week  Vaping Use  . Vaping Use: Never used  Substance and Sexual Activity  . Alcohol use: No    Alcohol/week: 0.0 standard drinks  . Drug use: No  . Sexual activity: Not on file  Other Topics Concern  . Not on file  Social History Narrative  . Not on file   Social Determinants of Health   Financial Resource Strain: Not on file  Food Insecurity: Not on file  Transportation Needs: Not on file  Physical Activity: Not on file  Stress: Not on file  Social Connections: Not on file  Intimate Partner Violence: Not on file   Family  Status  Relation Name Status  . Mother  Alive  . Father  Deceased  . Sister  Alive  . Brother  Deceased  . MGM  Deceased  . Mat Aunt  (Not Specified)   Family History  Problem Relation Age of Onset  . Hypertension Mother   . Hyperlipidemia Mother   . Diabetes Mother   . Dementia Mother   . Hypertension Father   . Hyperlipidemia Father   . Hypertension Sister   . Hyperlipidemia Sister   . Diabetes Sister   . Diabetes Maternal Grandmother   . Breast cancer Maternal Aunt 58   No Known Allergies  Patient Care Team: Jerrol Banana., MD as PCP - General (Family Medicine)   Medications: Outpatient Medications Prior to Visit  Medication Sig  . albuterol (VENTOLIN HFA) 108 (90 Base) MCG/ACT inhaler Ventolin HFA 90 mcg/actuation aerosol inhaler  INHALE 2 PUFFS BY MOUTH EVERY 4 HOURS AS NEEDED FOR COUGH OR WHEEZING  . Cholecalciferol 1000 units capsule Take 1,000 Units by mouth daily.   . meclizine (ANTIVERT) 25 MG tablet meclizine 25 mg tablet  TAKE 1 TABLET BY MOUTH TWICE A DAY AS NEEDED  . meloxicam (MOBIC) 15 MG tablet   . metFORMIN (GLUCOPHAGE-XR) 500  MG 24 hr tablet TAKE 2 TABLETS BY MOUTH EVERY DAY WITH BREAKFAST  . naproxen (NAPROSYN) 500 MG tablet Take 500 mg by mouth 2 (two) times daily as needed.  . simvastatin (ZOCOR) 40 MG tablet Take 1 tablet (40 mg total) by mouth daily.  . traMADol (ULTRAM) 50 MG tablet TAKE 1 TO 2 TABLETS BY MOUTH EVERY DAY AT BEDTIME  . canagliflozin (INVOKANA) 300 MG TABS tablet Take 1 tablet (300 mg total) by mouth daily before breakfast. (Patient not taking: No sig reported)  . FARXIGA 5 MG TABS tablet TAKE 1 TABLET BY MOUTH EVERY DAY  . lansoprazole (PREVACID) 30 MG capsule Take 30 mg by mouth daily as needed.   Marland Kitchen lisinopril (ZESTRIL) 20 MG tablet TAKE 1 TABLET BY MOUTH EVERY DAY   No facility-administered medications prior to visit.    Review of Systems  All other systems reviewed and are negative.      Objective    BP 111/64    Pulse 74   Temp 98.1 F (36.7 C)   Resp 16   Ht 4\' 11"  (1.499 m)   Wt 175 lb (79.4 kg)   BMI 35.35 kg/m  BP Readings from Last 3 Encounters:  10/27/20 111/64  06/09/20 120/86  02/25/20 122/81   Wt Readings from Last 3 Encounters:  10/27/20 175 lb (79.4 kg)  06/09/20 176 lb 9.6 oz (80.1 kg)  02/25/20 185 lb 6.4 oz (84.1 kg)      Physical Exam Exam conducted with a chaperone present.  HENT:     Head: Normocephalic and atraumatic.     Right Ear: Tympanic membrane normal.     Left Ear: Tympanic membrane normal.  Eyes:     Pupils: Pupils are equal, round, and reactive to light.  Cardiovascular:     Rate and Rhythm: Normal rate and regular rhythm.     Pulses: Normal pulses.     Heart sounds: Normal heart sounds.  Pulmonary:     Effort: Pulmonary effort is normal.     Breath sounds: Normal breath sounds.  Chest:  Breasts: Breasts are symmetrical.     Right: Normal. No swelling, bleeding, inverted nipple, mass, nipple discharge, skin change or tenderness.     Left: Normal. No swelling, bleeding, inverted nipple, mass, nipple discharge, skin change or tenderness.    Musculoskeletal:     Cervical back: Normal range of motion and neck supple.  Skin:    General: Skin is warm and dry.  Neurological:     Mental Status: She is alert and oriented to person, place, and time. Mental status is at baseline.  Psychiatric:        Mood and Affect: Mood normal.        Behavior: Behavior normal.        Thought Content: Thought content normal.        Judgment: Judgment normal.       Last depression screening scores PHQ 2/9 Scores 10/27/2020 02/25/2020 10/22/2019  PHQ - 2 Score 0 0 0  PHQ- 9 Score 0 0 0   Last fall risk screening Fall Risk  10/27/2020  Falls in the past year? 0  Comment -  Number falls in past yr: 0  Comment -  Injury with Fall? 0  Comment -  Risk for fall due to : No Fall Risks  Follow up Falls evaluation completed   Last Audit-C alcohol use  screening Alcohol Use Disorder Test (AUDIT) 10/27/2020  1. How often do you have  a drink containing alcohol? 0  2. How many drinks containing alcohol do you have on a typical day when you are drinking? 0  3. How often do you have six or more drinks on one occasion? 0  AUDIT-C Score 0   A score of 3 or more in women, and 4 or more in men indicates increased risk for alcohol abuse, EXCEPT if all of the points are from question 1   No results found for any visits on 10/27/20.  Assessment & Plan    Routine Health Maintenance and Physical Exam  Exercise Activities and Dietary recommendations Goals   None     Immunization History  Administered Date(s) Administered  . Influenza Whole 03/14/2017  . Influenza, Quadrivalent, Recombinant, Inj, Pf 03/08/2019  . Influenza,inj,Quad PF,6+ Mos 04/03/2018  . Influenza-Unspecified 03/11/2015, 01/31/2020  . Moderna Sars-Covid-2 Vaccination 12/30/2019, 01/27/2020  . Pneumococcal Polysaccharide-23 04/03/2016  . Td 04/13/2003  . Tdap 08/01/2012    Health Maintenance  Topic Date Due  . HIV Screening  Never done  . OPHTHALMOLOGY EXAM  08/30/2017  . FOOT EXAM  02/12/2018  . COVID-19 Vaccine (3 - Moderna risk 4-dose series) 02/24/2020  . PAP SMEAR-Modifier  09/26/2020  . COLONOSCOPY (Pts 45-61yrs Insurance coverage will need to be confirmed)  11/15/2020  . HEMOGLOBIN A1C  12/07/2020  . INFLUENZA VACCINE  01/03/2021  . MAMMOGRAM  11/30/2021  . TETANUS/TDAP  08/01/2022  . PNEUMOCOCCAL POLYSACCHARIDE VACCINE AGE 41-64 HIGH RISK  Completed  . Hepatitis C Screening  Addressed  . HPV VACCINES  Aged Out    Discussed health benefits of physical activity, and encouraged her to engage in regular exercise appropriate for her age and condition.  1. Annual physical exam Today.  2. Type 2 diabetes mellitus without complication, without long-term current use of insulin (HCC) Goal A1c less than 7 - Hemoglobin A1c - dapagliflozin propanediol (FARXIGA) 5  MG TABS tablet; Take 1 tablet (5 mg total) by mouth daily.  Dispense: 90 tablet; Refill: 3  3. Colon cancer screening Time for referral back to GI - Ambulatory referral to Gastroenterology  4. Mixed hyperlipidemia  - CBC with Differential/Platelet - Comprehensive metabolic panel - Lipid panel - TSH  5. Essential (primary) hypertension    No follow-ups on file.     I, Wilhemena Durie, MD, have reviewed all documentation for this visit. The documentation on 11/01/20 for the exam, diagnosis, procedures, and orders are all accurate and complete.    Toluwanimi Radebaugh Cranford Mon, MD  [facility] [phone] (phone) [phone] (fax)  [place]

## 2020-10-27 ENCOUNTER — Ambulatory Visit (INDEPENDENT_AMBULATORY_CARE_PROVIDER_SITE_OTHER): Payer: BC Managed Care – PPO | Admitting: Family Medicine

## 2020-10-27 ENCOUNTER — Encounter: Payer: Self-pay | Admitting: Family Medicine

## 2020-10-27 VITALS — BP 111/64 | HR 74 | Temp 98.1°F | Resp 16 | Ht 59.0 in | Wt 175.0 lb

## 2020-10-27 DIAGNOSIS — I10 Essential (primary) hypertension: Secondary | ICD-10-CM

## 2020-10-27 DIAGNOSIS — Z1211 Encounter for screening for malignant neoplasm of colon: Secondary | ICD-10-CM

## 2020-10-27 DIAGNOSIS — E119 Type 2 diabetes mellitus without complications: Secondary | ICD-10-CM

## 2020-10-27 DIAGNOSIS — E782 Mixed hyperlipidemia: Secondary | ICD-10-CM | POA: Diagnosis not present

## 2020-10-27 DIAGNOSIS — Z0000 Encounter for general adult medical examination without abnormal findings: Secondary | ICD-10-CM | POA: Diagnosis not present

## 2020-10-27 MED ORDER — DAPAGLIFLOZIN PROPANEDIOL 5 MG PO TABS: 5.0000 mg | ORAL_TABLET | Freq: Every day | ORAL | 3 refills | Status: DC

## 2020-10-28 LAB — CBC WITH DIFFERENTIAL/PLATELET
Basophils Absolute: 0 10*3/uL (ref 0.0–0.2)
Basos: 1 %
EOS (ABSOLUTE): 0.2 10*3/uL (ref 0.0–0.4)
Eos: 3 %
Hematocrit: 44.3 % (ref 34.0–46.6)
Hemoglobin: 14.7 g/dL (ref 11.1–15.9)
Immature Grans (Abs): 0 10*3/uL (ref 0.0–0.1)
Immature Granulocytes: 0 %
Lymphocytes Absolute: 2.9 10*3/uL (ref 0.7–3.1)
Lymphs: 36 %
MCH: 29.7 pg (ref 26.6–33.0)
MCHC: 33.2 g/dL (ref 31.5–35.7)
MCV: 90 fL (ref 79–97)
Monocytes Absolute: 0.5 10*3/uL (ref 0.1–0.9)
Monocytes: 7 %
Neutrophils Absolute: 4.4 10*3/uL (ref 1.4–7.0)
Neutrophils: 53 %
Platelets: 289 10*3/uL (ref 150–450)
RBC: 4.95 x10E6/uL (ref 3.77–5.28)
RDW: 12.4 % (ref 11.7–15.4)
WBC: 8.1 10*3/uL (ref 3.4–10.8)

## 2020-10-28 LAB — LIPID PANEL
Chol/HDL Ratio: 4.5 ratio — ABNORMAL HIGH (ref 0.0–4.4)
Cholesterol, Total: 176 mg/dL (ref 100–199)
HDL: 39 mg/dL — ABNORMAL LOW (ref >39)
LDL Chol Calc (NIH): 104 mg/dL — ABNORMAL HIGH (ref 0–99)
Triglycerides: 189 mg/dL — ABNORMAL HIGH (ref 0–149)
VLDL Cholesterol Cal: 33 mg/dL (ref 5–40)

## 2020-10-28 LAB — COMPREHENSIVE METABOLIC PANEL
ALT: 17 IU/L (ref 0–32)
AST: 19 IU/L (ref 0–40)
Albumin/Globulin Ratio: 1.7 (ref 1.2–2.2)
Albumin: 4.5 g/dL (ref 3.8–4.8)
Alkaline Phosphatase: 68 IU/L (ref 44–121)
BUN/Creatinine Ratio: 22 (ref 12–28)
BUN: 14 mg/dL (ref 8–27)
Bilirubin Total: 0.5 mg/dL (ref 0.0–1.2)
CO2: 26 mmol/L (ref 20–29)
Calcium: 9.8 mg/dL (ref 8.7–10.3)
Chloride: 98 mmol/L (ref 96–106)
Creatinine, Ser: 0.63 mg/dL (ref 0.57–1.00)
Globulin, Total: 2.7 g/dL (ref 1.5–4.5)
Glucose: 129 mg/dL — ABNORMAL HIGH (ref 65–99)
Potassium: 4.1 mmol/L (ref 3.5–5.2)
Sodium: 140 mmol/L (ref 134–144)
Total Protein: 7.2 g/dL (ref 6.0–8.5)
eGFR: 101 mL/min/{1.73_m2} (ref >59)

## 2020-10-28 LAB — TSH: TSH: 1.58 u[IU]/mL (ref 0.450–4.500)

## 2020-10-28 LAB — HEMOGLOBIN A1C
Est. average glucose Bld gHb Est-mCnc: 157 mg/dL
Hgb A1c MFr Bld: 7.1 % — ABNORMAL HIGH (ref 4.8–5.6)

## 2020-11-05 ENCOUNTER — Telehealth: Payer: Self-pay

## 2020-11-05 ENCOUNTER — Other Ambulatory Visit: Payer: Self-pay

## 2020-11-05 DIAGNOSIS — E782 Mixed hyperlipidemia: Secondary | ICD-10-CM

## 2020-11-05 MED ORDER — ROSUVASTATIN CALCIUM 40 MG PO TABS: 40.0000 mg | ORAL_TABLET | Freq: Every day | ORAL | 1 refills | Status: DC

## 2020-11-05 MED ORDER — PEG 3350-KCL-NA BICARB-NACL 420 G PO SOLR: ORAL | 0 refills | Status: DC

## 2020-11-05 NOTE — Telephone Encounter (Signed)
Gastroenterology Pre-Procedure Review  Request Date: 11/26/20 Requesting Physician: Dr. Vicente Males  PATIENT REVIEW QUESTIONS: The patient responded to the following health history questions as indicated:    1. Are you having any GI issues? no 2. Do you have a personal history of Polyps? no 3. Do you have a family history of Colon Cancer or Polyps? no 4. Diabetes Mellitus? yes (Type 2) 5. Joint replacements in the past 12 months?no 6. Major health problems in the past 3 months?no 7. Any artificial heart valves, MVP, or defibrillator?no    MEDICATIONS & ALLERGIES:    Patient reports the following regarding taking any anticoagulation/antiplatelet therapy:   Plavix, Coumadin, Eliquis, Xarelto, Lovenox, Pradaxa, Brilinta, or Effient? no Aspirin? no  Patient confirms/reports the following medications:  Current Outpatient Medications  Medication Sig Dispense Refill  . polyethylene glycol-electrolytes (GAVILYTE-N WITH FLAVOR PACK) 420 g solution Drink one 8 oz glass every 20 mins until entire container is finished starting at 5:00pm on 11/25/20 4000 mL 0  . albuterol (VENTOLIN HFA) 108 (90 Base) MCG/ACT inhaler Ventolin HFA 90 mcg/actuation aerosol inhaler  INHALE 2 PUFFS BY MOUTH EVERY 4 HOURS AS NEEDED FOR COUGH OR WHEEZING    . canagliflozin (INVOKANA) 300 MG TABS tablet Take 1 tablet (300 mg total) by mouth daily before breakfast. (Patient not taking: No sig reported) 30 tablet 5  . Cholecalciferol 1000 units capsule Take 1,000 Units by mouth daily.     . dapagliflozin propanediol (FARXIGA) 5 MG TABS tablet Take 1 tablet (5 mg total) by mouth daily. 90 tablet 3  . lansoprazole (PREVACID) 30 MG capsule Take 30 mg by mouth daily as needed.     Marland Kitchen lisinopril (ZESTRIL) 20 MG tablet TAKE 1 TABLET BY MOUTH EVERY DAY 90 tablet 0  . meclizine (ANTIVERT) 25 MG tablet meclizine 25 mg tablet  TAKE 1 TABLET BY MOUTH TWICE A DAY AS NEEDED    . meloxicam (MOBIC) 15 MG tablet     . metFORMIN (GLUCOPHAGE-XR)  500 MG 24 hr tablet TAKE 2 TABLETS BY MOUTH EVERY DAY WITH BREAKFAST 60 tablet 2  . naproxen (NAPROSYN) 500 MG tablet Take 500 mg by mouth 2 (two) times daily as needed.    . simvastatin (ZOCOR) 40 MG tablet Take 1 tablet (40 mg total) by mouth daily. 30 tablet 12  . traMADol (ULTRAM) 50 MG tablet TAKE 1 TO 2 TABLETS BY MOUTH EVERY DAY AT BEDTIME 60 tablet 2   No current facility-administered medications for this visit.    Patient confirms/reports the following allergies:  No Known Allergies  No orders of the defined types were placed in this encounter.   AUTHORIZATION INFORMATION Primary Insurance: 1D#: Group #:  Secondary Insurance: 1D#: Group #:  SCHEDULE INFORMATION: Date:  Time: Location:

## 2020-11-05 NOTE — Telephone Encounter (Signed)
-----   Message from Jerrol Banana., MD sent at 11/03/2020 10:05 AM EDT ----- Labs stable.  Continue to work on diet and exercise.  Would change simvastatin 40 to rosuvastatin 40

## 2020-11-05 NOTE — Telephone Encounter (Signed)
Patient advised. Prescription sent to pharmacy [place]

## 2020-11-18 ENCOUNTER — Other Ambulatory Visit: Payer: Self-pay | Admitting: Family Medicine

## 2020-11-18 DIAGNOSIS — Z1231 Encounter for screening mammogram for malignant neoplasm of breast: Secondary | ICD-10-CM

## 2020-11-18 DIAGNOSIS — E782 Mixed hyperlipidemia: Secondary | ICD-10-CM

## 2020-11-25 ENCOUNTER — Encounter: Payer: Self-pay | Admitting: Gastroenterology

## 2020-11-25 LAB — HM DIABETES EYE EXAM

## 2020-11-26 ENCOUNTER — Encounter
Admission: RE | Disposition: A | Discharge status: Home / Self Care | Payer: Self-pay | Source: Home / Self Care | Attending: Gastroenterology

## 2020-11-26 ENCOUNTER — Ambulatory Visit: Payer: BC Managed Care – PPO | Admitting: Certified Registered"

## 2020-11-26 ENCOUNTER — Encounter: Payer: Self-pay | Admitting: Gastroenterology

## 2020-11-26 ENCOUNTER — Ambulatory Visit
Admission: RE | Admit: 2020-11-26 | Discharge: 2020-11-26 | Disposition: A | Discharge status: Home / Self Care | Payer: BC Managed Care – PPO | Attending: Gastroenterology | Admitting: Gastroenterology

## 2020-11-26 DIAGNOSIS — Z1211 Encounter for screening for malignant neoplasm of colon: Secondary | ICD-10-CM | POA: Diagnosis not present

## 2020-11-26 DIAGNOSIS — Z87891 Personal history of nicotine dependence: Secondary | ICD-10-CM | POA: Diagnosis not present

## 2020-11-26 DIAGNOSIS — Z7984 Long term (current) use of oral hypoglycemic drugs: Secondary | ICD-10-CM | POA: Insufficient documentation

## 2020-11-26 DIAGNOSIS — Z803 Family history of malignant neoplasm of breast: Secondary | ICD-10-CM | POA: Insufficient documentation

## 2020-11-26 DIAGNOSIS — E785 Hyperlipidemia, unspecified: Secondary | ICD-10-CM | POA: Diagnosis not present

## 2020-11-26 DIAGNOSIS — Z79899 Other long term (current) drug therapy: Secondary | ICD-10-CM | POA: Diagnosis not present

## 2020-11-26 DIAGNOSIS — Z791 Long term (current) use of non-steroidal anti-inflammatories (NSAID): Secondary | ICD-10-CM | POA: Insufficient documentation

## 2020-11-26 DIAGNOSIS — K5730 Diverticulosis of large intestine without perforation or abscess without bleeding: Secondary | ICD-10-CM | POA: Diagnosis not present

## 2020-11-26 HISTORY — PX: COLONOSCOPY: SHX5424

## 2020-11-26 LAB — GLUCOSE, CAPILLARY: Glucose-Capillary: 142 mg/dL — ABNORMAL HIGH (ref 70–99)

## 2020-11-26 SURGERY — COLONOSCOPY
Anesthesia: General

## 2020-11-26 MED ORDER — PROPOFOL 500 MG/50ML IV EMUL
INTRAVENOUS | Status: DC | PRN
  Administered 2020-11-26: 150 ug/kg/min via INTRAVENOUS

## 2020-11-26 MED ORDER — PROPOFOL 10 MG/ML IV BOLUS
INTRAVENOUS | Status: DC | PRN
  Administered 2020-11-26: 80 mg via INTRAVENOUS

## 2020-11-26 MED ORDER — SODIUM CHLORIDE 0.9 % IV SOLN
INTRAVENOUS | Status: DC
  Administered 2020-11-26: 20 mL/h via INTRAVENOUS

## 2020-11-26 MED ORDER — PHENYLEPHRINE HCL (PRESSORS) 10 MG/ML IV SOLN
INTRAVENOUS | Status: AC
  Filled 2020-11-26: qty 1

## 2020-11-26 MED ORDER — LIDOCAINE HCL (CARDIAC) PF 100 MG/5ML IV SOSY
PREFILLED_SYRINGE | INTRAVENOUS | Status: DC | PRN
  Administered 2020-11-26: 30 mg via INTRAVENOUS

## 2020-11-26 MED ORDER — PROPOFOL 10 MG/ML IV BOLUS
INTRAVENOUS | Status: AC
  Filled 2020-11-26: qty 20

## 2020-11-26 NOTE — H&P (Signed)
Jonathon Bellows, MD [address] [place], [place], [place], [address] [address], [place], [place], [place], [address] Phone: [phone]  Fax: [phone]  Primary Care Physician:  Jerrol Banana., MD   Pre-Procedure History & Physical: HPI:  Barbara Holmes is a 60 y.o. female is here for an colonoscopy.   Past Medical History:  Diagnosis Date   Diabetes mellitus without complication ([place])    Hyperlipidemia    Hypertension    Restless leg syndrome     Past Surgical History:  Procedure Laterality Date   ABDOMINAL HYSTERECTOMY     total   BREAST EXCISIONAL BIOPSY Right 1999   benign   FOOT SURGERY     LAPAROSCOPY     endometriosis    Prior to Admission medications   Medication Sig Start Date End Date Taking? Authorizing Provider  albuterol (VENTOLIN HFA) 108 (90 Base) MCG/ACT inhaler Ventolin HFA 90 mcg/actuation aerosol inhaler  INHALE 2 PUFFS BY MOUTH EVERY 4 HOURS AS NEEDED FOR COUGH OR WHEEZING   Yes [provider]  Cholecalciferol 1000 units capsule Take 1,000 Units by mouth daily.    Yes [provider]  dapagliflozin propanediol (FARXIGA) 5 MG TABS tablet Take 1 tablet (5 mg total) by mouth daily. 10/27/20  Yes Jerrol Banana., MD  lansoprazole (PREVACID) 30 MG capsule Take 30 mg by mouth daily as needed.  01/19/14  Yes [provider]  lisinopril (ZESTRIL) 20 MG tablet TAKE 1 TABLET BY MOUTH EVERY DAY 10/20/20  Yes Jerrol Banana., MD  meclizine (ANTIVERT) 25 MG tablet meclizine 25 mg tablet  TAKE 1 TABLET BY MOUTH TWICE A DAY AS NEEDED   Yes [provider]  meloxicam (MOBIC) 15 MG tablet  10/01/18  Yes [provider]  metFORMIN (GLUCOPHAGE-XR) 500 MG 24 hr tablet TAKE 2 TABLETS BY MOUTH EVERY DAY WITH BREAKFAST 10/19/20  Yes Jerrol Banana., MD  naproxen (NAPROSYN) 500 MG tablet Take 500 mg by mouth 2 (two) times daily as needed. 08/12/14  Yes [provider]  polyethylene  glycol-electrolytes (GAVILYTE-N WITH FLAVOR PACK) 420 g solution Drink one 8 oz glass every 20 mins until entire container is finished starting at 5:00pm on 11/25/20 11/05/20  Yes Jonathon Bellows, MD  rosuvastatin (CRESTOR) 40 MG tablet Take 1 tablet (40 mg total) by mouth daily. 11/05/20  Yes Jerrol Banana., MD  simvastatin (ZOCOR) 40 MG tablet Take 1 tablet (40 mg total) by mouth daily. 10/24/19  Yes Jerrol Banana., MD  traMADol (ULTRAM) 50 MG tablet TAKE 1 TO 2 TABLETS BY MOUTH EVERY DAY AT BEDTIME 07/15/20  Yes Jerrol Banana., MD  canagliflozin [facility]) 300 MG TABS tablet Take 1 tablet (300 mg total) by mouth daily before breakfast. Patient not taking: No sig reported 03/24/20   Jerrol Banana., MD    Allergies as of 11/04/2020   (No Known Allergies)    Family History  Problem Relation Age of Onset   Hypertension Mother    Hyperlipidemia Mother    Diabetes Mother    Dementia Mother    Hypertension Father    Hyperlipidemia Father    Hypertension Sister    Hyperlipidemia Sister    Diabetes Sister    Diabetes Maternal Grandmother    Breast cancer Maternal Aunt 58    Social History   Socioeconomic History   Marital status: Married    Spouse name: Not on file  Number of children: Not on file   Years of education: Not on file   Highest education level: Not on file  Occupational History   Not on file  Tobacco Use   Smoking status: Former    Years: 3.00    Pack years: 0.00    Types: Cigarettes    Quit date: 06/05/1988    Years since quitting: 32.4   Smokeless tobacco: Never   Tobacco comments:    1 cigarette per week  Vaping Use   Vaping Use: Never used  Substance and Sexual Activity   Alcohol use: No    Alcohol/week: 0.0 standard drinks   Drug use: No   Sexual activity: Not on file  Other Topics Concern   Not on file  Social History Narrative   Not on file   Social Determinants of Health   Financial Resource Strain: Not on file  Food  Insecurity: Not on file  Transportation Needs: Not on file  Physical Activity: Not on file  Stress: Not on file  Social Connections: Not on file  Intimate Partner Violence: Not on file    Review of Systems: See HPI, otherwise negative ROS  Physical Exam: BP 135/74   Pulse 78   Temp (!) 96.7 F (35.9 C) (Temporal)   Resp 20   Ht 4\' 11"  (1.499 m)   Wt 76.7 kg   SpO2 96%   BMI 34.13 kg/m  General:   Alert,  pleasant and cooperative in NAD Head:  Normocephalic and atraumatic. Neck:  Supple; no masses or thyromegaly. Lungs:  Clear throughout to auscultation, normal respiratory effort.    Heart:  +S1, +S2, Regular rate and rhythm, No edema. Abdomen:  Soft, nontender and nondistended. Normal bowel sounds, without guarding, and without rebound.   Neurologic:  Alert and  oriented x4;  grossly normal neurologically.  Impression/Plan: [name] is here for an colonoscopy to be performed for Screening colonoscopy average risk   Risks, benefits, limitations, and alternatives regarding  colonoscopy have been reviewed with the patient.  Questions have been answered.  All parties agreeable.   Jonathon Bellows, MD  11/26/2020, 7:53 AM

## 2020-11-26 NOTE — Anesthesia Preprocedure Evaluation (Signed)
Anesthesia Evaluation  Patient identified by MRN, date of birth, ID band Patient awake    Reviewed: Allergy & Precautions, NPO status , Patient's Chart, lab work & pertinent test results  History of Anesthesia Complications Negative for: history of anesthetic complications  Airway Mallampati: II       Dental   Pulmonary neg sleep apnea, COPD (Pt states it's bronchitis, no inhalers for 6 months),  COPD inhaler, Not current smoker, former smoker,           Cardiovascular hypertension, Pt. on medications (-) Past MI and (-) CHF (-) dysrhythmias (-) Valvular Problems/Murmurs     Neuro/Psych neg Seizures    GI/Hepatic Neg liver ROS, GERD  Medicated and Controlled,  Endo/Other  diabetes, Type 2, Oral Hypoglycemic Agents  Renal/GU negative Renal ROS     Musculoskeletal   Abdominal   Peds  Hematology   Anesthesia Other Findings   Reproductive/Obstetrics                             Anesthesia Physical Anesthesia Plan  ASA: 3  Anesthesia Plan: General   Post-op Pain Management:    Induction: Intravenous  PONV Risk Score and Plan: 3 and Propofol infusion, TIVA and Treatment may vary due to age or medical condition  Airway Management Planned: Nasal Cannula  Additional Equipment:   Intra-op Plan:   Post-operative Plan:   Informed Consent: I have reviewed the patients History and Physical, chart, labs and discussed the procedure including the risks, benefits and alternatives for the proposed anesthesia with the patient or authorized representative who has indicated his/her understanding and acceptance.       Plan Discussed with:   Anesthesia Plan Comments:         Anesthesia Quick Evaluation

## 2020-11-26 NOTE — Op Note (Signed)
[facility] Gastroenterology Patient Name: Barbara Holmes Procedure Date: 11/26/2020 7:04 AM MRN: [id number] Account #: [id number] Date of Birth: [birth year] Admit Type: Outpatient Age: 60 Room: [facility] ENDO ROOM 4 Gender: Female Note Status: Finalized Procedure:             Colonoscopy Indications:           Screening for colorectal malignant neoplasm Providers:             Jonathon Bellows MD, MD Referring MD:          Janine Ores. Rosanna Randy, MD (Referring MD) Medicines:             Monitored Anesthesia Care Complications:         No immediate complications. Procedure:             Pre-Anesthesia Assessment:                        - Prior to the procedure, a History and Physical was                         performed, and patient medications, allergies and                         sensitivities were reviewed. The patient's tolerance                         of previous anesthesia was reviewed.                        - The risks and benefits of the procedure and the                         sedation options and risks were discussed with the                         patient. All questions were answered and informed                         consent was obtained.                        - ASA Grade Assessment: II - A patient with mild                         systemic disease.                        After obtaining informed consent, the colonoscope was                         passed under direct vision. Throughout the procedure,                         the patient's blood pressure, pulse, and oxygen                         saturations were monitored continuously. The                         Colonoscope was introduced through the anus  and                         advanced to the the cecum, identified by the                         appendiceal orifice. The colonoscopy was performed                         with ease. The patient tolerated the procedure well.                         The quality  of the bowel preparation was good. Findings:      The perianal and digital rectal examinations were normal.      Multiple small-mouthed diverticula were found in the sigmoid colon.      The exam was otherwise without abnormality on direct and retroflexion       views. Impression:            - Diverticulosis in the sigmoid colon.                        - The examination was otherwise normal on direct and                         retroflexion views.                        - No specimens collected. Recommendation:        - Discharge patient to home (with escort).                        - Resume previous diet.                        - Continue present medications.                        - Repeat colonoscopy in 10 years for screening                         purposes. Procedure Code(s):     --- Professional ---                        [phone], Colonoscopy, flexible; diagnostic, including                         collection of specimen(s) by brushing or washing, when                         performed (separate procedure) Diagnosis Code(s):     --- Professional ---                        Z12.11, Encounter for screening for malignant neoplasm                         of colon                        K57.30, Diverticulosis of large intestine without  perforation or abscess without bleeding CPT copyright 2019 American Medical Association. All rights reserved. The codes documented in this report are preliminary and upon coder review may  be revised to meet current compliance requirements. Jonathon Bellows, MD Jonathon Bellows MD, MD 11/26/2020 8:16:36 AM This report has been signed electronically. Number of Addenda: 0 Note Initiated On: 11/26/2020 7:04 AM Scope Withdrawal Time: 0 hours 12 minutes 36 seconds  Total Procedure Duration: 0 hours 16 minutes 48 seconds  Estimated Blood Loss:  Estimated blood loss: none.      [facility]

## 2020-11-26 NOTE — Anesthesia Postprocedure Evaluation (Signed)
Anesthesia Post Note  Patient: Barbara Holmes  Procedure(s) Performed: COLONOSCOPY  Patient location during evaluation: Endoscopy Anesthesia Type: General Level of consciousness: awake and alert Pain management: pain level controlled Vital Signs Assessment: post-procedure vital signs reviewed and stable Respiratory status: spontaneous breathing and respiratory function stable Cardiovascular status: stable Anesthetic complications: no   No notable events documented.   Last Vitals:  Vitals:   11/26/20 0827 11/26/20 0837  BP: 101/67 104/68  Pulse:    Resp:    Temp:    SpO2:      Last Pain:  Vitals:   11/26/20 0817  TempSrc: Temporal  PainSc: Asleep                 Arley Salamone K

## 2020-11-26 NOTE — Transfer of Care (Signed)
Immediate Anesthesia Transfer of Care Note  Patient: Barbara Holmes  Procedure(s) Performed: COLONOSCOPY  Patient Location: PACU  Anesthesia Type:General  Level of Consciousness: awake and drowsy  Airway & Oxygen Therapy: Patient Spontanous Breathing  Post-op Assessment: Report given to RN and Post -op Vital signs reviewed and stable  Post vital signs: stable  Last Vitals:  Vitals Value Taken Time  BP 96/61 11/26/20 0820  Temp    Pulse 75 11/26/20 0820  Resp 23 11/26/20 0820  SpO2 94 % 11/26/20 0820  Vitals shown include unvalidated device data.  Last Pain:  Vitals:   11/26/20 0631  TempSrc: Temporal  PainSc: 0-No pain         Complications: No notable events documented.

## 2020-11-27 ENCOUNTER — Other Ambulatory Visit: Payer: Self-pay | Admitting: Family Medicine

## 2020-11-27 DIAGNOSIS — E782 Mixed hyperlipidemia: Secondary | ICD-10-CM

## 2020-11-27 NOTE — Telephone Encounter (Signed)
last RF 11/05/20 #30 1 RF

## 2020-11-28 ENCOUNTER — Other Ambulatory Visit: Payer: Self-pay | Admitting: Family Medicine

## 2020-11-28 DIAGNOSIS — G2581 Restless legs syndrome: Secondary | ICD-10-CM

## 2020-11-29 NOTE — Telephone Encounter (Signed)
Requested medication (s) are due for refill today:yes   Requested medication (s) are on the active medication list: yes   Last refill : 10/17/2020  Future visit scheduled:yes   Notes to clinic: this refill cannot be delegated    Requested Prescriptions  Pending Prescriptions Disp Refills   traMADol (ULTRAM) 50 MG tablet [Pharmacy Med Name: TRAMADOL HCL 50 MG TABLET] 60 tablet     Sig: TAKE 1 TO 2 TABLETS BY MOUTH EVERY DAY AT BEDTIME      Not Delegated - Analgesics:  Opioid Agonists Failed - 11/28/2020  9:07 PM      Failed - This refill cannot be delegated      Failed - Urine Drug Screen completed in last 360 days      Passed - Valid encounter within last 6 months    Recent Outpatient Visits           1 month ago Annual physical exam   [facility] Jerrol Banana., MD   5 months ago Type 2 diabetes mellitus without complication, without long-term current use of insulin [facility])   [facility] Jerrol Banana., MD   9 months ago Type 2 diabetes mellitus without complication, without long-term current use of insulin [facility])   [facility] Jerrol Banana., MD   1 year ago Annual physical exam   [facility] Jerrol Banana., MD   1 year ago Type 2 diabetes mellitus without complication, without long-term current use of insulin [facility])   [facility] Jerrol Banana., MD       Future Appointments             In 5 months Jerrol Banana., MD [facility]

## 2020-12-01 ENCOUNTER — Other Ambulatory Visit: Payer: Self-pay

## 2020-12-01 ENCOUNTER — Ambulatory Visit
Admission: RE | Admit: 2020-12-01 | Discharge: 2020-12-01 | Disposition: A | Discharge status: Home / Self Care | Payer: BC Managed Care – PPO | Source: Ambulatory Visit | Attending: Family Medicine | Admitting: Family Medicine

## 2020-12-01 DIAGNOSIS — Z1231 Encounter for screening mammogram for malignant neoplasm of breast: Secondary | ICD-10-CM | POA: Diagnosis not present

## 2020-12-23 ENCOUNTER — Other Ambulatory Visit: Payer: Self-pay | Admitting: Family Medicine

## 2020-12-23 DIAGNOSIS — E782 Mixed hyperlipidemia: Secondary | ICD-10-CM

## 2021-01-05 ENCOUNTER — Other Ambulatory Visit: Payer: Self-pay | Admitting: Family Medicine

## 2021-01-05 DIAGNOSIS — G2581 Restless legs syndrome: Secondary | ICD-10-CM

## 2021-01-12 ENCOUNTER — Other Ambulatory Visit: Payer: Self-pay | Admitting: Family Medicine

## 2021-01-12 NOTE — Telephone Encounter (Signed)
Requested Prescriptions  Pending Prescriptions Disp Refills  . metFORMIN (GLUCOPHAGE-XR) 500 MG 24 hr tablet [Pharmacy Med Name: METFORMIN HCL ER 500 MG TABLET] 60 tablet 5    Sig: TAKE 2 TABLETS BY MOUTH EVERY DAY WITH BREAKFAST     Endocrinology:  Diabetes - Biguanides Passed - 01/12/2021  9:21 PM      Passed - Cr in normal range and within 360 days    Creatinine, Ser  Date Value Ref Range Status  10/27/2020 0.63 0.57 - 1.00 mg/dL Final         Passed - HBA1C is between 0 and 7.9 and within 180 days    Hgb A1c MFr Bld  Date Value Ref Range Status  10/27/2020 7.1 (H) 4.8 - 5.6 % Final    Comment:             Prediabetes: 5.7 - 6.4          Diabetes: >6.4          Glycemic control for adults with diabetes: <7.0          Passed - eGFR in normal range and within 360 days    GFR calc Af Amer  Date Value Ref Range Status  10/22/2019 114 >59 mL/min/1.73 Final    Comment:    **[facility] currently reports eGFR in compliance with the current**   recommendations of the [employer]. [facility] will   update reporting as new guidelines are published from the NKF-ASN   Task force.    GFR calc non Af Amer  Date Value Ref Range Status  10/22/2019 99 >59 mL/min/1.73 Final   eGFR  Date Value Ref Range Status  10/27/2020 101 >59 mL/min/1.73 Final         Passed - Valid encounter within last 6 months    Recent Outpatient Visits          2 months ago Annual physical exam   [facility] Jerrol Banana., MD   7 months ago Type 2 diabetes mellitus without complication, without long-term current use of insulin [facility])   [facility] Jerrol Ban[facility] MD   10 months ago Type 2 diabetes mellitus without complication, without long-term current use of insulin [facility])   [facility] Jer[facility] Ban[facility] MD   1 year ago Annual physical exam   [facility] Jerrol Ban[facility] MD   1 year ago Type 2 diabetes  mellitus without complication, without long-term current use of insulin [facility])   [facility] Jerrol Banana., MD      Future Appointments            In 3 months Jerrol Banana., MD [facility]           . lisinopril (ZESTRIL) 20 MG tablet [Pharmacy Med Name: LISINOPRIL 20 MG TABLET] 30 tablet 5    Sig: TAKE 1 TABLET BY MOUTH EVERY DAY     Cardiovascular:  ACE Inhibitors Passed - 01/12/2021  9:21 PM      Passed - Cr in normal range and within 180 days    Creatinine, Ser  Date Value Ref Range Status  10/27/2020 0.63 0.57 - 1.00 mg/dL Final         Passed - K in normal range and within 180 days    Potassium  Date Value Ref Range Status  10/27/2020 4.1 3.5 - 5.2 mmol/L Final  Passed - Patient is not pregnant      Passed - Last BP in normal range    BP Readings from Last 1 Encounters:  11/26/20 104/68         Passed - Valid encounter within last 6 months    Recent Outpatient Visits          2 months ago Annual physical exam   [facility] Jerrol Banana., MD   7 months ago Type 2 diabetes mellitus without complication, without long-term current use of insulin [facility])   [facility] Jerrol Banana., MD   10 months ago Type 2 diabetes mellitus without complication, without long-term current use of insulin [facility])   [facility] Jerrol Banana., MD   1 year ago Annual physical exam   [facility] Jerrol Banana., MD   1 year ago Type 2 diabetes mellitus without complication, without long-term current use of insulin [facility])   [facility] Jerrol Banana., MD      Future Appointments            In 3 months Jerrol Banana., MD [facility]

## 2021-01-20 ENCOUNTER — Other Ambulatory Visit: Payer: Self-pay | Admitting: Family Medicine

## 2021-01-20 DIAGNOSIS — E782 Mixed hyperlipidemia: Secondary | ICD-10-CM

## 2021-02-07 ENCOUNTER — Other Ambulatory Visit: Payer: Self-pay | Admitting: Family Medicine

## 2021-02-07 DIAGNOSIS — G2581 Restless legs syndrome: Secondary | ICD-10-CM

## 2021-02-08 NOTE — Telephone Encounter (Signed)
Requested medication (s) are due for refill today: yes  Requested medication (s) are on the active medication list: yes  Last refill:  01/05/21  Future visit scheduled: yes  Notes to clinic:  med not delegated to NT to RF   Requested Prescriptions  Pending Prescriptions Disp Refills   traMADol (ULTRAM) 50 MG tablet [Pharmacy Med Name: TRAMADOL HCL 50 MG TABLET] 60 tablet 0    Sig: TAKE 1 TO 2 TABLETS BY MOUTH EVERY DAY AT BEDTIME     Not Delegated - Analgesics:  Opioid Agonists Failed - 02/07/2021  8:41 AM      Failed - This refill cannot be delegated      Failed - Urine Drug Screen completed in last 360 days      Passed - Valid encounter within last 6 months    Recent Outpatient Visits           3 months ago Annual physical exam   [facility] Jerrol Banana., MD   8 months ago Type 2 diabetes mellitus without complication, without long-term current use of insulin [facility]   [facility] Jerrol Banana., MD   11 months ago Type 2 diabetes mellitus without complication, without long-term current use of insulin [facility])   [facility] Jerrol Banana., MD   1 year ago Annual physical exam   [facility] Jerrol Banana., MD   1 year ago Type 2 diabetes mellitus without complication, without long-term current use of insulin [facility])   [facility] Jerrol Banana., MD       Future Appointments             In 2 months Jerrol Banana., MD [facility]

## 2021-02-09 NOTE — Telephone Encounter (Signed)
Please advise refill?  LOV: 10/27/2020 NOV: 05/02/2021 Last refill: 01/05/2021 #60 w/0 refills

## 2021-04-24 ENCOUNTER — Other Ambulatory Visit: Payer: Self-pay | Admitting: Family Medicine

## 2021-04-24 DIAGNOSIS — E782 Mixed hyperlipidemia: Secondary | ICD-10-CM

## 2021-04-24 NOTE — Telephone Encounter (Signed)
Requested Prescriptions  Pending Prescriptions Disp Refills  . rosuvastatin (CRESTOR) 40 MG tablet [Pharmacy Med Name: ROSUVASTATIN CALCIUM 40 MG TAB] 90 tablet 1    Sig: TAKE 1 TABLET BY MOUTH EVERY DAY     Cardiovascular:  Antilipid - Statins Failed - 04/24/2021 12:44 AM      Failed - LDL in normal range and within 360 days    LDL Chol Calc (NIH)  Date Value Ref Range Status  10/27/2020 104 (H) 0 - 99 mg/dL Final         Failed - HDL in normal range and within 360 days    HDL  Date Value Ref Range Status  10/27/2020 39 (L) >39 mg/dL Final         Failed - Triglycerides in normal range and within 360 days    Triglycerides  Date Value Ref Range Status  10/27/2020 189 (H) 0 - 149 mg/dL Final         Passed - Total Cholesterol in normal range and within 360 days    Cholesterol, Total  Date Value Ref Range Status  10/27/2020 176 100 - 199 mg/dL Final         Passed - Patient is not pregnant      Passed - Valid encounter within last 12 months    Recent Outpatient Visits          5 months ago Annual physical exam   [facility] Jerrol Banana., MD   10 months ago Type 2 diabetes mellitus without complication, without long-term current use of insulin [facility])   [facility] Jerrol Banana., MD   1 year ago Type 2 diabetes mellitus without complication, without long-term current use of insulin [facility])   [facility] Jerrol Banana., MD   1 year ago Annual physical exam   [facility] Jerrol Banana., MD   2 years ago Type 2 diabetes mellitus without complication, without long-term current use of insulin [facility])   [facility] Jerrol Banana., MD      Future Appointments            In 1 week Jerrol Banana., MD [facility]

## 2021-04-26 ENCOUNTER — Other Ambulatory Visit: Payer: Self-pay

## 2021-04-26 ENCOUNTER — Ambulatory Visit: Payer: BC Managed Care – PPO | Admitting: Family Medicine

## 2021-04-26 ENCOUNTER — Encounter: Payer: Self-pay | Admitting: Family Medicine

## 2021-04-26 VITALS — BP 116/76 | HR 60 | Temp 98.0°F | Resp 16 | Ht 59.0 in | Wt 168.7 lb

## 2021-04-26 DIAGNOSIS — E782 Mixed hyperlipidemia: Secondary | ICD-10-CM

## 2021-04-26 DIAGNOSIS — I10 Essential (primary) hypertension: Secondary | ICD-10-CM

## 2021-04-26 DIAGNOSIS — R8290 Unspecified abnormal findings in urine: Secondary | ICD-10-CM | POA: Diagnosis not present

## 2021-04-26 DIAGNOSIS — Z23 Encounter for immunization: Secondary | ICD-10-CM

## 2021-04-26 DIAGNOSIS — E119 Type 2 diabetes mellitus without complications: Secondary | ICD-10-CM

## 2021-04-26 LAB — POCT URINALYSIS DIPSTICK
Bilirubin, UA: NEGATIVE
Glucose, UA: POSITIVE — AB
Ketones, UA: NEGATIVE
Leukocytes, UA: NEGATIVE
Nitrite, UA: NEGATIVE
Odor: NEGATIVE
Protein, UA: POSITIVE — AB
Spec Grav, UA: 1.02 (ref 1.010–1.025)
Urobilinogen, UA: 0.2 E.U./dL
pH, UA: 7 (ref 5.0–8.0)

## 2021-04-26 LAB — POCT GLYCOSYLATED HEMOGLOBIN (HGB A1C)
Est. average glucose Bld gHb Est-mCnc: 146
Hemoglobin A1C: 6.7 % — AB (ref 4.0–5.6)

## 2021-04-26 NOTE — Progress Notes (Signed)
Established patient visit   Patient: Barbara Holmes   DOB: 06-16-1960   61 y.o. Female  MRN: [id number] Visit Date: 04/26/2021  Today's healthcare provider: Wilhemena Durie, MD   Chief Complaint  Patient presents with   Follow-up   Subjective    HPI Patient comes in today for follow-up.  Will need a flu shot and Shingrix.  She is agreeable  His diabetes has been well controlled but she states since she has been on [place] that her urine is slightly foamy.  No other issues at all. HPI   DM and Hypertension Last edited by Doristine Devoid, CMA on 04/26/2021 10:28 AM.      Diabetes Mellitus Type II, follow-up  Lab Results  Component Value Date   HGBA1C 6.7 (A) 04/26/2021   HGBA1C 7.1 (H) 10/27/2020   HGBA1C 6.8 (A) 06/09/2020   Last seen for diabetes 6 months ago.  Management since then includes continuing the same treatment. She reports excellent compliance with treatment. She is not having side effects.   Home blood sugar records: fasting range: 120's-150's  Episodes of hypoglycemia? No    Most Recent Eye Exam: 11/25/20  --------------------------------------------------------------------------------------------------- Hypertension, follow-up  BP Readings from Last 3 Encounters:  04/26/21 116/76  11/26/20 104/68  10/27/20 111/64   Wt Readings from Last 3 Encounters:  04/26/21 168 lb 11.2 oz (76.5 kg)  11/26/20 169 lb (76.7 kg)  10/27/20 175 lb (79.4 kg)     She was last seen for hypertension 6 months ago.  BP at that visit was 111/64. Management since that visit includes; on lisinopril. She reports excellent compliance with treatment. She is not having side effects.  She is exercising. She is adherent to low salt diet.   Outside blood pressures are 120's/70's.   --------------------------------------------------------------------------------------------------- Lipid/Cholesterol, follow-up  Last Lipid Panel: Lab Results  Component  Value Date   CHOL 176 10/27/2020   LDLCALC 104 (H) 10/27/2020   HDL 39 (L) 10/27/2020   TRIG 189 (H) 10/27/2020    She was last seen for this 6 months ago.  Management since that visit includes; labs checked showing-stable. Advised to continue to work on diet and exercise. Changed simvastatin 40 mg to rosuvastatin 40 mg qd.  She reports excellent compliance with treatment. She is not having side effects.   She is following a Regular diet. Current exercise: walking  Last metabolic panel Lab Results  Component Value Date   GLUCOSE 129 (H) 10/27/2020   NA 140 10/27/2020   K 4.1 10/27/2020   BUN 14 10/27/2020   CREATININE 0.63 10/27/2020   EGFR 101 10/27/2020   GFRNONAA 99 10/22/2019   CALCIUM 9.8 10/27/2020   AST 19 10/27/2020   ALT 17 10/27/2020   The 10-year ASCVD risk score (Arnett DK, et al., 2019) is: 9.5%  ---------------------------------------------------------------------------------------------------     Medications: Outpatient Medications Prior to Visit  Medication Sig   albuterol (VENTOLIN HFA) 108 (90 Base) MCG/ACT inhaler Ventolin HFA 90 mcg/actuation aerosol inhaler  INHALE 2 PUFFS BY MOUTH EVERY 4 HOURS AS NEEDED FOR COUGH OR WHEEZING   Cholecalciferol 1000 units capsule Take 1,000 Units by mouth daily.    dapagliflozin propanediol (FARXIGA) 5 MG TABS tablet Take 1 tablet (5 mg total) by mouth daily.   lansoprazole (PREVACID) 30 MG capsule Take 30 mg by mouth daily as needed.    lisinopril (ZESTRIL) 20 MG tablet TAKE 1 TABLET BY MOUTH EVERY DAY   meclizine (ANTIVERT) 25 MG tablet  meclizine 25 mg tablet  TAKE 1 TABLET BY MOUTH TWICE A DAY AS NEEDED   meloxicam (MOBIC) 15 MG tablet    metFORMIN (GLUCOPHAGE-XR) 500 MG 24 hr tablet TAKE 2 TABLETS BY MOUTH EVERY DAY WITH BREAKFAST   naproxen (NAPROSYN) 500 MG tablet Take 500 mg by mouth 2 (two) times daily as needed.   rosuvastatin (CRESTOR) 40 MG tablet TAKE 1 TABLET BY MOUTH EVERY DAY   traMADol (ULTRAM)  50 MG tablet TAKE 1 TO 2 TABLETS BY MOUTH EVERY DAY AT BEDTIME   [DISCONTINUED] canagliflozin (INVOKANA) 300 MG TABS tablet Take 1 tablet (300 mg total) by mouth daily before breakfast. (Patient not taking: No sig reported)   [DISCONTINUED] polyethylene glycol-electrolytes (GAVILYTE-N WITH FLAVOR PACK) 420 g solution Drink one 8 oz glass every 20 mins until entire container is finished starting at 5:00pm on 11/25/20   [DISCONTINUED] simvastatin (ZOCOR) 40 MG tablet Take 1 tablet (40 mg total) by mouth daily.   No facility-administered medications prior to visit.    Review of Systems  Constitutional:  Negative for appetite change, chills, fatigue and fever.  Respiratory:  Negative for chest tightness and shortness of breath.   Cardiovascular:  Negative for chest pain and palpitations.  Gastrointestinal:  Negative for abdominal pain, nausea and vomiting.  Neurological:  Negative for dizziness and weakness.       Objective    BP 116/76 (BP Location: Right Arm, Patient Position: Sitting, Cuff Size: Normal)   Pulse 60   Temp 98 F (36.7 C) (Oral)   Resp 16   Ht 4' 11" (1.499 m)   Wt 168 lb 11.2 oz (76.5 kg)   BMI 34.07 kg/m     Physical Exam Vitals reviewed.  Constitutional:      Appearance: She is well-developed.  HENT:     Head: Normocephalic and atraumatic.     Right Ear: External ear normal.     Left Ear: External ear normal.     Nose: Nose normal.  Eyes:     General: No scleral icterus.    Conjunctiva/sclera: Conjunctivae normal.     Pupils: Pupils are equal, round, and reactive to light.  Cardiovascular:     Rate and Rhythm: Normal rate and regular rhythm.     Heart sounds: Normal heart sounds.  Pulmonary:     Effort: Pulmonary effort is normal.     Breath sounds: Normal breath sounds.  Abdominal:     General: Bowel sounds are normal.     Palpations: Abdomen is soft.  Skin:    General: Skin is warm and dry.  Neurological:     General: No focal deficit present.      Mental Status: She is alert and oriented to person, place, and time.  Psychiatric:        Mood and Affect: Mood normal.        Behavior: Behavior normal.        Thought Content: Thought content normal.        Judgment: Judgment normal.      Results for orders placed or performed in visit on 04/26/21  POCT glycosylated hemoglobin (Hb A1C)  Result Value Ref Range   Hemoglobin A1C 6.7 (A) 4.0 - 5.6 %   Est. average glucose Bld gHb Est-mCnc 146   POCT urinalysis dipstick  Result Value Ref Range   Color, UA Yellow    Clarity, UA Clear    Glucose, UA Positive (A) Negative   Bilirubin, UA Negative  Ketones, UA Negative    Spec Grav, UA 1.020 1.010 - 1.025   Blood, UA Trace    pH, UA 7.0 5.0 - 8.0   Protein, UA Positive (A) Negative   Urobilinogen, UA 0.2 0.2 or 1.0 E.U./dL   Nitrite, UA Negative    Leukocytes, UA Negative Negative   Appearance Clear    Odor Negative     Assessment & Plan     1. Type 2 diabetes mellitus without complication, without long-term current use of insulin (HCC) Doing well on Farxiga 5 and metformin 1000 mg daily - POCT glycosylated hemoglobin (Hb A1C)  2. Mixed hyperlipidemia Rosuvastatin 40 - Lipid panel  3. Essential (primary) hypertension Lisinopril 20  4. Need for influenza vaccination  - Flu Vaccine QUAD 85moIM (Fluarix, Fluzone & Alfiuria Quad PF)  5. Need for shingles vaccine  - Varicella-zoster vaccine IM  6. Cloudy urine  - POCT urinalysis dipstick   No follow-ups on file.      I, RWilhemena Durie MD, have reviewed all documentation for this visit. The documentation on 04/28/21 for the exam, diagnosis, procedures, and orders are all accurate and complete.    Maisa Bedingfield GCranford Mon MD  [id number] [phone]([phone](fax)  [place]

## 2021-05-02 ENCOUNTER — Ambulatory Visit: Payer: Self-pay | Admitting: Family Medicine

## 2021-05-03 DIAGNOSIS — E782 Mixed hyperlipidemia: Secondary | ICD-10-CM | POA: Diagnosis not present

## 2021-05-04 LAB — LIPID PANEL
Chol/HDL Ratio: 2.9 ratio (ref 0.0–4.4)
Cholesterol, Total: 111 mg/dL (ref 100–199)
HDL: 38 mg/dL — ABNORMAL LOW (ref >39)
LDL Chol Calc (NIH): 45 mg/dL (ref 0–99)
Triglycerides: 168 mg/dL — ABNORMAL HIGH (ref 0–149)
VLDL Cholesterol Cal: 28 mg/dL (ref 5–40)

## 2021-05-08 ENCOUNTER — Other Ambulatory Visit: Payer: Self-pay | Admitting: Family Medicine

## 2021-05-08 DIAGNOSIS — G2581 Restless legs syndrome: Secondary | ICD-10-CM

## 2021-05-09 NOTE — Telephone Encounter (Signed)
LOV:  04/26/2021  NOV 10/26/2021

## 2021-05-09 NOTE — Telephone Encounter (Signed)
Requested medication (s) are due for refill today:   Provider to review  Requested medication (s) are on the active medication list:   Yes  Future visit scheduled:   Yes   Last ordered: 02/09/2021 #60, 1 refill  Non delegated refill   Requested Prescriptions  Pending Prescriptions Disp Refills   traMADol (ULTRAM) 50 MG tablet [Pharmacy Med Name: TRAMADOL HCL 50 MG TABLET] 60 tablet 1    Sig: TAKE 1 TO 2 TABLETS BY MOUTH EVERY DAY AT BEDTIME     Not Delegated - Analgesics:  Opioid Agonists Failed - 05/08/2021  8:42 PM      Failed - This refill cannot be delegated      Failed - Urine Drug Screen completed in last 360 days      Passed - Valid encounter within last 6 months    Recent Outpatient Visits           1 week ago Type 2 diabetes mellitus without complication, without long-term current use of insulin [facility])   [facility] Jerrol Banana., MD   6 months ago Annual physical exam   [facility] Jerrol Banana., MD   11 months ago Type 2 diabetes mellitus without complication, without long-term current use of insulin [facility])   [facility] Jerrol Banana., MD   1 year ago Type 2 diabetes mellitus without complication, without long-term current use of insulin [facility])   [facility] Jerrol Banana., MD   1 year ago Annual physical exam   [facility] Jerrol Banana., MD       Future Appointments             In 5 months Jerrol Banana., MD [facility]

## 2021-05-18 ENCOUNTER — Telehealth: Payer: Self-pay | Admitting: Family Medicine

## 2021-05-18 NOTE — Telephone Encounter (Signed)
Shes not on simvastatin, she is on rosuvastatin, are you sure it was a refill request?

## 2021-05-18 NOTE — Telephone Encounter (Signed)
[facility] faxed refill request for the following medications:  simvastatin (ZOCOR) 40 MG tablet   Please advise.

## 2021-07-18 ENCOUNTER — Other Ambulatory Visit: Payer: Self-pay | Admitting: Family Medicine

## 2021-09-04 ENCOUNTER — Other Ambulatory Visit: Payer: Self-pay | Admitting: Family Medicine

## 2021-09-04 DIAGNOSIS — G2581 Restless legs syndrome: Secondary | ICD-10-CM

## 2021-09-06 NOTE — Telephone Encounter (Signed)
Requested medication (s) are due for refill today: yes ? ?Requested medication (s) are on the active medication list: yes ? ?Last refill:  05/10/21 #60/2 ? ?Future visit scheduled: yes ? ?Notes to clinic:  Unable to refill per protocol, cannot delegate. ? ? ?  ?Requested Prescriptions  ?Pending Prescriptions Disp Refills  ? traMADol (ULTRAM) 50 MG tablet [Pharmacy Med Name: TRAMADOL HCL 50 MG TABLET] 60 tablet 2  ?  Sig: TAKE 1 TO 2 TABLETS BY MOUTH EVERY DAY AT BEDTIME  ?  ? Not Delegated - Analgesics:  Opioid Agonists Failed - 09/04/2021  8:16 PM  ?  ?  Failed - This refill cannot be delegated  ?  ?  Failed - Urine Drug Screen completed in last 360 days  ?  ?  Failed - Valid encounter within last 3 months  ?  Recent Outpatient Visits   ? ?      ? 4 months ago Type 2 diabetes mellitus without complication, without long-term current use of insulin ([place])  ? [facility] Jerrol Banana., MD  ? 10 months ago Annual physical exam  ? [facility] Jerrol Banana., MD  ? 1 year ago Type 2 diabetes mellitus without complication, without long-term current use of insulin ([place])  ? [facility] Jerrol Banana., MD  ? 1 year ago Type 2 diabetes mellitus without complication, without long-term current use of insulin ([place])  ? [facility] Jerrol Banana., MD  ? 1 year ago Annual physical exam  ? [facility] Jerrol Banana., MD  ? ?  ?  ?Future Appointments   ? ?        ? In 1 month Jerrol Banana., MD [facility]  ? ?  ? ?  ?  ?  ? ?

## 2021-10-09 ENCOUNTER — Other Ambulatory Visit: Payer: Self-pay | Admitting: Family Medicine

## 2021-10-09 DIAGNOSIS — E119 Type 2 diabetes mellitus without complications: Secondary | ICD-10-CM

## 2021-10-15 ENCOUNTER — Other Ambulatory Visit: Payer: Self-pay | Admitting: Family Medicine

## 2021-10-15 DIAGNOSIS — E782 Mixed hyperlipidemia: Secondary | ICD-10-CM

## 2021-10-26 ENCOUNTER — Ambulatory Visit (INDEPENDENT_AMBULATORY_CARE_PROVIDER_SITE_OTHER): Payer: BC Managed Care – PPO | Admitting: Family Medicine

## 2021-10-26 ENCOUNTER — Ambulatory Visit
Admission: RE | Admit: 2021-10-26 | Discharge: 2021-10-26 | Disposition: A | Discharge status: Home / Self Care | Payer: BC Managed Care – PPO | Source: Ambulatory Visit | Attending: Family Medicine | Admitting: Family Medicine

## 2021-10-26 ENCOUNTER — Ambulatory Visit
Admission: RE | Admit: 2021-10-26 | Discharge: 2021-10-26 | Disposition: A | Discharge status: Home / Self Care | Payer: BC Managed Care – PPO | Attending: Family Medicine | Admitting: Family Medicine

## 2021-10-26 ENCOUNTER — Encounter: Payer: Self-pay | Admitting: Family Medicine

## 2021-10-26 VITALS — BP 116/80 | HR 67 | Temp 98.0°F | Resp 16 | Ht 59.0 in | Wt 167.0 lb

## 2021-10-26 DIAGNOSIS — M7660 Achilles tendinitis, unspecified leg: Secondary | ICD-10-CM

## 2021-10-26 DIAGNOSIS — Z23 Encounter for immunization: Secondary | ICD-10-CM

## 2021-10-26 DIAGNOSIS — I10 Essential (primary) hypertension: Secondary | ICD-10-CM | POA: Diagnosis not present

## 2021-10-26 DIAGNOSIS — E782 Mixed hyperlipidemia: Secondary | ICD-10-CM

## 2021-10-26 DIAGNOSIS — Z0000 Encounter for general adult medical examination without abnormal findings: Secondary | ICD-10-CM

## 2021-10-26 DIAGNOSIS — E119 Type 2 diabetes mellitus without complications: Secondary | ICD-10-CM | POA: Diagnosis not present

## 2021-10-26 DIAGNOSIS — R200 Anesthesia of skin: Secondary | ICD-10-CM | POA: Diagnosis not present

## 2021-10-26 DIAGNOSIS — Z6836 Body mass index (BMI) 36.0-36.9, adult: Secondary | ICD-10-CM

## 2021-10-26 DIAGNOSIS — G959 Disease of spinal cord, unspecified: Secondary | ICD-10-CM | POA: Diagnosis not present

## 2021-10-26 DIAGNOSIS — M50323 Other cervical disc degeneration at C6-C7 level: Secondary | ICD-10-CM | POA: Diagnosis not present

## 2021-10-26 DIAGNOSIS — R202 Paresthesia of skin: Secondary | ICD-10-CM | POA: Diagnosis not present

## 2021-10-26 MED ORDER — CELECOXIB 200 MG PO CAPS: 200.0000 mg | ORAL_CAPSULE | Freq: Every evening | ORAL | 0 refills | Status: DC | PRN

## 2021-10-26 NOTE — Progress Notes (Signed)
Complete physical exam  I,April Miller,acting as a scribe for Wilhemena Durie, MD.,have documented all relevant documentation on the behalf of Wilhemena Durie, MD,as directed by  Wilhemena Durie, MD while in the presence of Wilhemena Durie, MD.   Patient: Barbara Holmes   DOB: 10-14-1960   61 y.o. Female  MRN: [id number] Visit Date: 10/26/2021  Today's healthcare provider: Wilhemena Durie, MD   Chief Complaint  Patient presents with   Annual Exam   Subjective    Barbara Holmes is a 60 y.o. female who presents today for a complete physical exam.  She reports consuming a general diet. The patient has a physically strenuous job, but has no regular exercise apart from work.  She generally feels fairly well. She reports sleeping well. She does not have additional problems to discuss today.  HPI  She needs her second Shingrix shot.  She does complain of left heel discomfort and swelling and with certain movements of her neck her left arm tingles at times.  She has no weakness in the arm and there is no known trauma  Past Medical History:  Diagnosis Date   Diabetes mellitus without complication ([place])    Hyperlipidemia    Hypertension    Restless leg syndrome    Past Surgical History:  Procedure Laterality Date   ABDOMINAL HYSTERECTOMY     total   BREAST EXCISIONAL BIOPSY Right 1999   benign   COLONOSCOPY N/A 11/26/2020   Procedure: COLONOSCOPY;  Surgeon: Jonathon Bellows, MD;  Location: [facility] ENDOSCOPY;  Service: Gastroenterology;  Laterality: N/A;   FOOT SURGERY     LAPAROSCOPY     endometriosis   Social History   Socioeconomic History   Marital status: Married    Spouse name: Not on file   Number of children: Not on file   Years of education: Not on file   Highest education level: Not on file  Occupational History   Not on file  Tobacco Use   Smoking status: Former    Years: 3.00    Types: Cigarettes    Quit date: 06/05/1988    Years since quitting:  33.4   Smokeless tobacco: Never   Tobacco comments:    1 cigarette per week  Vaping Use   Vaping Use: Never used  Substance and Sexual Activity   Alcohol use: No    Alcohol/week: 0.0 standard drinks   Drug use: No   Sexual activity: Not on file  Other Topics Concern   Not on file  Social History Narrative   Not on file   Social Determinants of Health   Financial Resource Strain: Not on file  Food Insecurity: Not on file  Transportation Needs: Not on file  Physical Activity: Not on file  Stress: Not on file  Social Connections: Not on file  Intimate Partner Violence: Not on file   Family Status  Relation Name Status   Mother  Alive   Father  Deceased   Sister  Alive   Brother  Deceased   MGM  Deceased   Mat Aunt  (Not Specified)   Family History  Problem Relation Age of Onset   Hypertension Mother    Hyperlipidemia Mother    Diabetes Mother    Dementia Mother    Hypertension Father    Hyperlipidemia Father    Hypertension Sister    Hyperlipidemia Sister    Diabetes Sister    Diabetes Maternal Grandmother  Breast cancer Maternal Aunt 58   No Known Allergies  Patient Care Team: Jerrol Banana., MD as PCP - General (Family Medicine)   Medications: Outpatient Medications Prior to Visit  Medication Sig   albuterol (VENTOLIN HFA) 108 (90 Base) MCG/ACT inhaler Ventolin HFA 90 mcg/actuation aerosol inhaler  INHALE 2 PUFFS BY MOUTH EVERY 4 HOURS AS NEEDED FOR COUGH OR WHEEZING   Cholecalciferol 1000 units capsule Take 1,000 Units by mouth daily.    FARXIGA 5 MG TABS tablet TAKE 1 TABLET (5 MG TOTAL) BY MOUTH DAILY.   lansoprazole (PREVACID) 30 MG capsule Take 30 mg by mouth daily as needed.    lisinopril (ZESTRIL) 20 MG tablet TAKE 1 TABLET BY MOUTH EVERY DAY   meclizine (ANTIVERT) 25 MG tablet meclizine 25 mg tablet  TAKE 1 TABLET BY MOUTH TWICE A DAY AS NEEDED   meloxicam (MOBIC) 15 MG tablet    metFORMIN (GLUCOPHAGE-XR) 500 MG 24 hr tablet TAKE 2  TABLETS BY MOUTH EVERY DAY WITH BREAKFAST   rosuvastatin (CRESTOR) 40 MG tablet TAKE 1 TABLET BY MOUTH EVERY DAY   traMADol (ULTRAM) 50 MG tablet TAKE 1 TO 2 TABLETS BY MOUTH EVERY DAY AT BEDTIME   [DISCONTINUED] naproxen (NAPROSYN) 500 MG tablet Take 500 mg by mouth 2 (two) times daily as needed. (Patient not taking: Reported on 10/26/2021)   No facility-administered medications prior to visit.    Review of Systems  HENT:  Positive for sinus pressure and sneezing.   Eyes:  Positive for itching.  Musculoskeletal:  Positive for arthralgias.  Neurological:  Positive for numbness.  All other systems reviewed and are negative.  Last hemoglobin A1c Lab Results  Component Value Date   HGBA1C 6.7 (A) 04/26/2021      Objective     BP 116/80 (BP Location: Right Arm, Patient Position: Sitting, Cuff Size: Normal)   Pulse 67   Temp 98 F (36.7 C) (Temporal)   Resp 16   Ht '4\' 11"'$  (1.499 m)   Wt 167 lb (75.8 kg)   SpO2 97%   BMI 33.73 kg/m  BP Readings from Last 3 Encounters:  10/26/21 116/80  04/26/21 116/76  11/26/20 104/68   Wt Readings from Last 3 Encounters:  10/26/21 167 lb (75.8 kg)  04/26/21 168 lb 11.2 oz (76.5 kg)  11/26/20 169 lb (76.7 kg)       Physical Exam Vitals reviewed. Exam conducted with a chaperone present.  HENT:     Head: Normocephalic and atraumatic.     Right Ear: Tympanic membrane normal.     Left Ear: Tympanic membrane normal.  Eyes:     Pupils: Pupils are equal, round, and reactive to light.  Cardiovascular:     Rate and Rhythm: Normal rate and regular rhythm.     Pulses: Normal pulses.     Heart sounds: Normal heart sounds.  Pulmonary:     Effort: Pulmonary effort is normal.     Breath sounds: Normal breath sounds.  Chest:  Breasts:    Breasts are symmetrical.  Musculoskeletal:     Cervical back: Normal range of motion and neck supple.     Comments: She does have minimal swelling and tenderness of the insertion of the Achilles into the  left calcaneus  Skin:    General: Skin is warm and dry.  Neurological:     Mental Status: She is alert and oriented to person, place, and time. Mental status is at baseline.     Comments: Diabetic  monofilament exam of the feet is normal Strength is normal in both upper extremities with negative Spurling sign Negative Tinel's sign bilateral  Psychiatric:        Mood and Affect: Mood normal.        Behavior: Behavior normal.        Thought Content: Thought content normal.        Judgment: Judgment normal.      Last depression screening scores    10/26/2021   10:22 AM 10/27/2020    8:37 AM 02/25/2020    4:12 PM  PHQ 2/9 Scores  PHQ - 2 Score 0 0 0  PHQ- 9 Score 0 0 0   Last fall risk screening    10/26/2021   10:22 AM  [place] in the past year? 0  Number falls in past yr: 0  Injury with Fall? 0  Risk for fall due to : No Fall Risks  Follow up Falls evaluation completed   Last Audit-C alcohol use screening    10/26/2021   10:22 AM  Alcohol Use Disorder Test (AUDIT)  1. How often do you have a drink containing alcohol? 0  2. How many drinks containing alcohol do you have on a typical day when you are drinking? 0  3. How often do you have six or more drinks on one occasion? 0  AUDIT-C Score 0   A score of 3 or more in women, and 4 or more in men indicates increased risk for alcohol abuse, EXCEPT if all of the points are from question 1   No results found for any visits on 10/26/21.  Assessment & Plan    Routine Health Maintenance and Physical Exam  Exercise Activities and Dietary recommendations  Goals   None     Immunization History  Administered Date(s) Administered   Influenza Whole 03/14/2017   Influenza, Quadrivalent, Recombinant, Inj, Pf 03/08/2019   Influenza,inj,Quad PF,6+ Mos 04/03/2018, 04/26/2021   Influenza-Unspecified 03/11/2015, 01/31/2020   Moderna Covid-19 Vaccine Bivalent Booster 23yr & up 08/02/2020   Moderna Sars-Covid-2  Vaccination 12/30/2019, 01/27/2020   Pneumococcal Polysaccharide-23 04/03/2016   Td 04/13/2003   Tdap 08/01/2012   Zoster Recombinat (Shingrix) 04/26/2021, 10/26/2021    Health Maintenance  Topic Date Due   HIV Screening  Never done   FOOT EXAM  02/12/2018   COVID-19 Vaccine (3 - Moderna risk series) 08/02/2020   PAP SMEAR-Modifier  09/26/2020   HEMOGLOBIN A1C  10/24/2021   OPHTHALMOLOGY EXAM  11/25/2021   INFLUENZA VACCINE  01/03/2022   TETANUS/TDAP  08/01/2022   MAMMOGRAM  12/02/2022   COLONOSCOPY (Pts 45-430yrInsurance coverage will need to be confirmed)  11/27/2030   Zoster Vaccines- Shingrix  Completed   Hepatitis C Screening  Addressed   HPV VACCINES  Aged Out    Discussed health benefits of physical activity, and encouraged her to engage in regular exercise appropriate for her age and condition.  1. Annual physical exam Pelvic exam next year. - Lipid panel - TSH - CBC w/Diff/Platelet - Comprehensive Metabolic Panel (CMET) - Hemoglobin A1c  2. Type 2 diabetes mellitus without complication, without long-term current use of insulin (HCC)  - Lipid panel - TSH - CBC w/Diff/Platelet - Comprehensive Metabolic Panel (CMET) - Hemoglobin A1c  3. Mixed hyperlipidemia  - Lipid panel - TSH - CBC w/Diff/Platelet - Comprehensive Metabolic Panel (CMET) - Hemoglobin A1c  4. Essential (primary) hypertension  - Lipid panel - TSH - CBC w/Diff/Platelet - Comprehensive Metabolic Panel (  CMET) - Hemoglobin A1c  5. Class 2 severe obesity due to excess calories with serious comorbidity and body mass index (BMI) of 36.0 to 36.9 in adult (HCC)  - Lipid panel - TSH - CBC w/Diff/Platelet - Comprehensive Metabolic Panel (CMET) - Hemoglobin A1c  6. Cervical myelopathy [facility]) May need further work-up if symptoms persist - DG Cervical Spine Complete  7. Need for shingles vaccine  - Varicella-zoster vaccine IM (Shingrix)  8. Achilles tendinopathy Refer to podiatry if  symptoms persist, she is instructed in good footwear which she wears anyway - celecoxib (CELEBREX) 200 MG capsule; Take 1 capsule (200 mg total) by mouth at bedtime as needed.  Dispense: 30 capsule; Refill: 0   Return in about 6 months (around 04/28/2022).     I, Wilhemena Durie, MD, have reviewed all documentation for this visit. The documentation on 10/26/21 for the exam, diagnosis, procedures, and orders are all accurate and complete.    Joseph Johns Cranford Mon, MD  [facility] [phone] (phone) [phone] (fax)  [place]

## 2021-10-27 ENCOUNTER — Encounter: Payer: Self-pay | Admitting: Family Medicine

## 2021-10-27 LAB — CBC WITH DIFFERENTIAL/PLATELET
Basophils Absolute: 0.1 10*3/uL (ref 0.0–0.2)
Basos: 1 %
EOS (ABSOLUTE): 0.2 10*3/uL (ref 0.0–0.4)
Eos: 2 %
Hematocrit: 40.1 % (ref 34.0–46.6)
Hemoglobin: 13.5 g/dL (ref 11.1–15.9)
Immature Grans (Abs): 0 10*3/uL (ref 0.0–0.1)
Immature Granulocytes: 0 %
Lymphocytes Absolute: 3.2 10*3/uL — ABNORMAL HIGH (ref 0.7–3.1)
Lymphs: 34 %
MCH: 29.5 pg (ref 26.6–33.0)
MCHC: 33.7 g/dL (ref 31.5–35.7)
MCV: 88 fL (ref 79–97)
Monocytes Absolute: 0.6 10*3/uL (ref 0.1–0.9)
Monocytes: 7 %
Neutrophils Absolute: 5.4 10*3/uL (ref 1.4–7.0)
Neutrophils: 56 %
Platelets: 246 10*3/uL (ref 150–450)
RBC: 4.58 x10E6/uL (ref 3.77–5.28)
RDW: 12.5 % (ref 11.7–15.4)
WBC: 9.5 10*3/uL (ref 3.4–10.8)

## 2021-10-27 LAB — LIPID PANEL
Chol/HDL Ratio: 2.9 ratio (ref 0.0–4.4)
Cholesterol, Total: 115 mg/dL (ref 100–199)
HDL: 40 mg/dL (ref >39)
LDL Chol Calc (NIH): 46 mg/dL (ref 0–99)
Triglycerides: 176 mg/dL — ABNORMAL HIGH (ref 0–149)
VLDL Cholesterol Cal: 29 mg/dL (ref 5–40)

## 2021-10-27 LAB — TSH: TSH: 0.938 u[IU]/mL (ref 0.450–4.500)

## 2021-10-27 LAB — COMPREHENSIVE METABOLIC PANEL
ALT: 20 IU/L (ref 0–32)
AST: 25 IU/L (ref 0–40)
Albumin/Globulin Ratio: 1.7 (ref 1.2–2.2)
Albumin: 4.5 g/dL (ref 3.8–4.8)
Alkaline Phosphatase: 59 IU/L (ref 44–121)
BUN/Creatinine Ratio: 17 (ref 12–28)
BUN: 16 mg/dL (ref 8–27)
Bilirubin Total: 0.4 mg/dL (ref 0.0–1.2)
CO2: 26 mmol/L (ref 20–29)
Calcium: 9.7 mg/dL (ref 8.7–10.3)
Chloride: 99 mmol/L (ref 96–106)
Creatinine, Ser: 0.95 mg/dL (ref 0.57–1.00)
Globulin, Total: 2.6 g/dL (ref 1.5–4.5)
Glucose: 109 mg/dL — ABNORMAL HIGH (ref 70–99)
Potassium: 4.3 mmol/L (ref 3.5–5.2)
Sodium: 141 mmol/L (ref 134–144)
Total Protein: 7.1 g/dL (ref 6.0–8.5)
eGFR: 68 mL/min/{1.73_m2} (ref >59)

## 2021-10-27 LAB — HEMOGLOBIN A1C
Est. average glucose Bld gHb Est-mCnc: 157 mg/dL
Hgb A1c MFr Bld: 7.1 % — ABNORMAL HIGH (ref 4.8–5.6)

## 2021-11-21 ENCOUNTER — Other Ambulatory Visit: Payer: Self-pay | Admitting: Family Medicine

## 2021-11-21 DIAGNOSIS — M7660 Achilles tendinitis, unspecified leg: Secondary | ICD-10-CM

## 2022-01-11 ENCOUNTER — Other Ambulatory Visit: Payer: Self-pay | Admitting: Family Medicine

## 2022-01-15 ENCOUNTER — Other Ambulatory Visit: Payer: Self-pay | Admitting: Family Medicine

## 2022-01-15 DIAGNOSIS — G2581 Restless legs syndrome: Secondary | ICD-10-CM

## 2022-01-27 ENCOUNTER — Other Ambulatory Visit: Payer: Self-pay | Admitting: Family Medicine

## 2022-01-27 DIAGNOSIS — Z1231 Encounter for screening mammogram for malignant neoplasm of breast: Secondary | ICD-10-CM

## 2022-02-21 ENCOUNTER — Ambulatory Visit
Admission: RE | Admit: 2022-02-21 | Discharge: 2022-02-21 | Disposition: A | Discharge status: Home / Self Care | Payer: BC Managed Care – PPO | Source: Ambulatory Visit | Attending: Family Medicine | Admitting: Family Medicine

## 2022-02-21 DIAGNOSIS — Z1231 Encounter for screening mammogram for malignant neoplasm of breast: Secondary | ICD-10-CM | POA: Diagnosis not present

## 2022-05-02 NOTE — Progress Notes (Signed)
I,[name],acting as a scribe for [employer], MD.,have documented all relevant documentation on the behalf of Barbara Foster, MD,as directed by  Barbara Foster, MD while in the presence of Barbara Foster, MD.   Established patient visit   Patient: Barbara Holmes   DOB: 27-Jun-1960   61 y.o. Female  MRN: [id number] Visit Date: 05/03/2022  Today's healthcare provider: [name], MD   Chief Complaint  Patient presents with   Follow-Up chronic Disease   Subjective    HPI  Diabetes Mellitus Type II, follow-up  Lab Results  Component Value Date   HGBA1C 7.9 (A) 05/03/2022   HGBA1C 7.1 (H) 10/26/2021   HGBA1C 6.7 (A) 04/26/2021   Last seen for diabetes 6 months ago.  Management since then includes continue with Farxiga 5 mg and Metformin 500 mg 2 tablet every day  She reports excellent compliance with treatment.  Home blood sugar records:  occasionally 120's -130  Episodes of hypoglycemia? No   Most Recent Eye Exam: 08/2021  --------------------------------------------------------------------------------------------------- Hypertension, follow-up  BP Readings from Last 3 Encounters:  05/03/22 130/73  10/26/21 116/80  04/26/21 116/76   Wt Readings from Last 3 Encounters:  05/03/22 169 lb 11.2 oz (77 kg)  10/26/21 167 lb (75.8 kg)  04/26/21 168 lb 11.2 oz (76.5 kg)     She was last seen for hypertension 6 months ago.  BP at that visit was 116/80. Management since that visit includes continue Lisinopril 20 mg. She reports excellent compliance with treatment.  Outside blood pressures are not being checked.  She does not smoke.  Lipid/Cholesterol, follow-up  Last Lipid Panel: Lab Results  Component Value Date   CHOL 115 10/26/2021   LDLCALC 46 10/26/2021   HDL 40 10/26/2021   TRIG 176 (H) 10/26/2021    She was last seen for this 6 months ago.  Management since that visit includes  continue Atorvastatin 40 mg..  Symptoms: No appetite changes No foot ulcerations  No chest pain No chest pressure/discomfort  No dyspnea No orthopnea  No fatigue No lower extremity edema  No palpitations No paroxysmal nocturnal dyspnea  No nausea No numbness or tingling of extremity  No polydipsia No polyuria  No speech difficulty No syncope   She is following a Regular diet. Current exercise: none  Last metabolic panel Lab Results  Component Value Date   GLUCOSE 109 (H) 10/26/2021   NA 141 10/26/2021   K 4.3 10/26/2021   BUN 16 10/26/2021   CREATININE 0.95 10/26/2021   EGFR 68 10/26/2021   GFRNONAA 99 10/22/2019   CALCIUM 9.7 10/26/2021   AST 25 10/26/2021   ALT 20 10/26/2021   The ASCVD Risk score (Arnett DK, et al., 2019) failed to calculate for the following reasons:   The valid total cholesterol range is 130 to 320 mg/dL  ---------------------------------------------------------------------------------------------------   Medications: Outpatient Medications Prior to Visit  Medication Sig   albuterol (VENTOLIN HFA) 108 (90 Base) MCG/ACT inhaler Ventolin HFA 90 mcg/actuation aerosol inhaler  INHALE 2 PUFFS BY MOUTH EVERY 4 HOURS AS NEEDED FOR COUGH OR WHEEZING   celecoxib (CELEBREX) 200 MG capsule TAKE 1 CAPSULE (200 MG TOTAL) BY MOUTH AT BEDTIME AS NEEDED.   Cholecalciferol 1000 units capsule Take 1,000 Units by mouth daily.    FARXIGA 5 MG TABS tablet TAKE 1 TABLET (5 MG TOTAL) BY MOUTH DAILY.   lisinopril (ZESTRIL) 20 MG tablet TAKE 1 TABLET BY MOUTH EVERY DAY   meclizine (ANTIVERT) 25 MG  tablet meclizine 25 mg tablet  TAKE 1 TABLET BY MOUTH TWICE A DAY AS NEEDED   meloxicam (MOBIC) 15 MG tablet    metFORMIN (GLUCOPHAGE-XR) 500 MG 24 hr tablet TAKE 2 TABLETS BY MOUTH EVERY DAY WITH BREAKFAST   traMADol (ULTRAM) 50 MG tablet TAKE 1 TO 2 TABLETS BY MOUTH EVERY DAY AT BEDTIME   [DISCONTINUED] lansoprazole (PREVACID) 30 MG capsule Take 30 mg by mouth daily as needed.     [DISCONTINUED] rosuvastatin (CRESTOR) 40 MG tablet TAKE 1 TABLET BY MOUTH EVERY DAY   No facility-administered medications prior to visit.    Review of Systems     Objective    BP 130/73 (BP Location: Right Arm, Patient Position: Sitting, Cuff Size: Large)   Pulse 74   Temp 98.2 F (36.8 C) (Oral)   Resp 16   Ht _0  (1.499 m)   Wt 169 lb 11.2 oz (77 kg)   SpO2 99%   BMI 34.28 kg/m    Physical Exam Vitals reviewed.  Constitutional:      General: She is not in acute distress.    Appearance: Normal appearance. She is not ill-appearing, toxic-appearing or diaphoretic.  Eyes:     Conjunctiva/sclera: Conjunctivae normal.  Cardiovascular:     Rate and Rhythm: Normal rate and regular rhythm.     Pulses: Normal pulses.     Heart sounds: Normal heart sounds. No murmur heard.    No friction rub. No gallop.  Pulmonary:     Effort: Pulmonary effort is normal. No respiratory distress.     Breath sounds: Normal breath sounds. No stridor. No wheezing, rhonchi or rales.  Abdominal:     General: Bowel sounds are normal. There is no distension.     Palpations: Abdomen is soft.     Tenderness: There is no abdominal tenderness.  Musculoskeletal:     Right lower leg: No edema.     Left lower leg: No edema.  Skin:    Findings: No erythema or rash.  Neurological:     Mental Status: She is alert and oriented to person, place, and time.       Results for orders placed or performed in visit on 05/03/22  POCT glycosylated hemoglobin (Hb A1C)  Result Value Ref Range   Hemoglobin A1C 7.9 (A) 4.0 - 5.6 %   Est. average glucose Bld gHb Est-mCnc 180     Assessment & Plan     Problem List Items Addressed This Visit       Cardiovascular and Mediastinum   Essential (primary) hypertension   Relevant Medications   rosuvastatin (CRESTOR) 40 MG tablet     Endocrine   Diabetes mellitus, type 2 ([place]) - Primary   Relevant Medications   rosuvastatin (CRESTOR) 40 MG tablet    tirzepatide (MOUNJARO) 2.5 MG/0.5ML Pen   Other Relevant Orders   POCT glycosylated hemoglobin (Hb A1C) (Completed)     Other   HLD (hyperlipidemia)   Relevant Medications   rosuvastatin (CRESTOR) 40 MG tablet     Return in about 1 month (around 06/02/2022) for DM med f/u .      I, Barbara Foster, MD, have reviewed all documentation for this visit.  Portions of this information were initially documented by the CMA and reviewed by me for thoroughness and accuracy.      Barbara Foster, MD  [facility] [phone] (phone) [phone] (fax)  [place]

## 2022-05-03 ENCOUNTER — Ambulatory Visit: Payer: BC Managed Care – PPO | Admitting: Family Medicine

## 2022-05-03 ENCOUNTER — Encounter: Payer: Self-pay | Admitting: Family Medicine

## 2022-05-03 VITALS — BP 130/73 | HR 74 | Temp 98.2°F | Resp 16 | Ht 59.0 in | Wt 169.7 lb

## 2022-05-03 DIAGNOSIS — E119 Type 2 diabetes mellitus without complications: Secondary | ICD-10-CM

## 2022-05-03 DIAGNOSIS — E782 Mixed hyperlipidemia: Secondary | ICD-10-CM

## 2022-05-03 DIAGNOSIS — I10 Essential (primary) hypertension: Secondary | ICD-10-CM

## 2022-05-03 LAB — POCT GLYCOSYLATED HEMOGLOBIN (HGB A1C)
Est. average glucose Bld gHb Est-mCnc: 180
Hemoglobin A1C: 7.9 % — AB (ref 4.0–5.6)

## 2022-05-03 MED ORDER — TIRZEPATIDE 2.5 MG/0.5ML ~~LOC~~ SOAJ: 2.5000 mg | SUBCUTANEOUS | 1 refills | Status: DC

## 2022-05-03 MED ORDER — LANSOPRAZOLE 30 MG PO CPDR: 30.0000 mg | DELAYED_RELEASE_CAPSULE | Freq: Every day | ORAL | 0 refills | Status: DC | PRN

## 2022-05-03 MED ORDER — ROSUVASTATIN CALCIUM 40 MG PO TABS: 40.0000 mg | ORAL_TABLET | Freq: Every day | ORAL | 1 refills | Status: DC

## 2022-05-30 ENCOUNTER — Other Ambulatory Visit: Payer: Self-pay | Admitting: Family Medicine

## 2022-05-30 NOTE — Telephone Encounter (Signed)
Requested medication (s) are due for refill today: yes   Requested medication (s) are on the active medication list: yes   Last refill:  05/03/22 with 1 refill   Future visit scheduled: yes   Notes to clinic:   Pharmacy comment: Alternative Requested:[facility] [place]      Requested Prescriptions  Pending Prescriptions Disp Refills   MOUNJARO 2.5 MG/0.5ML Pen [Pharmacy Med Name: MOUNJARO 2.5 MG/0.5 ML PEN]  1    Sig: INJECT 2.5 MG SUBCUTANEOUSLY WEEKLY     Off-Protocol Failed - 05/30/2022 10:53 AM      Failed - Medication not assigned to a protocol, review manually.      Passed - Valid encounter within last 12 months    Recent Outpatient Visits           3 weeks ago Type 2 diabetes mellitus without complication, without long-term current use of insulin ([place])   [facility] Simmons-Robinson, [place], [place]   7 months ago Annual physical exam   [facility] Jerrol Banana., MD   1 year ago Type 2 diabetes mellitus without complication, without long-term current use of insulin [facility])   [facility] Jerrol Banana., MD   1 year ago Annual physical exam   [facility] Jerrol Banana., MD   1 year ago Type 2 diabetes mellitus without complication, without long-term current use of insulin [facility])   [facility] Jerrol Banana., MD       Future Appointments             Tomorrow [name], MD [facility], [place]   In 5 months Jerrol Banana., MD [facility], [place]

## 2022-05-31 ENCOUNTER — Encounter: Payer: Self-pay | Admitting: Family Medicine

## 2022-05-31 ENCOUNTER — Ambulatory Visit: Payer: BC Managed Care – PPO | Admitting: Family Medicine

## 2022-05-31 VITALS — BP 113/72 | HR 71 | Wt 162.5 lb

## 2022-05-31 DIAGNOSIS — R059 Cough, unspecified: Secondary | ICD-10-CM | POA: Diagnosis not present

## 2022-05-31 DIAGNOSIS — E119 Type 2 diabetes mellitus without complications: Secondary | ICD-10-CM | POA: Diagnosis not present

## 2022-05-31 MED ORDER — BENZONATATE 100 MG PO CAPS: 100.0000 mg | ORAL_CAPSULE | Freq: Two times a day (BID) | ORAL | 0 refills | Status: DC | PRN

## 2022-05-31 MED ORDER — MOUNJARO 2.5 MG/0.5ML ~~LOC~~ SOAJ: SUBCUTANEOUS | 3 refills | Status: DC

## 2022-05-31 NOTE — Progress Notes (Unsigned)
I,[name],acting as a Education administrator for [employer], MD.,have documented all relevant documentation on the behalf of Barbara Foster, MD,as directed by  Barbara Foster, MD while in the presence of Barbara Foster, MD.   Established patient visit   Patient: Barbara Holmes   DOB: April 09, 1961   61 y.o. Female  MRN: [id number] Visit Date: 05/31/2022  Today's healthcare provider: [name], MD   No chief complaint on file.  Subjective    HPI  -PA approved today for Mounjaro  Diabetes Mellitus Type II, Follow-up  Lab Results  Component Value Date   HGBA1C 7.9 (A) 05/03/2022   HGBA1C 7.1 (H) 10/26/2021   HGBA1C 6.7 (A) 04/26/2021   Wt Readings from Last 3 Encounters:  05/31/22 162 lb 8 oz (73.7 kg)  05/03/22 169 lb 11.2 oz (77 kg)  10/26/21 167 lb (75.8 kg)   Last seen for diabetes 1 months ago.  Management since then includes Mounjaro added. She reports excellent compliance with treatment. She is not having side effects.   Symptoms: No fatigue No foot ulcerations  No appetite changes No nausea  No paresthesia of the feet  No polydipsia  No polyuria No visual disturbances   No vomiting     Home blood sugar records: fasting range: 105  Episodes of hypoglycemia? No    Current insulin regiment:none   Pertinent Labs: Lab Results  Component Value Date   CHOL 115 10/26/2021   HDL 40 10/26/2021   LDLCALC 46 10/26/2021   TRIG 176 (H) 10/26/2021   CHOLHDL 2.9 10/26/2021   Lab Results  Component Value Date   NA 141 10/26/2021   K 4.3 10/26/2021   CREATININE 0.95 10/26/2021   EGFR 68 10/26/2021   MICROALBUR neg 09/13/2016   LABMICR 1,068.0 05/31/2022      Cough  Patient reports she has developed a cough that is bothersome throughout the day  She reports recent negative COVID test  She denies additional symptoms of URI  Denies  SOB  ------------------------------------------------------------------------------------------------  Medications: Outpatient Medications Prior to Visit  Medication Sig   albuterol (VENTOLIN HFA) 108 (90 Base) MCG/ACT inhaler Ventolin HFA 90 mcg/actuation aerosol inhaler  INHALE 2 PUFFS BY MOUTH EVERY 4 HOURS AS NEEDED FOR COUGH OR WHEEZING   celecoxib (CELEBREX) 200 MG capsule TAKE 1 CAPSULE (200 MG TOTAL) BY MOUTH AT BEDTIME AS NEEDED.   Cholecalciferol 1000 units capsule Take 1,000 Units by mouth daily.    FARXIGA 5 MG TABS tablet TAKE 1 TABLET (5 MG TOTAL) BY MOUTH DAILY.   lansoprazole (PREVACID) 30 MG capsule Take 1 capsule (30 mg total) by mouth daily as needed.   lisinopril (ZESTRIL) 20 MG tablet TAKE 1 TABLET BY MOUTH EVERY DAY   meclizine (ANTIVERT) 25 MG tablet meclizine 25 mg tablet  TAKE 1 TABLET BY MOUTH TWICE A DAY AS NEEDED   meloxicam (MOBIC) 15 MG tablet    metFORMIN (GLUCOPHAGE-XR) 500 MG 24 hr tablet TAKE 2 TABLETS BY MOUTH EVERY DAY WITH BREAKFAST   rosuvastatin (CRESTOR) 40 MG tablet Take 1 tablet (40 mg total) by mouth daily.   traMADol (ULTRAM) 50 MG tablet TAKE 1 TO 2 TABLETS BY MOUTH EVERY DAY AT BEDTIME   [DISCONTINUED] MOUNJARO 2.5 MG/0.5ML Pen INJECT 2.5 MG SUBCUTANEOUSLY WEEKLY   No facility-administered medications prior to visit.    Review of Systems     Objective    BP 113/72 (BP Location: Left Arm, Patient Position: Sitting, Cuff Size: Normal)   Pulse 71  Wt 162 lb 8 oz (73.7 kg)   SpO2 96%   BMI 32.82 kg/m    Physical Exam  General: female appearing stated age in no acute distress Cardio: Normal S1 and S2, no S3 or S4. Rhythm is regular. No murmurs or rubs.  Bilateral radial pulses palpable Pulm: Clear to auscultation bilaterally, no crackles, wheezing, or diminished breath sounds. Normal respiratory effort, stable on RA Abdomen: Bowel sounds normal. Abdomen soft and non-tender.  Extremities: No peripheral edema. Warm/ well perfused.   Neuro: pt alert and oriented x4  Feet: monofilament testing sensed 6/6 sites bilaterally, no signs of skin breakdown nor ulcerations of feet, normal appearing toenails, dorsalis pedis and posterior tibialis pulses palpated bilaterally    Results for orders placed or performed in visit on 05/31/22  Urine microalbumin-creatinine with uACR  Result Value Ref Range   Creatinine, Urine 307.4 Not Estab. mg/dL   Microalbumin, Urine 1,068.0 Not Estab. ug/mL   Microalb/Creat Ratio 347 (H) 0 - 29 mg/g creat    Assessment & Plan     Problem List Items Addressed This Visit       Endocrine   Diabetes mellitus, type 2 ([place]) - Primary    A1c not yet at goal  Repeat A1c elevated from previous level  Lab Results  Component Value Date   HGBA1C 7.9 (A) 05/03/2022  Previously unable to obtain mounjaro prescription due to coverage problems  Will re-submit prescription to pharmacy for 2.75m weekly dose for four weeks  Urine microalbumin collected today       Relevant Medications   tirzepatide (MOUNJARO) 2.5 MG/0.5ML Pen   Other Relevant Orders   Urine microalbumin-creatinine with uACR (Completed)     Other   Cough in adult patient    Acute cough  Suspect bronchitis  Pulmonary exam does not prove to have concerning symptoms for PNA at this time  She has no wheezing on exam or other signs of respiratory distress  Recommended drinking warm teas with honey, throat lozenges, OTC cough remedies and reviewed return precautions  Tesslon perles prescribed for 1025mBID           Return in about 2 months (around 08/01/2022).      I, MaEulis FosterMD, have reviewed all documentation for this visit.  Portions of this information were initially documented by the CMA and reviewed by me for thoroughness and accuracy.      MaEulis FosterMD  [facility] [phone]) [phone])  [place]

## 2022-06-01 DIAGNOSIS — R059 Cough, unspecified: Secondary | ICD-10-CM | POA: Insufficient documentation

## 2022-06-01 LAB — MICROALBUMIN / CREATININE URINE RATIO
Creatinine, Urine: 307.4 mg/dL
Microalb/Creat Ratio: 347 mg/g creat — ABNORMAL HIGH (ref 0–29)
Microalbumin, Urine: 1068 ug/mL

## 2022-06-01 NOTE — Assessment & Plan Note (Signed)
Acute cough  Suspect bronchitis  Pulmonary exam does not prove to have concerning symptoms for PNA at this time  She has no wheezing on exam or other signs of respiratory distress  Recommended drinking warm teas with honey, throat lozenges, OTC cough remedies and reviewed return precautions  Tesslon perles prescribed for '100mg'$  BID

## 2022-06-01 NOTE — Assessment & Plan Note (Signed)
A1c not yet at goal  Repeat A1c elevated from previous level  Lab Results  Component Value Date   HGBA1C 7.9 (A) 05/03/2022   Previously unable to obtain mounjaro prescription due to coverage problems  Will re-submit prescription to pharmacy for 2.'5mg'$  weekly dose for four weeks  Urine microalbumin collected today

## 2022-06-07 DIAGNOSIS — J209 Acute bronchitis, unspecified: Secondary | ICD-10-CM | POA: Diagnosis not present

## 2022-06-14 ENCOUNTER — Ambulatory Visit
Admission: RE | Admit: 2022-06-14 | Discharge: 2022-06-14 | Disposition: A | Discharge status: Home / Self Care | Payer: BC Managed Care – PPO | Attending: Family | Admitting: Family

## 2022-06-14 ENCOUNTER — Other Ambulatory Visit: Payer: Self-pay | Admitting: Family

## 2022-06-14 ENCOUNTER — Ambulatory Visit
Admission: RE | Admit: 2022-06-14 | Discharge: 2022-06-14 | Disposition: A | Discharge status: Home / Self Care | Payer: BC Managed Care – PPO | Source: Ambulatory Visit | Attending: Family | Admitting: Family

## 2022-06-14 ENCOUNTER — Other Ambulatory Visit: Payer: Self-pay | Admitting: Family Medicine

## 2022-06-14 DIAGNOSIS — R051 Acute cough: Secondary | ICD-10-CM | POA: Diagnosis not present

## 2022-06-14 DIAGNOSIS — G2581 Restless legs syndrome: Secondary | ICD-10-CM

## 2022-06-14 DIAGNOSIS — R059 Cough, unspecified: Secondary | ICD-10-CM | POA: Diagnosis not present

## 2022-06-14 DIAGNOSIS — J209 Acute bronchitis, unspecified: Secondary | ICD-10-CM | POA: Diagnosis not present

## 2022-06-14 DIAGNOSIS — R112 Nausea with vomiting, unspecified: Secondary | ICD-10-CM | POA: Diagnosis not present

## 2022-06-14 NOTE — Telephone Encounter (Signed)
Requested medication (s) are due for refill today: yes  Requested medication (s) are on the active medication list: yes  Last refill:  01/16/22 #60 2 RF  Future visit scheduled: yes  Notes to clinic:  no refill protocol for this med   Requested Prescriptions  Pending Prescriptions Disp Refills   traMADol (ULTRAM) 50 MG tablet 60 tablet 2     There is no refill protocol information for this order

## 2022-06-14 NOTE — Telephone Encounter (Signed)
Medication Refill - Medication: traMADol (ULTRAM) 50 MG tablet   Has the patient contacted their pharmacy? Yes.     Preferred Pharmacy (with phone number or street name):  [id number] [place]: [phone] Fax: [phone]    Has the patient been seen for an appointment in the last year OR does the patient have an upcoming appointment? Yes.    The patient is completely out of her meds and called her pharmacy a few days ago and they said they hadn't heard back from the provider. She needs this for her restless legs. Please assist patient further.

## 2022-06-15 MED ORDER — TRAMADOL HCL 50 MG PO TABS: ORAL_TABLET | ORAL | 0 refills | Status: DC

## 2022-06-15 NOTE — Telephone Encounter (Signed)
Refill submitted for Tramadol '50mg'$  1-2 tablets daily.   PDMP reviewed, no prior reports listed.    [name], MD  [facility]

## 2022-06-16 ENCOUNTER — Encounter: Payer: Self-pay | Admitting: Family Medicine

## 2022-07-16 ENCOUNTER — Other Ambulatory Visit: Payer: Self-pay | Admitting: Family Medicine

## 2022-07-16 DIAGNOSIS — G2581 Restless legs syndrome: Secondary | ICD-10-CM

## 2022-07-20 ENCOUNTER — Other Ambulatory Visit: Payer: Self-pay | Admitting: Family Medicine

## 2022-08-02 ENCOUNTER — Ambulatory Visit: Payer: BC Managed Care – PPO | Admitting: Family Medicine

## 2022-08-11 NOTE — Progress Notes (Signed)
I,[name],acting as a scribe for [employer], MD.,have documented all relevant documentation on the behalf of [name], MD,as directed by  [name], MD while in the presence of [name], MD.  Established patient visit   Patient: Barbara Holmes   DOB: 09/14/1960   62 y.o. Female  MRN: [id number] Visit Date: 08/14/2022  Today's healthcare provider: [name], MD   Chief Complaint  Patient presents with   Follow-up   Subjective    HPI  Diabetes Mellitus Type II, Follow-up  Lab Results  Component Value Date   HGBA1C 7.6 (A) 08/14/2022   HGBA1C 7.9 (A) 05/03/2022   HGBA1C 7.1 (H) 10/26/2021   Wt Readings from Last 3 Encounters:  08/14/22 163 lb (73.9 kg)  05/31/22 162 lb 8 oz (73.7 kg)  05/03/22 169 lb 11.2 oz (77 kg)   Last seen for diabetes 3 months ago.  Management since then includes re-submit prescription to pharmacy for 2.'5mg'$  Mounjaro, continue Farxiga '5mg'$  daily, metformin '1000mg'$  daily.  Patient hasn't been on [facility] since December. Patient states that she was doing well on the 2.'5mg'$  dose of Mounjaro without side effects.    She reports excellent compliance with treatment. She is not having side effects.   Symptoms: No fatigue No foot ulcerations  No appetite changes NO nausea  No paresthesia of the feet  No polydipsia  No polyuria No visual disturbances   No vomiting     Home blood sugar records:  132-150  Episodes of hypoglycemia? No    Current insulin regiment: not on insulin  Most Recent Eye Exam: June 2022, due for updated eye exam, has an appt scheduled in May, she sees [facility]   Pertinent Labs: Lab Results  Component Value Date   CHOL 115 10/26/2021   HDL 40 10/26/2021   LDLCALC 46 10/26/2021   TRIG 176 (H) 10/26/2021   CHOLHDL 2.9 10/26/2021   Lab Results  Component Value Date   NA 141 10/26/2021   K 4.3 10/26/2021   CREATININE 0.95  10/26/2021   EGFR 68 10/26/2021   LABMICR 1,068.0 05/31/2022   MICRALBCREAT 347 (H) 05/31/2022     ---------------------------------------------------------------------------------------------------   Restless Leg Syndrome  She has been taking Tramadol '50mg'$  -'100mg'$  at bedtime as needed for these symtpms  Symptoms are well controlled on this regimen  She was unable to tolerate the requip due to nausea     Medications: Outpatient Medications Prior to Visit  Medication Sig   albuterol (VENTOLIN HFA) 108 (90 Base) MCG/ACT inhaler Ventolin HFA 90 mcg/actuation aerosol inhaler  INHALE 2 PUFFS BY MOUTH EVERY 4 HOURS AS NEEDED FOR COUGH OR WHEEZING   benzonatate (TESSALON) 100 MG capsule Take 1 capsule (100 mg total) by mouth 2 (two) times daily as needed for cough.   celecoxib (CELEBREX) 200 MG capsule TAKE 1 CAPSULE (200 MG TOTAL) BY MOUTH AT BEDTIME AS NEEDED.   Cholecalciferol 1000 units capsule Take 1,000 Units by mouth daily.    FARXIGA 5 MG TABS tablet TAKE 1 TABLET (5 MG TOTAL) BY MOUTH DAILY.   lansoprazole (PREVACID) 30 MG capsule TAKE 1 CAPSULE (30 MG TOTAL) BY MOUTH DAILY AS NEEDED.   lisinopril (ZESTRIL) 20 MG tablet TAKE 1 TABLET BY MOUTH EVERY DAY   meclizine (ANTIVERT) 25 MG tablet meclizine 25 mg tablet  TAKE 1 TABLET BY MOUTH TWICE A DAY AS NEEDED   meloxicam (MOBIC) 15 MG tablet    metFORMIN (GLUCOPHAGE-XR) 500 MG 24  hr tablet TAKE 2 TABLETS BY MOUTH EVERY DAY WITH BREAKFAST   rosuvastatin (CRESTOR) 40 MG tablet Take 1 tablet (40 mg total) by mouth daily.   [DISCONTINUED] tirzepatide (MOUNJARO) 2.5 MG/0.5ML Pen INJECT 2.5 MG SUBCUTANEOUSLY WEEKLY   [DISCONTINUED] traMADol (ULTRAM) 50 MG tablet TAKE 1 TO 2 TABLETS BY MOUTH EVERY DAY AT BEDTIME   No facility-administered medications prior to visit.    Review of Systems     Objective    BP 128/80 (BP Location: Right Arm, Patient Position: Sitting, Cuff Size: Normal)   Pulse 87   Resp 16   Wt 163 lb (73.9 kg)   BMI  32.92 kg/m    Physical Exam Vitals reviewed.  Constitutional:      General: She is not in acute distress.    Appearance: Normal appearance. She is not ill-appearing, toxic-appearing or diaphoretic.  Eyes:     Conjunctiva/sclera: Conjunctivae normal.  Neck:     Thyroid: No thyroid mass, thyromegaly or thyroid tenderness.     Vascular: No carotid bruit.  Cardiovascular:     Rate and Rhythm: Normal rate and regular rhythm.     Pulses: Normal pulses.     Heart sounds: Normal heart sounds. No murmur heard.    No friction rub. No gallop.  Pulmonary:     Effort: Pulmonary effort is normal. No respiratory distress.     Breath sounds: Normal breath sounds. No stridor. No wheezing, rhonchi or rales.  Abdominal:     General: Bowel sounds are normal. There is no distension.     Palpations: Abdomen is soft.     Tenderness: There is no abdominal tenderness.  Musculoskeletal:     Right lower leg: No edema.     Left lower leg: No edema.  Lymphadenopathy:     Cervical: No cervical adenopathy.  Skin:    Findings: No erythema or rash.  Neurological:     Mental Status: She is alert and oriented to person, place, and time.        Results for orders placed or performed in visit on 08/14/22  POCT glycosylated hemoglobin (Hb A1C)  Result Value Ref Range   Hemoglobin A1C 7.6 (A) 4.0 - 5.6 %   Est. average glucose Bld gHb Est-mCnc 171     Assessment & Plan     Problem List Items Addressed This Visit       Cardiovascular and Mediastinum   Essential (primary) hypertension - Primary    Controlled BP at goal Continue current medications: lisinopril '20mg'$  daily  No medications changes today          Endocrine   Diabetes mellitus, type 2 (HCC)    Chronic  A1c repeated today, improved from 7.9 to 7.6  Will resubmit RX for mounjaro for insurance coverage, increased to '5mg'$  weekly  Suspect patient would likely move A1c into goal range with continued GLP1 agonist medication          Relevant Medications   tirzepatide (MOUNJARO) 5 MG/0.5ML Pen   Other Relevant Orders   POCT glycosylated hemoglobin (Hb A1C) (Completed)     Other   Restless leg    Chronic  Symptoms controlled on tramadol 50-'100mg'$  qHS  She has not been able to tolerate requip for this problem due to SE  Refills provided  PDMP reviewed and appropriate, last fill 07/17/22       Other Visit Diagnoses     RLS (restless legs syndrome)       Relevant Medications  traMADol (ULTRAM) 50 MG tablet        Return in about 3 months (around 10/31/2022) for CPE.       The entirety of the information documented in the History of Present Illness, Review of Systems and Physical Exam were personally obtained by me. Portions of this information were initially documented by Lyndel Pleasure, CMA . I, [name], MD have reviewed the documentation above for thoroughness and accuracy.      [name], MD  [facility] [phone] (phone) [phone] (fax)  [place]

## 2022-08-14 ENCOUNTER — Encounter: Payer: Self-pay | Admitting: Family Medicine

## 2022-08-14 ENCOUNTER — Ambulatory Visit: Payer: BC Managed Care – PPO | Admitting: Family Medicine

## 2022-08-14 VITALS — BP 128/80 | HR 87 | Resp 16 | Wt 163.0 lb

## 2022-08-14 DIAGNOSIS — E119 Type 2 diabetes mellitus without complications: Secondary | ICD-10-CM

## 2022-08-14 DIAGNOSIS — G2581 Restless legs syndrome: Secondary | ICD-10-CM | POA: Diagnosis not present

## 2022-08-14 DIAGNOSIS — I10 Essential (primary) hypertension: Secondary | ICD-10-CM | POA: Diagnosis not present

## 2022-08-14 LAB — POCT GLYCOSYLATED HEMOGLOBIN (HGB A1C)
Est. average glucose Bld gHb Est-mCnc: 171
Hemoglobin A1C: 7.6 % — AB (ref 4.0–5.6)

## 2022-08-14 MED ORDER — TRAMADOL HCL 50 MG PO TABS: 50.0000 mg | ORAL_TABLET | Freq: Every evening | ORAL | 0 refills | Status: DC | PRN

## 2022-08-14 MED ORDER — TIRZEPATIDE 5 MG/0.5ML ~~LOC~~ SOAJ: 5.0000 mg | SUBCUTANEOUS | 0 refills | Status: DC

## 2022-08-14 NOTE — Assessment & Plan Note (Signed)
Controlled BP at goal Continue current medications: lisinopril '20mg'$  daily  No medications changes today

## 2022-08-14 NOTE — Assessment & Plan Note (Addendum)
Chronic  A1c repeated today, improved from 7.9 to 7.6  Will resubmit RX for mounjaro for insurance coverage, increased to '5mg'$  weekly  Suspect patient would likely move A1c into goal range with continued GLP1 agonist medication

## 2022-08-14 NOTE — Assessment & Plan Note (Addendum)
Chronic  Symptoms controlled on tramadol 50-'100mg'$  qHS  She has not been able to tolerate requip for this problem due to SE  Refills provided  PDMP reviewed and appropriate, last fill 07/17/22

## 2022-08-28 DIAGNOSIS — R42 Dizziness and giddiness: Secondary | ICD-10-CM | POA: Diagnosis not present

## 2022-08-28 DIAGNOSIS — R112 Nausea with vomiting, unspecified: Secondary | ICD-10-CM | POA: Diagnosis not present

## 2022-09-04 ENCOUNTER — Inpatient Hospital Stay: Payer: BC Managed Care – PPO

## 2022-09-04 ENCOUNTER — Other Ambulatory Visit: Payer: Self-pay

## 2022-09-04 ENCOUNTER — Emergency Department: Payer: BC Managed Care – PPO

## 2022-09-04 ENCOUNTER — Inpatient Hospital Stay
Admission: EM | Admit: 2022-09-04 | Discharge: 2022-09-08 | DRG: 641 | Disposition: A | Discharge status: Home / Self Care | Payer: BC Managed Care – PPO | Attending: Internal Medicine | Admitting: Internal Medicine

## 2022-09-04 ENCOUNTER — Encounter: Payer: Self-pay | Admitting: Family Medicine

## 2022-09-04 DIAGNOSIS — F17210 Nicotine dependence, cigarettes, uncomplicated: Secondary | ICD-10-CM | POA: Diagnosis not present

## 2022-09-04 DIAGNOSIS — K529 Noninfective gastroenteritis and colitis, unspecified: Secondary | ICD-10-CM | POA: Diagnosis present

## 2022-09-04 DIAGNOSIS — Z8249 Family history of ischemic heart disease and other diseases of the circulatory system: Secondary | ICD-10-CM

## 2022-09-04 DIAGNOSIS — R5381 Other malaise: Secondary | ICD-10-CM | POA: Diagnosis present

## 2022-09-04 DIAGNOSIS — E785 Hyperlipidemia, unspecified: Secondary | ICD-10-CM | POA: Diagnosis not present

## 2022-09-04 DIAGNOSIS — R7401 Elevation of levels of liver transaminase levels: Secondary | ICD-10-CM | POA: Insufficient documentation

## 2022-09-04 DIAGNOSIS — D631 Anemia in chronic kidney disease: Secondary | ICD-10-CM | POA: Diagnosis not present

## 2022-09-04 DIAGNOSIS — E1165 Type 2 diabetes mellitus with hyperglycemia: Secondary | ICD-10-CM | POA: Diagnosis present

## 2022-09-04 DIAGNOSIS — Z7984 Long term (current) use of oral hypoglycemic drugs: Secondary | ICD-10-CM

## 2022-09-04 DIAGNOSIS — E860 Dehydration: Secondary | ICD-10-CM | POA: Diagnosis not present

## 2022-09-04 DIAGNOSIS — N186 End stage renal disease: Secondary | ICD-10-CM | POA: Diagnosis present

## 2022-09-04 DIAGNOSIS — E669 Obesity, unspecified: Secondary | ICD-10-CM | POA: Diagnosis present

## 2022-09-04 DIAGNOSIS — I10 Essential (primary) hypertension: Secondary | ICD-10-CM | POA: Diagnosis not present

## 2022-09-04 DIAGNOSIS — Z833 Family history of diabetes mellitus: Secondary | ICD-10-CM | POA: Diagnosis not present

## 2022-09-04 DIAGNOSIS — Z7985 Long-term (current) use of injectable non-insulin antidiabetic drugs: Secondary | ICD-10-CM

## 2022-09-04 DIAGNOSIS — Z1152 Encounter for screening for COVID-19: Secondary | ICD-10-CM

## 2022-09-04 DIAGNOSIS — N189 Chronic kidney disease, unspecified: Secondary | ICD-10-CM | POA: Diagnosis not present

## 2022-09-04 DIAGNOSIS — Z83438 Family history of other disorder of lipoprotein metabolism and other lipidemia: Secondary | ICD-10-CM | POA: Diagnosis not present

## 2022-09-04 DIAGNOSIS — Z87891 Personal history of nicotine dependence: Secondary | ICD-10-CM

## 2022-09-04 DIAGNOSIS — R809 Proteinuria, unspecified: Secondary | ICD-10-CM | POA: Diagnosis present

## 2022-09-04 DIAGNOSIS — D649 Anemia, unspecified: Secondary | ICD-10-CM | POA: Diagnosis present

## 2022-09-04 DIAGNOSIS — R112 Nausea with vomiting, unspecified: Secondary | ICD-10-CM | POA: Diagnosis not present

## 2022-09-04 DIAGNOSIS — Z803 Family history of malignant neoplasm of breast: Secondary | ICD-10-CM | POA: Diagnosis not present

## 2022-09-04 DIAGNOSIS — Z6833 Body mass index (BMI) 33.0-33.9, adult: Secondary | ICD-10-CM

## 2022-09-04 DIAGNOSIS — E119 Type 2 diabetes mellitus without complications: Secondary | ICD-10-CM

## 2022-09-04 DIAGNOSIS — R001 Bradycardia, unspecified: Secondary | ICD-10-CM | POA: Diagnosis not present

## 2022-09-04 DIAGNOSIS — I700 Atherosclerosis of aorta: Secondary | ICD-10-CM | POA: Diagnosis not present

## 2022-09-04 DIAGNOSIS — Z90710 Acquired absence of both cervix and uterus: Secondary | ICD-10-CM

## 2022-09-04 DIAGNOSIS — E861 Hypovolemia: Secondary | ICD-10-CM | POA: Diagnosis present

## 2022-09-04 DIAGNOSIS — I120 Hypertensive chronic kidney disease with stage 5 chronic kidney disease or end stage renal disease: Secondary | ICD-10-CM | POA: Diagnosis not present

## 2022-09-04 DIAGNOSIS — Z79899 Other long term (current) drug therapy: Secondary | ICD-10-CM

## 2022-09-04 DIAGNOSIS — R42 Dizziness and giddiness: Secondary | ICD-10-CM | POA: Diagnosis not present

## 2022-09-04 DIAGNOSIS — R197 Diarrhea, unspecified: Secondary | ICD-10-CM | POA: Diagnosis not present

## 2022-09-04 DIAGNOSIS — N2581 Secondary hyperparathyroidism of renal origin: Secondary | ICD-10-CM | POA: Diagnosis present

## 2022-09-04 DIAGNOSIS — E1169 Type 2 diabetes mellitus with other specified complication: Secondary | ICD-10-CM | POA: Diagnosis not present

## 2022-09-04 DIAGNOSIS — N179 Acute kidney failure, unspecified: Secondary | ICD-10-CM | POA: Diagnosis not present

## 2022-09-04 DIAGNOSIS — R109 Unspecified abdominal pain: Secondary | ICD-10-CM | POA: Diagnosis not present

## 2022-09-04 LAB — SARS CORONAVIRUS 2 BY RT PCR: SARS Coronavirus 2 by RT PCR: NEGATIVE

## 2022-09-04 LAB — COMPREHENSIVE METABOLIC PANEL
ALT: 240 U/L — ABNORMAL HIGH (ref 0–44)
AST: 233 U/L — ABNORMAL HIGH (ref 15–41)
Albumin: 3.7 g/dL (ref 3.5–5.0)
Alkaline Phosphatase: 70 U/L (ref 38–126)
Anion gap: 22 — ABNORMAL HIGH (ref 5–15)
BUN: 129 mg/dL — ABNORMAL HIGH (ref 8–23)
CO2: 24 mmol/L (ref 22–32)
Calcium: 9.1 mg/dL (ref 8.9–10.3)
Chloride: 96 mmol/L — ABNORMAL LOW (ref 98–111)
Creatinine, Ser: 17.3 mg/dL — ABNORMAL HIGH (ref 0.44–1.00)
GFR, Estimated: 2 mL/min — ABNORMAL LOW (ref >60)
Glucose, Bld: 101 mg/dL — ABNORMAL HIGH (ref 70–99)
Potassium: 5.6 mmol/L — ABNORMAL HIGH (ref 3.5–5.1)
Sodium: 142 mmol/L (ref 135–145)
Total Bilirubin: 0.6 mg/dL (ref 0.3–1.2)
Total Protein: 7.6 g/dL (ref 6.5–8.1)

## 2022-09-04 LAB — CBC
HCT: 34.2 % — ABNORMAL LOW (ref 36.0–46.0)
Hemoglobin: 11.4 g/dL — ABNORMAL LOW (ref 12.0–15.0)
MCH: 29.5 pg (ref 26.0–34.0)
MCHC: 33.3 g/dL (ref 30.0–36.0)
MCV: 88.4 fL (ref 80.0–100.0)
Platelets: 227 10*3/uL (ref 150–400)
RBC: 3.87 MIL/uL (ref 3.87–5.11)
RDW: 12.7 % (ref 11.5–15.5)
WBC: 9.3 10*3/uL (ref 4.0–10.5)
nRBC: 0 % (ref 0.0–0.2)

## 2022-09-04 LAB — URINALYSIS, ROUTINE W REFLEX MICROSCOPIC
Bilirubin Urine: NEGATIVE
Glucose, UA: 150 mg/dL — AB
Ketones, ur: NEGATIVE mg/dL
Leukocytes,Ua: NEGATIVE
Nitrite: NEGATIVE
Protein, ur: 100 mg/dL — AB
Specific Gravity, Urine: 1.011 (ref 1.005–1.030)
pH: 6 (ref 5.0–8.0)

## 2022-09-04 LAB — LACTATE DEHYDROGENASE: LDH: 450 U/L — ABNORMAL HIGH (ref 98–192)

## 2022-09-04 LAB — CK: Total CK: 50 U/L (ref 38–234)

## 2022-09-04 LAB — LIPASE, BLOOD: Lipase: 104 U/L — ABNORMAL HIGH (ref 11–51)

## 2022-09-04 LAB — PROTEIN / CREATININE RATIO, URINE
Creatinine, Urine: 84 mg/dL
Protein Creatinine Ratio: 1.36 mg/mg{Cre} — ABNORMAL HIGH (ref 0.00–0.15)
Total Protein, Urine: 114 mg/dL

## 2022-09-04 LAB — CREATININE, URINE, RANDOM: Creatinine, Urine: 86 mg/dL

## 2022-09-04 LAB — CBG MONITORING, ED: Glucose-Capillary: 93 mg/dL (ref 70–99)

## 2022-09-04 LAB — SODIUM, URINE, RANDOM: Sodium, Ur: 57 mmol/L

## 2022-09-04 LAB — ACETAMINOPHEN LEVEL: Acetaminophen (Tylenol), Serum: <10 ug/mL — ABNORMAL LOW (ref 10–30)

## 2022-09-04 MED ORDER — HEPARIN SODIUM (PORCINE) 5000 UNIT/ML IJ SOLN
5000.0000 [IU] | Freq: Three times a day (TID) | INTRAMUSCULAR | Status: DC
  Administered 2022-09-04 – 2022-09-08 (×11): 5000 [IU] via SUBCUTANEOUS
  Filled 2022-09-04 (×12): qty 1

## 2022-09-04 MED ORDER — ONDANSETRON HCL 4 MG/2ML IJ SOLN
4.0000 mg | Freq: Once | INTRAMUSCULAR | Status: AC
  Administered 2022-09-04: 4 mg via INTRAVENOUS
  Filled 2022-09-04: qty 2

## 2022-09-04 MED ORDER — SODIUM CHLORIDE 0.9 % IV BOLUS
1000.0000 mL | Freq: Once | INTRAVENOUS | Status: AC
  Administered 2022-09-04: 1000 mL via INTRAVENOUS

## 2022-09-04 MED ORDER — INSULIN ASPART 100 UNIT/ML IJ SOLN
0.0000 [IU] | Freq: Three times a day (TID) | INTRAMUSCULAR | Status: DC
  Administered 2022-09-06 – 2022-09-08 (×2): 1 [IU] via SUBCUTANEOUS
  Filled 2022-09-04 (×2): qty 1

## 2022-09-04 MED ORDER — OXYCODONE HCL 5 MG PO TABS
5.0000 mg | ORAL_TABLET | Freq: Once | ORAL | Status: AC
  Administered 2022-09-04: 5 mg via ORAL
  Filled 2022-09-04: qty 1

## 2022-09-04 MED ORDER — SODIUM CHLORIDE 0.9 % IV SOLN
2.0000 g | INTRAVENOUS | Status: DC
  Administered 2022-09-05: 2 g via INTRAVENOUS
  Filled 2022-09-04: qty 20
  Filled 2022-09-04: qty 2

## 2022-09-04 MED ORDER — SODIUM CHLORIDE 0.9 % IV SOLN: Freq: Once | INTRAVENOUS | Status: DC

## 2022-09-04 MED ORDER — ONDANSETRON HCL 4 MG PO TABS: 4.0000 mg | ORAL_TABLET | Freq: Four times a day (QID) | ORAL | Status: DC | PRN

## 2022-09-04 MED ORDER — ONDANSETRON HCL 4 MG/2ML IJ SOLN
4.0000 mg | Freq: Four times a day (QID) | INTRAMUSCULAR | Status: DC | PRN
  Administered 2022-09-05 – 2022-09-07 (×3): 4 mg via INTRAVENOUS
  Filled 2022-09-04 (×3): qty 2

## 2022-09-04 MED ORDER — SODIUM CHLORIDE 0.9 % IV SOLN
2.0000 g | Freq: Once | INTRAVENOUS | Status: AC
  Administered 2022-09-04: 2 g via INTRAVENOUS
  Filled 2022-09-04: qty 20

## 2022-09-04 MED ORDER — STERILE WATER FOR INJECTION IV SOLN
INTRAVENOUS | Status: DC
  Filled 2022-09-04 (×6): qty 1000
  Filled 2022-09-04: qty 150

## 2022-09-04 MED ORDER — METRONIDAZOLE 500 MG/100ML IV SOLN
500.0000 mg | Freq: Once | INTRAVENOUS | Status: AC
  Administered 2022-09-04: 500 mg via INTRAVENOUS
  Filled 2022-09-04: qty 100

## 2022-09-04 NOTE — ED Notes (Signed)
To bedside to assess pt; pt away at imaging.  °

## 2022-09-04 NOTE — Assessment & Plan Note (Signed)
Patient with new onset severe renal failure with creatinine of 17.3 with baseline creatinine around 1 Noted concurrent colitis with decreased p.o. intake Also with home ACE inhibitor use BUN/creatinine ratio around 7 Urinalysis is pending Formal nephrology consult pending Started on hypotonic fluids per nephrology recommendations Add on renal ultrasound Check CK level

## 2022-09-04 NOTE — Assessment & Plan Note (Addendum)
Patient with new onset severe renal failure with creatinine of 17.3 with baseline creatinine around 1 Noted concurrent colitis with decreased p.o. intake Also with home ACE inhibitor use BUN/creatinine ratio around 7 (? Intrinsic renal injury/ATN)  Urinalysis is pending-pt still making urine  No significant acute renal/ureteral pathology noted on CT A&P Formal nephrology consult pending Started on hypotonic fluids per nephrology recommendations Add on renal ultrasound Checking  CK level Check FENa As a discussion, HUS a consideration on DDX given active colitis,  however pt afebrile, hgb 11.4, plt 227, and neurologically intact at present  F/u stool studies  Appreciate nephrology input

## 2022-09-04 NOTE — ED Notes (Signed)
Pt denies SOB, COPD hx or breathing shallow due to pain.

## 2022-09-04 NOTE — Assessment & Plan Note (Signed)
AST 233, ALT 240 ?  Ischemic injury in the setting of colitis and AKI Will check Tylenol level, acute hepatitis panel CT abdomen and pelvis negative for any hepatobiliary disease Trend with hydration Abd u/s as clinically indicated  Follow

## 2022-09-04 NOTE — ED Notes (Signed)
Pt placed on 2L O2 via [place].

## 2022-09-04 NOTE — ED Notes (Signed)
Pt removed from oxygen at this time

## 2022-09-04 NOTE — Assessment & Plan Note (Signed)
Positive nausea and abdominal pain x 1 week with noted nonspecific colitis on CT  Will place on empiric Rocephin and azithromycin for infectious coverage C. difficile and stool panel obtained Will otherwise monitor for now

## 2022-09-04 NOTE — ED Notes (Signed)
To bedside to collect 2nd set of cultures and hang meds. Pt adamant needs to use restroom first. Pt allowed up to bathroom.

## 2022-09-04 NOTE — Progress Notes (Signed)
       CROSS COVER NOTE  NAME: Barbara Holmes MRN: [id number] DOB : September 29, 1960 ATTENDING PHYSICIAN: Deneise Lever, MD    Date of Service   09/04/2022   HPI/Events of Note   Report/Request ***. just received this pt from ED, here w/n/v, abd. pain. she has some newly dx kidney disease as well; currently c/o abdominal cramps, 7/10 & constant (all day). I have no PRN's at this time, is there anything we could try to relieve the discomfort?  On Review of chart *** Bedside eval*** HPI***  Interventions   Assessment/Plan: X X X    *** professional thanks      To reach the provider On-Call:   7AM- 7PM see care teams to locate the attending and reach out to them via [email or web address] Password: [email or web address] 7PM-7AM contact night-coverage If you still have difficulty reaching the appropriate provider, please page the [facility] (Director on Call) for [facility] on amion for assistance  This document was prepared using Systems analyst and may include unintentional dictation errors.  Neomia Glass DNP, MBA, FNP-BC, PMHNP-BC Nurse Practitioner [facility] Pager [phone]

## 2022-09-04 NOTE — ED Provider Notes (Signed)
[facility] Provider Note    Event Date/Time   First MD Initiated Contact with Patient 09/04/22 1248     (approximate)   History   Emesis and Abdominal Pain   HPI  Barbara Holmes is a 62 y.o. female  here with n/v/d and weakness for 1.5 weeks. Sx started with vertigo episode last week for which she was given meclizine. Since then, the vertigo resolved but she then developed worsening nausea, vomtiing, diarrhea.  She has been essentially unable to keep anything down for the last week.  No blood in her emesis or diarrhea.  She has had diffuse abdominal cramping, particularly after vomiting.  Denies any fevers or chills.  She has had decreased urine output but no dysuria.  No other complaints.     Physical Exam   Triage Vital Signs: ED Triage Vitals  Enc Vitals Group     BP 09/04/22 1153 (!) 146/62     Pulse Rate 09/04/22 1153 (!) 57     Resp 09/04/22 1153 16     Temp 09/04/22 1153 97.7 F (36.5 C)     Temp Source 09/04/22 1153 Oral     SpO2 09/04/22 1153 95 %     Weight 09/04/22 1153 159 lb (72.1 kg)     Height 09/04/22 1153 4\' 11"  (1.499 m)     Head Circumference --      Peak Flow --      Pain Score 09/04/22 1155 4     Pain Loc --      Pain Edu? --      Excl. in [place]? --     Most recent vital signs: Vitals:   09/04/22 1825 09/04/22 1935  BP: (!) 127/50 (!) 111/50  Pulse: (!) 50 (!) 49  Resp: 14 14  Temp:    SpO2: 96% 98%     General: Awake, no distress.  CV:  Good peripheral perfusion.  Regular rate and rhythm. Resp:  Normal work of breathing.  Mild tachypnea noted. Abd:  No distention.  Mild diffuse lower abdominal tenderness.  No rebound or guarding. Other:  Dry mucous membranes.  Alert, oriented.   ED Results / Procedures / Treatments   Labs (all labs ordered are listed, but only abnormal results are displayed) Labs Reviewed  LIPASE, BLOOD - Abnormal; Notable for the following components:      Result Value   Lipase 104 (*)     All other components within normal limits  COMPREHENSIVE METABOLIC PANEL - Abnormal; Notable for the following components:   Potassium 5.6 (*)    Chloride 96 (*)    Glucose, Bld 101 (*)    BUN 129 (*)    Creatinine, Ser 17.30 (*)    AST 233 (*)    ALT 240 (*)    GFR, Estimated 2 (*)    Anion gap 22 (*)    All other components within normal limits  CBC - Abnormal; Notable for the following components:   Hemoglobin 11.4 (*)    HCT 34.2 (*)    All other components within normal limits  URINALYSIS, ROUTINE W REFLEX MICROSCOPIC - Abnormal; Notable for the following components:   Color, Urine YELLOW (*)    APPearance HAZY (*)    Glucose, UA 150 (*)    Hgb urine dipstick SMALL (*)    Protein, ur 100 (*)    Bacteria, UA RARE (*)    All other components within normal limits  ACETAMINOPHEN LEVEL -  Abnormal; Notable for the following components:   Acetaminophen (Tylenol), Serum <10 (*)    All other components within normal limits  LACTATE DEHYDROGENASE - Abnormal; Notable for the following components:   LDH 450 (*)    All other components within normal limits  SARS CORONAVIRUS 2 BY RT PCR  CULTURE, BLOOD (ROUTINE X 2)  CULTURE, BLOOD (ROUTINE X 2)  GASTROINTESTINAL PANEL BY PCR, STOOL (REPLACES STOOL CULTURE)  C DIFFICILE QUICK SCREEN W PCR REFLEX    CK  HIV ANTIBODY (ROUTINE TESTING W REFLEX)  HEPATITIS PANEL, ACUTE  CBC  KAPPA/LAMBDA LIGHT CHAINS  PROTEIN ELECTROPHORESIS, SERUM  HEPATITIS B CORE ANTIBODY, IGM  HEPATITIS B CORE ANTIBODY, TOTAL  HEPATITIS B SURFACE ANTIGEN  CREATININE, URINE, RANDOM  PROTEIN / CREATININE RATIO, URINE  SODIUM, URINE, RANDOM  CBG MONITORING, ED      RADIOLOGY CT abdomen/pelvis: Descending colon thickening and pericolonic inflammatory changes consistent with nonspecific colitis   I also independently reviewed and agree with radiologist interpretations.   PROCEDURES:  Critical Care performed: Yes, see critical care procedure  note(s)  .Critical Care  Performed by: Duffy Bruce, MD Authorized by: Duffy Bruce, MD   Critical care provider statement:    Critical care time (minutes):  30   Critical care time was exclusive of:  Separately billable procedures and treating other patients   Critical care was necessary to treat or prevent imminent or life-threatening deterioration of the following conditions:  Cardiac failure, circulatory failure, respiratory failure and metabolic crisis   Critical care was time spent personally by me on the following activities:  Development of treatment plan with patient or surrogate, discussions with consultants, evaluation of patient's response to treatment, examination of patient, ordering and review of laboratory studies, ordering and review of radiographic studies, ordering and performing treatments and interventions, pulse oximetry, re-evaluation of patient's condition and review of old [place] ED: Medications  sodium bicarbonate 150 mEq in sterile water 1,150 mL infusion ( Intravenous New Bag/Given 09/04/22 1559)  heparin injection 5,000 Units (5,000 Units Subcutaneous Given 09/04/22 1822)  ondansetron (ZOFRAN) tablet 4 mg (has no administration in time range)    Or  ondansetron (ZOFRAN) injection 4 mg (has no administration in time range)  cefTRIAXone (ROCEPHIN) 2 g in sodium chloride 0.9 % 100 mL IVPB (has no administration in time range)  insulin aspart (novoLOG) injection 0-6 Units (has no administration in time range)  sodium chloride 0.9 % bolus 1,000 mL (0 mLs Intravenous Stopped 09/04/22 1534)  ondansetron (ZOFRAN) injection 4 mg (4 mg Intravenous Given 09/04/22 1338)  cefTRIAXone (ROCEPHIN) 2 g in sodium chloride 0.9 % 100 mL IVPB (0 g Intravenous Stopped 09/04/22 1627)  metroNIDAZOLE (FLAGYL) IVPB 500 mg (0 mg Intravenous Stopped 09/04/22 1708)  sodium chloride 0.9 % bolus 1,000 mL (0 mLs Intravenous Stopped 09/04/22 1821)     IMPRESSION / MDM /  [place] / ED COURSE  I reviewed the triage vital signs and the nursing notes.                              Differential diagnosis includes, but is not limited to, viral GI illness, colitis, diverticulitis, dehydration, enteritis, UTI, AKI, anemia  Patient's presentation is most consistent with acute presentation with potential threat to life or bodily function.  The patient is on the cardiac monitor to evaluate for evidence of arrhythmia and/or significant heart rate changes  62 yo F here with generalized abdominal pain and weakness.Labs remarkable for profound acute renal failure with BUN 129 and Cr 17.30. Last creatinine was 0.95 per review of records. She says she does continue to make urine though UOP has been low. AST/ALT elevated but no RUQ pain. WBC normal. CT w/o obtained, reviewed, and shows colitis but o/w no obstruction or other abnormality.  Suspect acute, severe renal failure in setting of dehydration from possible colitis/viral GI illness. Pt started on IVF, IV ABX for colitis, and wiill start on isotonic bicarb per discussion with Dr. Abigail Butts of Nephrology. Admit to medicine.   FINAL CLINICAL IMPRESSION(S) / ED DIAGNOSES   Final diagnoses:  None     Rx / DC Orders   ED Discharge Orders     None        Note:  This document was prepared using Dragon voice recognition software and may include unintentional dictation errors.   Duffy Bruce, MD 09/04/22 2129

## 2022-09-04 NOTE — ED Notes (Signed)
NT obtaining blood cultures now. Will hang antibiotics once cultures collected.

## 2022-09-04 NOTE — ED Notes (Signed)
NT was told to collect 2 sets of blood cultures. NT stated she sent "cultures". However, lab states they only received one set of blood cultures.

## 2022-09-04 NOTE — ED Notes (Signed)
EKG to EDP Paduchowski in person.

## 2022-09-04 NOTE — Assessment & Plan Note (Signed)
SSI  A1C  

## 2022-09-04 NOTE — Assessment & Plan Note (Signed)
BP stable  Titrate home regimen  Hold nephrotoxic agents

## 2022-09-04 NOTE — H&P (Addendum)
History and Physical    Patient: Barbara Holmes [id number] DOB: May 26, 1961 DOA: 09/04/2022 DOS: the patient was seen and examined on 09/04/2022 PCP: [name], MD  Patient coming from: Home  Chief Complaint:  Chief Complaint  Patient presents with   Emesis   Abdominal Pain   HPI: Barbara Holmes is a 62 y.o. female with medical history significant of type 2 diabetes, hyperlipidemia, hypertension presenting with severe AKI, colitis, transaminitis.  Patient reports generalized malaise, recurrent nausea vomiting diarrhea over the past week or so.  Initially was evaluated for dizziness with a course of meclizine that did not improve her symptoms.  No fevers or chills.  Worsening fatigue.  Positive decreased p.o. intake.  Noted to be on lisinopril.  Diarrhea not reported to be bloody or bilious.  No recent travel outside of the country.  No chest pain or shortness of breath.  Has been taking home medications despite symptoms.  No reported NSAID use.  Patient still making urine though has significantly decreased.  No reports of change in diet.  Abdominal pain is predominately generalized.  Symptoms have persisted over the past week with minimal improvement.  Baseline type 2 diabetes and blood pressure that have been overall well-controlled per patient. Presented to the ER afebrile, hemodynamically stable satting well on room air.  White count 9.3, hemoglobin 11.4, platelets 227, urinalysis pending, lipase 104, creatinine 17.3, BUN 129, AST 233, ALT 240, COVID-negative.  CT of the abdomen with descending colon thickening concerning for nonspecific colitis.  Started on Rocephin and Flagyl in the ER for empiric coverage.  Dr. Juleen China with nephrology consulted.  Recommend starting patient on hypotonic fluids. Review of Systems: As mentioned in the history of present illness. All other systems reviewed and are negative. Past Medical History:  Diagnosis Date   Diabetes mellitus without  complication ([place])    Hyperlipidemia    Hypertension    Restless leg syndrome    Past Surgical History:  Procedure Laterality Date   ABDOMINAL HYSTERECTOMY     total   BREAST EXCISIONAL BIOPSY Right 1999   benign   COLONOSCOPY N/A 11/26/2020   Procedure: COLONOSCOPY;  Surgeon: Jonathon Bellows, MD;  Location: [facility] ENDOSCOPY;  Service: Gastroenterology;  Laterality: N/A;   FOOT SURGERY     LAPAROSCOPY     endometriosis   Social History:  reports that she quit smoking about 34 years ago. Her smoking use included cigarettes. She has never used smokeless tobacco. She reports that she does not drink alcohol and does not use drugs.  No Known Allergies  Family History  Problem Relation Age of Onset   Hypertension Mother    Hyperlipidemia Mother    Diabetes Mother    Dementia Mother    Hypertension Father    Hyperlipidemia Father    Hypertension Sister    Hyperlipidemia Sister    Diabetes Sister    Diabetes Maternal Grandmother    Breast cancer Maternal Aunt 58    Prior to Admission medications   Medication Sig Start Date End Date Taking? Authorizing Provider  albuterol (VENTOLIN HFA) 108 (90 Base) MCG/ACT inhaler Ventolin HFA 90 mcg/actuation aerosol inhaler  INHALE 2 PUFFS BY MOUTH EVERY 4 HOURS AS NEEDED FOR COUGH OR WHEEZING    [provider]  benzonatate (TESSALON) 100 MG capsule Take 1 capsule (100 mg total) by mouth 2 (two) times daily as needed for cough. 05/31/22   Simmons-Robinson, Makiera, MD  celecoxib (CELEBREX) 200 MG capsule TAKE 1 CAPSULE (200 MG  TOTAL) BY MOUTH AT BEDTIME AS NEEDED. 11/21/21   Eulas Post, MD  Cholecalciferol 1000 units capsule Take 1,000 Units by mouth daily.     [provider]  FARXIGA 5 MG TABS tablet TAKE 1 TABLET (5 MG TOTAL) BY MOUTH DAILY. 10/10/21   Eulas Post, MD  lansoprazole (PREVACID) 30 MG capsule TAKE 1 CAPSULE (30 MG TOTAL) BY MOUTH DAILY AS NEEDED. 07/20/22   Simmons-Robinson, Riki Sheer, MD  lisinopril  (ZESTRIL) 20 MG tablet TAKE 1 TABLET BY MOUTH EVERY DAY 01/11/22   Eulas Post, MD  meclizine (ANTIVERT) 25 MG tablet meclizine 25 mg tablet  TAKE 1 TABLET BY MOUTH TWICE A DAY AS NEEDED    [provider]  meloxicam (MOBIC) 15 MG tablet  10/01/18   [provider]  metFORMIN (GLUCOPHAGE-XR) 500 MG 24 hr tablet TAKE 2 TABLETS BY MOUTH EVERY DAY WITH BREAKFAST 07/18/21   Eulas Post, MD  rosuvastatin (CRESTOR) 40 MG tablet Take 1 tablet (40 mg total) by mouth daily. 05/03/22   Simmons-Robinson, Makiera, MD  tirzepatide [facility]) 5 MG/0.5ML Pen Inject 5 mg into the skin once a week. 08/14/22   Simmons-Robinson, Riki Sheer, MD  traMADol (ULTRAM) 50 MG tablet Take 1-2 tablets (50-100 mg total) by mouth at bedtime as needed. TAKE 1-2 TABLETS (50-100mg ) BY MOUTH EVERY DAY AT BEDTIME 08/14/22   [name], MD    Physical Exam: Vitals:   09/04/22 1600 09/04/22 1645 09/04/22 1715 09/04/22 1726  BP:      Pulse:  (!) 52 (!) 56 71  Resp:      Temp:      TempSrc:      SpO2: 96% 99% 95% 97%  Weight:      Height:       Physical Exam Constitutional:      General: She is not in acute distress.    Appearance: She is obese.  HENT:     Head: Normocephalic and atraumatic.     Mouth/Throat:     Mouth: Mucous membranes are dry.  Eyes:     Pupils: Pupils are equal, round, and reactive to light.  Cardiovascular:     Rate and Rhythm: Normal rate and regular rhythm.  Pulmonary:     Effort: Pulmonary effort is normal.  Abdominal:     Comments: Obese abdomen, nontender, positive bowel sounds  Musculoskeletal:        General: Normal range of motion.  Skin:    General: Skin is dry.  Neurological:     General: No focal deficit present.  Psychiatric:        Mood and Affect: Mood normal.     Data Reviewed:  There are no new results to review at this time. US RENAL CLINICAL DATA:  Acute renal insufficiency  EXAM: RENAL / URINARY TRACT ULTRASOUND  COMPLETE  COMPARISON:  09/04/2022  FINDINGS: Right Kidney:  Renal measurements: 11.9 x 6.3 x 6.4 cm = volume: 253.6 mL. Echogenicity within normal limits. No mass or hydronephrosis visualized.  Left Kidney:  Renal measurements: 11.1 x 6.5 x 5.8 cm = volume: 220.8 mL. Echogenicity within normal limits. No mass or hydronephrosis visualized.  Bladder:  Appears normal for degree of bladder distention.  Other:  None.  IMPRESSION: 1. Unremarkable renal ultrasound.  Electronically Signed   By: Randa Ngo M.D.   On: 09/04/2022 17:21 CT ABDOMEN PELVIS WO CONTRAST CLINICAL DATA:  Abdominal pain  EXAM: CT ABDOMEN AND PELVIS WITHOUT CONTRAST  TECHNIQUE: Multidetector CT imaging  of the abdomen and pelvis was performed following the standard protocol without IV contrast.  RADIATION DOSE REDUCTION: This exam was performed according to the departmental dose-optimization program which includes automated exposure control, adjustment of the mA and/or kV according to patient size and/or use of iterative reconstruction technique.  COMPARISON:  None Available.  FINDINGS: Lower chest: Linear dependent subsegmental atelectasis or scarring and no pleural or pericardial effusion. Small hiatal hernia.  Hepatobiliary: No focal liver abnormality is seen. No gallstones, gallbladder wall thickening, or biliary dilatation.  Pancreas: Unremarkable. No pancreatic ductal dilatation or surrounding inflammatory changes.  Spleen: Normal in size without focal abnormality.  Adrenals/Urinary Tract: Adrenal glands are unremarkable. Kidneys are normal, without renal calculi, focal lesion, or hydronephrosis. Bladder is unremarkable.  Stomach/Bowel: Stomach is within normal limits. Appendix appears normal. There is thickening of the wall of the descending colon and mild pericolonic fat stranding suggestive of nonspecific colitis. No abscess or obstruction.  Vascular/Lymphatic: Aortic  atherosclerosis. No enlarged abdominal or pelvic lymph nodes.  Reproductive: Status post hysterectomy. No adnexal masses.  Other: No abdominal wall hernia or abnormality. No abdominopelvic ascites.  Musculoskeletal: No acute or significant osseous findings.  IMPRESSION: Descending colon thickening and pericolonic inflammatory changes consistent with nonspecific colitis.  Electronically Signed   By: Sammie Bench M.D.   On: 09/04/2022 14:24  Lab Results  Component Value Date   WBC 9.3 09/04/2022   HGB 11.4 (L) 09/04/2022   HCT 34.2 (L) 09/04/2022   MCV 88.4 09/04/2022   PLT 227 [id number]   Last metabolic panel Lab Results  Component Value Date   GLUCOSE 101 (H) 09/04/2022   NA 142 09/04/2022   K 5.6 (H) 09/04/2022   CL 96 (L) 09/04/2022   CO2 24 09/04/2022   BUN 129 (H) 09/04/2022   CREATININE 17.30 (H) 09/04/2022   GFRNONAA 2 (L) 09/04/2022   CALCIUM 9.1 09/04/2022   PROT 7.6 09/04/2022   ALBUMIN 3.7 09/04/2022   LABGLOB 2.6 10/26/2021   AGRATIO 1.7 10/26/2021   BILITOT 0.6 09/04/2022   ALKPHOS 70 09/04/2022   AST 233 (H) 09/04/2022   ALT 240 (H) 09/04/2022   ANIONGAP 22 (H) 09/04/2022    Assessment and Plan:  AKI (acute kidney injury) Patient with new onset severe renal failure with creatinine of 17.3 with baseline creatinine around 1 Noted concurrent colitis with decreased p.o. intake Also with home ACE inhibitor use BUN/creatinine ratio around 7 (? Intrinsic renal injury/ATN)  Urinalysis is pending-pt still making urine  No significant acute renal/ureteral pathology noted on CT A&P Formal nephrology consult pending Started on hypotonic fluids per nephrology recommendations Add on renal ultrasound Checking  CK level Check FENa As a discussion, HUS a consideration on DDX given active colitis,  however pt afebrile, hgb 11.4, plt 227, and neurologically intact at present  F/u stool studies  Appreciate nephrology input   Transaminitis AST 233, ALT  240 ?  Ischemic injury in the setting of colitis and AKI Will check Tylenol level, acute hepatitis panel CT abdomen and pelvis negative for any hepatobiliary disease Trend with hydration Abd u/s as clinically indicated  Follow   Colitis Positive nausea and abdominal pain x 1 week with noted nonspecific colitis on CT  Will place on empiric Rocephin and azithromycin for infectious coverage C. difficile and stool panel obtained Will otherwise monitor for now  Diabetes mellitus, type 2 SSI A1C  Essential (primary) hypertension BP stable  Titrate home regimen  Hold nephrotoxic agents  Advance Care Planning:   Code Status: Full Code   Consults: Nephrology w/ Dr. Juleen China  Family Communication: Husband at the bedside   Severity of Illness: The appropriate patient status for this patient is INPATIENT. Inpatient status is judged to be reasonable and necessary in order to provide the required intensity of service to ensure the patient's safety. The patient's presenting symptoms, physical exam findings, and initial radiographic and laboratory data in the context of their chronic comorbidities is felt to place them at high risk for further clinical deterioration. Furthermore, it is not anticipated that the patient will be medically stable for discharge from the hospital within 2 midnights of admission.   * I certify that at the point of admission it is my clinical judgment that the patient will require inpatient hospital care spanning beyond 2 midnights from the point of admission due to high intensity of service, high risk for further deterioration and high frequency of surveillance required.*  Author: Deneise Lever, MD 09/04/2022 5:29 PM  For on call review [email or web address].

## 2022-09-04 NOTE — ED Triage Notes (Signed)
Pt to ED via POV from home. Pt reports she started having inner ear problems and dx with vertigo and placed on meclizine. Pt reports now she has been having N/V. Pt reports abdominal pain.

## 2022-09-04 NOTE — ED Notes (Signed)
N/V/D since Wednesday per pt. States hx ear issues and dizziness. Pt laying calmly on stretcher, skin dry and resp reg/unlabored.

## 2022-09-05 DIAGNOSIS — N186 End stage renal disease: Secondary | ICD-10-CM | POA: Diagnosis not present

## 2022-09-05 LAB — GASTROINTESTINAL PANEL BY PCR, STOOL (REPLACES STOOL CULTURE)

## 2022-09-05 LAB — CBC
HCT: 27.4 % — ABNORMAL LOW (ref 36.0–46.0)
Hemoglobin: 9.1 g/dL — ABNORMAL LOW (ref 12.0–15.0)
MCH: 29.4 pg (ref 26.0–34.0)
MCHC: 33.2 g/dL (ref 30.0–36.0)
MCV: 88.4 fL (ref 80.0–100.0)
Platelets: 163 10*3/uL (ref 150–400)
RBC: 3.1 MIL/uL — ABNORMAL LOW (ref 3.87–5.11)
RDW: 12.6 % (ref 11.5–15.5)
WBC: 7.9 10*3/uL (ref 4.0–10.5)
nRBC: 0 % (ref 0.0–0.2)

## 2022-09-05 LAB — C DIFFICILE QUICK SCREEN W PCR REFLEX
C Diff antigen: NEGATIVE
C Diff interpretation: NOT DETECTED
C Diff toxin: NEGATIVE

## 2022-09-05 LAB — HIV ANTIBODY (ROUTINE TESTING W REFLEX): HIV Screen 4th Generation wRfx: NONREACTIVE

## 2022-09-05 LAB — COMPREHENSIVE METABOLIC PANEL
ALT: 196 U/L — ABNORMAL HIGH (ref 0–44)
AST: 172 U/L — ABNORMAL HIGH (ref 15–41)
Albumin: 3.3 g/dL — ABNORMAL LOW (ref 3.5–5.0)
Alkaline Phosphatase: 60 U/L (ref 38–126)
Anion gap: 19 — ABNORMAL HIGH (ref 5–15)
BUN: 112 mg/dL — ABNORMAL HIGH (ref 8–23)
CO2: 31 mmol/L (ref 22–32)
Calcium: 8.1 mg/dL — ABNORMAL LOW (ref 8.9–10.3)
Chloride: 93 mmol/L — ABNORMAL LOW (ref 98–111)
Creatinine, Ser: 13.94 mg/dL — ABNORMAL HIGH (ref 0.44–1.00)
GFR, Estimated: 3 mL/min — ABNORMAL LOW (ref >60)
Glucose, Bld: 97 mg/dL (ref 70–99)
Potassium: 4.4 mmol/L (ref 3.5–5.1)
Sodium: 143 mmol/L (ref 135–145)
Total Bilirubin: 0.7 mg/dL (ref 0.3–1.2)
Total Protein: 6.3 g/dL — ABNORMAL LOW (ref 6.5–8.1)

## 2022-09-05 LAB — HEPATITIS PANEL, ACUTE
HCV Ab: NONREACTIVE
Hep A IgM: NONREACTIVE
Hep B C IgM: NONREACTIVE
Hepatitis B Surface Ag: NONREACTIVE

## 2022-09-05 LAB — GLUCOSE, CAPILLARY
Glucose-Capillary: 72 mg/dL (ref 70–99)
Glucose-Capillary: 86 mg/dL (ref 70–99)
Glucose-Capillary: 97 mg/dL (ref 70–99)

## 2022-09-05 LAB — HEPATITIS B CORE ANTIBODY, TOTAL: Hep B Core Total Ab: NONREACTIVE

## 2022-09-05 LAB — HEPATITIS B SURFACE ANTIGEN: Hepatitis B Surface Ag: NONREACTIVE

## 2022-09-05 LAB — HEPATITIS B CORE ANTIBODY, IGM: Hep B C IgM: NONREACTIVE

## 2022-09-05 MED ORDER — NEPRO/CARBSTEADY PO LIQD
237.0000 mL | Freq: Two times a day (BID) | ORAL | Status: DC
  Administered 2022-09-06 – 2022-09-07 (×2): 237 mL via ORAL

## 2022-09-05 MED ORDER — LACTATED RINGERS IV SOLN: INTRAVENOUS | Status: DC

## 2022-09-05 MED ORDER — ACETAMINOPHEN 325 MG PO TABS: 650.0000 mg | ORAL_TABLET | Freq: Four times a day (QID) | ORAL | Status: DC | PRN

## 2022-09-05 MED ORDER — SODIUM CHLORIDE 0.9 % IV SOLN
12.5000 mg | Freq: Three times a day (TID) | INTRAVENOUS | Status: DC | PRN
  Administered 2022-09-05: 12.5 mg via INTRAVENOUS
  Filled 2022-09-05: qty 12.5
  Filled 2022-09-05: qty 0.5

## 2022-09-05 MED ORDER — BANATROL TF EN LIQD
60.0000 mL | Freq: Two times a day (BID) | ENTERAL | Status: DC
  Administered 2022-09-05 – 2022-09-08 (×7): 60 mL via ORAL
  Filled 2022-09-05 (×8): qty 60

## 2022-09-05 MED ORDER — OXYCODONE HCL 5 MG PO TABS
5.0000 mg | ORAL_TABLET | ORAL | Status: DC | PRN
  Administered 2022-09-05 – 2022-09-07 (×4): 5 mg via ORAL
  Filled 2022-09-05 (×4): qty 1

## 2022-09-05 NOTE — Progress Notes (Signed)
Patient O2 saturation were around 88% RA.  Placed patient on 2 L O2.   Saturation improved to 94%   MD made aware

## 2022-09-05 NOTE — TOC Initial Note (Signed)
Transition of Care [facility]) - Initial/Assessment Note    Patient Details  Name: Barbara Holmes MRN: [id number] Date of Birth: 22-Jan-1961  Transition of [facility]) CM/SW Contact:    Laurena Slimmer, RN Phone Number: 09/05/2022, 4:31 PM  Clinical Narrative:                  Transition of Care [facility]) Screening Note   Patient Details  Name: Barbara Holmes Date of Birth: 03/02/1961   Transition of Care [facility]) CM/SW Contact:    Laurena Slimmer, RN Phone Number: 09/05/2022, 4:31 PM    Transition of Care Department [facility]) has reviewed patient and no TOC needs have been identified at this time. We will continue to monitor patient advancement through interdisciplinary progression rounds. If new patient transition needs arise, please place a TOC consult.          Patient Goals and CMS Choice            Expected Discharge Plan and Services                                              Prior Living Arrangements/Services                       Activities of Daily Living Home Assistive Devices/Equipment: Dentures (specify type), Eyeglasses ADL Screening (condition at time of admission) Patient's cognitive ability adequate to safely complete daily activities?: Yes Is the patient deaf or have difficulty hearing?: No Does the patient have difficulty seeing, even when wearing glasses/contacts?: No Does the patient have difficulty concentrating, remembering, or making decisions?: No Patient able to express need for assistance with ADLs?: Yes Does the patient have difficulty dressing or bathing?: No Independently performs ADLs?: Yes (appropriate for developmental age) Does the patient have difficulty walking or climbing stairs?: No Weakness of Legs: None Weakness of Arms/Hands: None  Permission Sought/Granted                  Emotional Assessment              Admission diagnosis:  End stage renal disease [N18.6] Acute renal failure, unspecified  acute renal failure type [N17.9] Patient Active Problem List   Diagnosis Date Noted   AKI (acute kidney injury) 09/04/2022   Colitis 09/04/2022   Transaminitis 09/04/2022   Diabetes mellitus, type 2 09/01/2015   Arthritis 07/12/2015   Lesion of ulnar nerve 07/12/2015   Restless leg 09/21/2009   Allergic rhinitis 08/07/2009   Avitaminosis D 04/05/2009   Essential (primary) hypertension 11/04/2007   HLD (hyperlipidemia) 11/04/2007   Esophagitis, reflux 04/15/2007   PCP:  [name], MD Pharmacy:   [id number] [address], [place] - [place] [address] Phone: [phone] Fax: [phone]     Social Determinants of Health (SDOH) Social History: SDOH Screenings   Food Insecurity: No Food Insecurity (09/04/2022)  Housing: Low Risk  (09/04/2022)  Transportation Needs: No Transportation Needs (09/04/2022)  Utilities: Not At Risk (09/04/2022)  Alcohol Screen: Low Risk  (10/26/2021)  Depression (PHQ2-9): Low Risk  (08/14/2022)  Tobacco Use: Medium Risk (09/04/2022)   SDOH Interventions:     Readmission Risk Interventions     No data to display

## 2022-09-05 NOTE — Progress Notes (Signed)
[place] at [place] NAME: Barbara Holmes    MR#:  [id number]  DATE OF BIRTH:  1961/02/15  SUBJECTIVE:   No family at bedside. Patient came in with recurrent nausea vomiting and diarrhea for a week. Continues to still have some diarrhea. Has been tolerating some PO. Severely dehydrated with creatinine of 17.0. Does not have any underlying kidney disorder.  VITALS:  Blood pressure (!) 121/51, pulse (!) 53, temperature 98.1 F (36.7 C), temperature source Oral, resp. rate 17, height 4\' 11"  (1.499 m), weight 74.6 kg, SpO2 90 %.  PHYSICAL EXAMINATION:   GENERAL:  62 y.o.-year-old patient with no acute distress. weak LUNGS: Normal breath sounds bilaterally, no wheezing CARDIOVASCULAR: S1, S2 normal. No murmur   ABDOMEN: Soft, nontender, nondistended. Bowel sounds present.  EXTREMITIES: No  edema b/l.    NEUROLOGIC: nonfocal  patient is alert and awake SKIN: No obvious rash, lesion, or ulcer.   LABORATORY PANEL:  CBC Recent Labs  Lab 09/05/22 0440  WBC 7.9  HGB 9.1*  HCT 27.4*  PLT 163    Chemistries  Recent Labs  Lab 09/05/22 1013  NA 143  K 4.4  CL 93*  CO2 31  GLUCOSE 97  BUN 112*  CREATININE 13.94*  CALCIUM 8.1*  AST 172*  ALT 196*  ALKPHOS 60  BILITOT 0.7    RADIOLOGY:  DG Chest Port 1 View  Result Date: 09/04/2022 CLINICAL DATA:  Nausea and vomiting, vertigo EXAM: PORTABLE CHEST 1 VIEW COMPARISON:  05/17/2012 FINDINGS: Single frontal view of the chest demonstrates mild enlargement of the cardiac silhouette, which may be accentuated by portable AP technique. No acute airspace disease, effusion, or pneumothorax. No acute bony abnormalities. IMPRESSION: 1. Mild enlargement the cardiac silhouette, likely projectional. 2. No acute airspace disease. Electronically Signed   By: Randa Ngo M.D.   On: 09/04/2022 17:46   US RENAL  Result Date: 09/04/2022 CLINICAL DATA:  Acute renal insufficiency EXAM: RENAL / URINARY TRACT  ULTRASOUND COMPLETE COMPARISON:  09/04/2022 FINDINGS: Right Kidney: Renal measurements: 11.9 x 6.3 x 6.4 cm = volume: 253.6 mL. Echogenicity within normal limits. No mass or hydronephrosis visualized. Left Kidney: Renal measurements: 11.1 x 6.5 x 5.8 cm = volume: 220.8 mL. Echogenicity within normal limits. No mass or hydronephrosis visualized. Bladder: Appears normal for degree of bladder distention. Other: None. IMPRESSION: 1. Unremarkable renal ultrasound. Electronically Signed   By: Randa Ngo M.D.   On: 09/04/2022 17:21   CT ABDOMEN PELVIS WO CONTRAST  Result Date: 09/04/2022 CLINICAL DATA:  Abdominal pain EXAM: CT ABDOMEN AND PELVIS WITHOUT CONTRAST TECHNIQUE: Multidetector CT imaging of the abdomen and pelvis was performed following the standard protocol without IV contrast. RADIATION DOSE REDUCTION: This exam was performed according to the departmental dose-optimization program which includes automated exposure control, adjustment of the mA and/or kV according to patient size and/or use of iterative reconstruction technique. COMPARISON:  None Available. FINDINGS: Lower chest: Linear dependent subsegmental atelectasis or scarring and no pleural or pericardial effusion. Small hiatal hernia. Hepatobiliary: No focal liver abnormality is seen. No gallstones, gallbladder wall thickening, or biliary dilatation. Pancreas: Unremarkable. No pancreatic ductal dilatation or surrounding inflammatory changes. Spleen: Normal in size without focal abnormality. Adrenals/Urinary Tract: Adrenal glands are unremarkable. Kidneys are normal, without renal calculi, focal lesion, or hydronephrosis. Bladder is unremarkable. Stomach/Bowel: Stomach is within normal limits. Appendix appears normal. There is thickening of the wall of the descending colon and mild pericolonic fat stranding suggestive of  nonspecific colitis. No abscess or obstruction. Vascular/Lymphatic: Aortic atherosclerosis. No enlarged abdominal or pelvic lymph  nodes. Reproductive: Status post hysterectomy. No adnexal masses. Other: No abdominal wall hernia or abnormality. No abdominopelvic ascites. Musculoskeletal: No acute or significant osseous findings. IMPRESSION: Descending colon thickening and pericolonic inflammatory changes consistent with nonspecific colitis. Electronically Signed   By: Sammie Bench M.D.   On: 09/04/2022 14:24    Assessment and Plan  Dessence Osler is a 62 y.o. female with medical history significant of type 2 diabetes, hyperlipidemia, hypertension presenting with severe AKI, colitis, transaminitis.  Patient reports generalized malaise, recurrent nausea vomiting diarrhea over the past week or so.   CT of the abdomen with descending colon thickening concerning for nonspecific colitis.  Came in with creat of 17.0 (baseline creat 0.96)  AKI (acute kidney injury) appears due to severe dehydration from G.I. losses. --Patient with new onset severe renal failure with creatinine of 17.3--13.0 --baseline creatinine around 1 --Noted concurrent colitis with decreased p.o. intake --Also with home ACE inhibitor use --No significant acute renal/ureteral pathology noted on CT A&P -- nephrology consultation with Dr. Juleen China. Continue IV hydration. Creatinine trending down. -- Renal ultrasound negative for obstruction  Transaminitis AST 233, ALT 240 ?  Ischemic injury in the setting of colitis and AKI -- acute hepatitis panel-- negative --CT abdomen and pelvis negative for any hepatobiliary disease -- levels trending down -- consider ultrasound abdomen if patient is symptomatic currently no symptoms   Acute colitis --Positive nausea and abdominal pain x 1 week with noted nonspecific colitis on CT  --Will place on empiric Rocephin and azithromycin for infectious coverage --C. difficile negative   Diabetes mellitus, type 2, hyperglycemia SSI for now till po intake improves   Essential (primary) hypertension --BP stable   ---Titrate home regimen  -Hold nephrotoxic agents     Family communication :none today Consults : nephrology CODE STATUS: full DVT Prophylaxis : heparin Level of care: Progressive Status is: Inpatient Remains inpatient appropriate because: acute renal failure receiving IV fluids monitor creatinine    TOTAL TIME TAKING CARE OF THIS PATIENT: 35 minutes.  >50% time spent on counselling and coordination of care  Note: This dictation was prepared with Dragon dictation along with smaller phrase technology. Any transcriptional errors that result from this process are unintentional.  Fritzi Mandes M.D    [facility]   CC: Primary care physician; [name], [place] [facility] [place] NAME: Barbara Holmes    MR#:  [id number]  DATE OF BIRTH:  21-Sep-1960  SUBJECTIVE:      VITALS:  Blood pressure (!) 121/51, pulse (!) 53, temperature 98.1 F (36.7 C), temperature source Oral, resp. rate 17, height 4\' 11"  (1.499 m), weight 74.6 kg, SpO2 90 %.  PHYSICAL EXAMINATION:   GENERAL:  62 y.o.-year-old patient with no acute distress.  LUNGS: Normal breath sounds bilaterally, no wheezing CARDIOVASCULAR: S1, S2 normal. No murmur   ABDOMEN: Soft, nontender, nondistended. Bowel sounds present.  EXTREMITIES: No  edema b/l.    NEUROLOGIC: nonfocal  patient is alert and awake SKIN: No obvious rash, lesion, or ulcer.   LABORATORY PANEL:  CBC Recent Labs  Lab 09/05/22 0440  WBC 7.9  HGB 9.1*  HCT 27.4*  PLT 163    Chemistries  Recent Labs  Lab 09/05/22 1013  NA 143  K 4.4  CL 93*  CO2 31  GLUCOSE 97  BUN 112*  CREATININE 13.94*  CALCIUM 8.1*  AST  172*  ALT 196*  ALKPHOS 60  BILITOT 0.7   Cardiac Enzymes No results for input(s): "TROPONINI" in the last 168 hours. RADIOLOGY:  DG Chest Port 1 View  Result Date: 09/04/2022 CLINICAL DATA:  Nausea and vomiting, vertigo EXAM: PORTABLE CHEST 1 VIEW COMPARISON:   05/17/2012 FINDINGS: Single frontal view of the chest demonstrates mild enlargement of the cardiac silhouette, which may be accentuated by portable AP technique. No acute airspace disease, effusion, or pneumothorax. No acute bony abnormalities. IMPRESSION: 1. Mild enlargement the cardiac silhouette, likely projectional. 2. No acute airspace disease. Electronically Signed   By: Randa Ngo M.D.   On: 09/04/2022 17:46   US RENAL  Result Date: 09/04/2022 CLINICAL DATA:  Acute renal insufficiency EXAM: RENAL / URINARY TRACT ULTRASOUND COMPLETE COMPARISON:  09/04/2022 FINDINGS: Right Kidney: Renal measurements: 11.9 x 6.3 x 6.4 cm = volume: 253.6 mL. Echogenicity within normal limits. No mass or hydronephrosis visualized. Left Kidney: Renal measurements: 11.1 x 6.5 x 5.8 cm = volume: 220.8 mL. Echogenicity within normal limits. No mass or hydronephrosis visualized. Bladder: Appears normal for degree of bladder distention. Other: None. IMPRESSION: 1. Unremarkable renal ultrasound. Electronically Signed   By: Randa Ngo M.D.   On: 09/04/2022 17:21   CT ABDOMEN PELVIS WO CONTRAST  Result Date: 09/04/2022 CLINICAL DATA:  Abdominal pain EXAM: CT ABDOMEN AND PELVIS WITHOUT CONTRAST TECHNIQUE: Multidetector CT imaging of the abdomen and pelvis was performed following the standard protocol without IV contrast. RADIATION DOSE REDUCTION: This exam was performed according to the departmental dose-optimization program which includes automated exposure control, adjustment of the mA and/or kV according to patient size and/or use of iterative reconstruction technique. COMPARISON:  None Available. FINDINGS: Lower chest: Linear dependent subsegmental atelectasis or scarring and no pleural or pericardial effusion. Small hiatal hernia. Hepatobiliary: No focal liver abnormality is seen. No gallstones, gallbladder wall thickening, or biliary dilatation. Pancreas: Unremarkable. No pancreatic ductal dilatation or surrounding  inflammatory changes. Spleen: Normal in size without focal abnormality. Adrenals/Urinary Tract: Adrenal glands are unremarkable. Kidneys are normal, without renal calculi, focal lesion, or hydronephrosis. Bladder is unremarkable. Stomach/Bowel: Stomach is within normal limits. Appendix appears normal. There is thickening of the wall of the descending colon and mild pericolonic fat stranding suggestive of nonspecific colitis. No abscess or obstruction. Vascular/Lymphatic: Aortic atherosclerosis. No enlarged abdominal or pelvic lymph nodes. Reproductive: Status post hysterectomy. No adnexal masses. Other: No abdominal wall hernia or abnormality. No abdominopelvic ascites. Musculoskeletal: No acute or significant osseous findings. IMPRESSION: Descending colon thickening and pericolonic inflammatory changes consistent with nonspecific colitis. Electronically Signed   By: Sammie Bench M.D.   On: 09/04/2022 14:24    Assessment and Plan      Procedures: Family communication : Consults : CODE STATUS:  DVT Prophylaxis : Level of care: Progressive Status is: Inpatient Remains inpatient appropriate because: acute renal failure, IV fluids, monitoring creatinine    TOTAL TIME TAKING CARE OF THIS PATIENT: 35 minutes.  >50% time spent on counselling and coordination of care  Note: This dictation was prepared with Dragon dictation along with smaller phrase technology. Any transcriptional errors that result from this process are unintentional.  Fritzi Mandes M.D    [facility]   CC: Primary care physician; [name], [place]

## 2022-09-05 NOTE — Consult Note (Signed)
[facility]  CONSULT NOTE    Date: 09/05/2022                  Patient Name:  Barbara Holmes  MRN: [id number]  DOB: [birth year]  Age / Sex: 62 y.o., female         PCP: [name], MD                 Service Requesting Consult: [place]                 Reason for Consult: Acute kidney injury            History of Present Illness: Ms. Rosabell Camm is a 62 y.o.  female with past medical conditions including hyperlipidemia, type 2 diabetes, hypertension, who was admitted to [facility] on 09/04/2022 for End stage renal disease [N18.6] Acute renal failure, unspecified acute renal failure type [N17.9]  Patient presents to the emergency department complaining of abdominal pain and vomiting.  Patient seen sitting up in chair, family at bedside.  Patient states she has been experiencing nausea and vomiting for a week.  She states that this started with dizziness for which she has meclizine prescribed by an outpatient physician.  Symptoms progressed to nausea and vomiting.  No known fever or chills.  Decreased oral intake due to symptoms.  Progressive fatigue.  No NSAID use.  Labs on ED arrival include potassium 5.6, glucose 101, BUN 129, creatinine 17.3 with GFR 2, and hemoglobin 11.4.  Currently negative for COVID-19.  UA appears hazy with mild proteinuria and some bacteria.  CT abdomen pelvis inconsistent colitis.  Renal ultrasound negative for obstruction.  Chest x-ray negative for acute findings.  C. difficile negative.  Medications: Outpatient medications: Medications Prior to Admission  Medication Sig Dispense Refill Last Dose   meclizine (ANTIVERT) 25 MG tablet meclizine 25 mg tablet  TAKE 1 TABLET BY MOUTH TWICE A DAY AS NEEDED   Past Week at PRN   ondansetron (ZOFRAN) 4 MG tablet Take 4 mg by mouth every 8 (eight) hours as needed for vomiting or nausea.   09/03/2022   traMADol (ULTRAM) 50 MG tablet Take 1-2 tablets (50-100 mg total) by mouth at bedtime as  needed. TAKE 1-2 TABLETS (50-100mg ) BY MOUTH EVERY DAY AT BEDTIME 60 tablet 0 09/03/2022   albuterol (VENTOLIN HFA) 108 (90 Base) MCG/ACT inhaler Ventolin HFA 90 mcg/actuation aerosol inhaler  INHALE 2 PUFFS BY MOUTH EVERY 4 HOURS AS NEEDED FOR COUGH OR WHEEZING   PRN at PRN   benzonatate (TESSALON) 100 MG capsule Take 1 capsule (100 mg total) by mouth 2 (two) times daily as needed for cough. (Patient not taking: Reported on 09/04/2022) 20 capsule 0 Not Taking   celecoxib (CELEBREX) 200 MG capsule TAKE 1 CAPSULE (200 MG TOTAL) BY MOUTH AT BEDTIME AS NEEDED. 30 capsule 5 PRN at PRN   Cholecalciferol 1000 units capsule Take 1,000 Units by mouth daily.    09/02/2022   FARXIGA 5 MG TABS tablet TAKE 1 TABLET (5 MG TOTAL) BY MOUTH DAILY. 30 tablet 11 09/02/2022   lansoprazole (PREVACID) 30 MG capsule TAKE 1 CAPSULE (30 MG TOTAL) BY MOUTH DAILY AS NEEDED. 90 capsule 1 09/02/2022   lisinopril (ZESTRIL) 20 MG tablet TAKE 1 TABLET BY MOUTH EVERY DAY 90 tablet 3 09/02/2022   meloxicam (MOBIC) 15 MG tablet  (Patient not taking: Reported on 09/04/2022)   Not Taking   metFORMIN (GLUCOPHAGE-XR) 500 MG 24 hr tablet TAKE 2  TABLETS BY MOUTH EVERY DAY WITH BREAKFAST 60 tablet 5 09/02/2022   rosuvastatin (CRESTOR) 40 MG tablet Take 1 tablet (40 mg total) by mouth daily. 90 tablet 1 09/02/2022   tirzepatide (MOUNJARO) 5 MG/0.5ML Pen Inject 5 mg into the skin once a week. 6 mL 0 08/23/2022    Current medications: Current Facility-Administered Medications  Medication Dose Route Frequency Provider Last Rate Last Admin   acetaminophen (TYLENOL) tablet 650 mg  650 mg Oral Q6H PRN Fritzi Mandes, MD       cefTRIAXone (ROCEPHIN) 2 g in sodium chloride 0.9 % 100 mL IVPB  2 g Intravenous Q24H Deneise Lever, MD 200 mL/hr at 09/05/22 1341 2 g at 09/05/22 1341   feeding supplement (NEPRO CARB STEADY) liquid 237 mL  237 mL Oral BID BM Fritzi Mandes, MD       fiber supplement (BANATROL TF) liquid 60 mL  60 mL Oral BID Fritzi Mandes, MD        heparin injection 5,000 Units  5,000 Units Subcutaneous Q8H Deneise Lever, MD   5,000 Units at 09/05/22 1320   insulin aspart (novoLOG) injection 0-6 Units  0-6 Units Subcutaneous TID WC Deneise Lever, MD       ondansetron [facility]) tablet 4 mg  4 mg Oral Q6H PRN Deneise Lever, MD       Or   ondansetron [facility]) injection 4 mg  4 mg Intravenous Q6H PRN Deneise Lever, MD   4 mg at 09/05/22 1320   oxyCODONE (Oxy IR/ROXICODONE) immediate release tablet 5 mg  5 mg Oral Q4H PRN Fritzi Mandes, MD   5 mg at 09/05/22 1320   sodium bicarbonate 150 mEq in sterile water 1,150 mL infusion   Intravenous Continuous Duffy Bruce, MD 125 mL/hr at 09/05/22 0328 New Bag at 09/05/22 0328      Allergies: No Known Allergies    Past Medical History: Past Medical History:  Diagnosis Date   Diabetes mellitus without complication    Hyperlipidemia    Hypertension    Restless leg syndrome      Past Surgical History: Past Surgical History:  Procedure Laterality Date   ABDOMINAL HYSTERECTOMY     total   BREAST EXCISIONAL BIOPSY Right 1999   benign   COLONOSCOPY N/A 11/26/2020   Procedure: COLONOSCOPY;  Surgeon: Jonathon Bellows, MD;  Location: [facility] ENDOSCOPY;  Service: Gastroenterology;  Laterality: N/A;   FOOT SURGERY     LAPAROSCOPY     endometriosis     Family History: Family History  Problem Relation Age of Onset   Hypertension Mother    Hyperlipidemia Mother    Diabetes Mother    Dementia Mother    Hypertension Father    Hyperlipidemia Father    Hypertension Sister    Hyperlipidemia Sister    Diabetes Sister    Diabetes Maternal Grandmother    Breast cancer Maternal Aunt 58     Social History: Social History   Socioeconomic History   Marital status: Divorced    Spouse name: Not on file   Number of children: Not on file   Years of education: Not on file   Highest education level: Not on file  Occupational History   Not on file  Tobacco Use   Smoking status: Former     Years: 3    Types: Cigarettes    Quit date: 06/05/1988    Years since quitting: 34.2   Smokeless tobacco: Never   Tobacco comments:  1 cigarette per week  Vaping Use   Vaping Use: Never used  Substance and Sexual Activity   Alcohol use: No    Alcohol/week: 0.0 standard drinks of alcohol   Drug use: No   Sexual activity: Not on file  Other Topics Concern   Not on file  Social History Narrative   Not on file   Social Determinants of Health   Financial Resource Strain: Not on file  Food Insecurity: No Food Insecurity (09/04/2022)   Hunger Vital Sign    Worried About Running Out of Food in the Last Year: Never true    Ran Out of Food in the Last Year: Never true  Transportation Needs: No Transportation Needs (09/04/2022)   PRAPARE - Hydrologist (Medical): No    Lack of Transportation (Non-Medical): No  Physical Activity: Not on file  Stress: Not on file  Social Connections: Not on file  Intimate Partner Violence: Not At Risk (09/04/2022)   Humiliation, Afraid, Rape, and Kick questionnaire    Fear of Current or Ex-Partner: No    Emotionally Abused: No    Physically Abused: No    Sexually Abused: No     Review of Systems: Review of Systems  Constitutional:  Negative for chills, fever and malaise/fatigue.  HENT:  Negative for congestion, sore throat and tinnitus.   Eyes:  Negative for blurred vision and redness.  Respiratory:  Negative for cough, shortness of breath and wheezing.   Cardiovascular:  Negative for chest pain, palpitations, claudication and leg swelling.  Gastrointestinal:  Negative for abdominal pain, blood in stool, diarrhea, nausea and vomiting.  Genitourinary:  Negative for flank pain, frequency and hematuria.  Musculoskeletal:  Negative for back pain, falls and myalgias.  Skin:  Negative for rash.  Neurological:  Negative for dizziness, weakness and headaches.  Endo/Heme/Allergies:  Does not bruise/bleed easily.   Psychiatric/Behavioral:  Negative for depression. The patient is not nervous/anxious and does not have insomnia.     Vital Signs: Blood pressure (!) 121/51, pulse (!) 53, temperature 98.1 F (36.7 C), temperature source Oral, resp. rate 17, height 4\' 11"  (1.499 m), weight 74.6 kg, SpO2 90 %.  Weight trends: Filed Weights   09/04/22 1153 09/04/22 2215  Weight: 72.1 kg 74.6 kg    Physical Exam: General: NAD  Head: Normocephalic, atraumatic. Moist oral mucosal membranes  Eyes: Anicteric  Lungs:  Clear to auscultation, normal effort  Heart: Regular rate and rhythm  Abdomen:  Soft, nontender,   Extremities: No peripheral edema.  Neurologic: Nonfocal, moving all four extremities  Skin: No lesions  Access: None     Lab results: Basic Metabolic Panel: Recent Labs  Lab 09/04/22 1157 09/05/22 1013  NA 142 143  K 5.6* 4.4  CL 96* 93*  CO2 24 31  GLUCOSE 101* 97  BUN 129* 112*  CREATININE 17.30* 13.94*  CALCIUM 9.1 8.1*    Liver Function Tests: Recent Labs  Lab 09/04/22 1157 09/05/22 1013  AST 233* 172*  ALT 240* 196*  ALKPHOS 70 60  BILITOT 0.6 0.7  PROT 7.6 6.3*  ALBUMIN 3.7 3.3*   Recent Labs  Lab 09/04/22 1157  LIPASE 104*   No results for input(s): "AMMONIA" in the last 168 hours.  CBC: Recent Labs  Lab 09/04/22 1157 09/05/22 0440  WBC 9.3 7.9  HGB 11.4* 9.1*  HCT 34.2* 27.4*  MCV 88.4 88.4  PLT 227 163    Cardiac Enzymes: Recent Labs  Lab 09/04/22 1807  CKTOTAL 50    BNP: Invalid input(s): "POCBNP"  CBG: Recent Labs  Lab 09/04/22 2108 09/05/22 0849 09/05/22 1301  GLUCAP 93 72 63    Microbiology: Results for orders placed or performed during the hospital encounter of 09/04/22  SARS Coronavirus 2 by RT PCR (hospital order, performed in [facility] hospital lab) *cepheid single result test* Anterior Nasal Swab     Status: None   Collection Time: 09/04/22 11:57 AM   Specimen: Anterior Nasal Swab  Result Value Ref Range Status    SARS Coronavirus 2 by RT PCR NEGATIVE NEGATIVE Final    Comment: (NOTE) SARS-CoV-2 target nucleic acids are NOT DETECTED.  The SARS-CoV-2 RNA is generally detectable in upper and lower respiratory specimens during the acute phase of infection. The lowest concentration of SARS-CoV-2 viral copies this assay can detect is 250 copies / mL. A negative result does not preclude SARS-CoV-2 infection and should not be used as the sole basis for treatment or other patient management decisions.  A negative result may occur with improper specimen collection / handling, submission of specimen other than nasopharyngeal swab, presence of viral mutation(s) within the areas targeted by this assay, and inadequate number of viral copies (<250 copies / mL). A negative result must be combined with clinical observations, patient history, and epidemiological information.  Fact Sheet for Patients:   https://www.patel.info/  Fact Sheet for Healthcare Providers: https://hall.com/  This test is not yet approved or  cleared by the [place] FDA and has been authorized for detection and/or diagnosis of SARS-CoV-2 by FDA under an Emergency Use Authorization (EUA).  This EUA will remain in effect (meaning this test can be used) for the duration of the COVID-19 declaration under Section 564(b)(1) of the Act, 21 U.S.C. section 360bbb-3(b)(1), unless the authorization is terminated or revoked sooner.  Performed at [facility], [address] [place], [place] [address]   Blood culture (routine x 2)     Status: None (Preliminary result)   Collection Time: 09/04/22  3:07 PM   Specimen: BLOOD  Result Value Ref Range Status   Specimen Description BLOOD BLOOD LEFT ARM  Final   Special Requests   Final    BOTTLES DRAWN AEROBIC AND ANAEROBIC Blood Culture results may not be optimal due to an excessive volume of blood received in culture bottles   Culture    Final    NO GROWTH < 24 HOURS Performed at [facility], [address], [place], [place] [address]    Report Status PENDING  Incomplete  Blood culture (routine x 2)     Status: None (Preliminary result)   Collection Time: 09/04/22  4:01 PM   Specimen: BLOOD  Result Value Ref Range Status   Specimen Description BLOOD LEFT ANTECUBITAL  Final   Special Requests   Final    BOTTLES DRAWN AEROBIC AND ANAEROBIC Blood Culture adequate volume   Culture   Final    NO GROWTH < 24 HOURS Performed at [facility], [address] [place], [place] [address]    Report Status PENDING  Incomplete  Gastrointestinal Panel by PCR , Stool     Status: None   Collection Time: 09/05/22  1:32 AM   Specimen: Stool  Result Value Ref Range Status   Campylobacter species NOT DETECTED NOT DETECTED Final   Plesimonas shigelloides NOT DETECTED NOT DETECTED Final   Salmonella species NOT DETECTED NOT DETECTED Final   Yersinia enterocolitica NOT DETECTED NOT DETECTED Final   Vibrio  species NOT DETECTED NOT DETECTED Final   Vibrio cholerae NOT DETECTED NOT DETECTED Final   Enteroaggregative E coli (EAEC) NOT DETECTED NOT DETECTED Final   Enteropathogenic E coli (EPEC) NOT DETECTED NOT DETECTED Final   Enterotoxigenic E coli (ETEC) NOT DETECTED NOT DETECTED Final   Shiga like toxin producing E coli (STEC) NOT DETECTED NOT DETECTED Final   Shigella/Enteroinvasive E coli (EIEC) NOT DETECTED NOT DETECTED Final   Cryptosporidium NOT DETECTED NOT DETECTED Final   Cyclospora cayetanensis NOT DETECTED NOT DETECTED Final   Entamoeba histolytica NOT DETECTED NOT DETECTED Final   Giardia lamblia NOT DETECTED NOT DETECTED Final   Adenovirus F40/41 NOT DETECTED NOT DETECTED Final   Astrovirus NOT DETECTED NOT DETECTED Final   Norovirus GI/GII NOT DETECTED NOT DETECTED Final   Rotavirus A NOT DETECTED NOT DETECTED Final   Sapovirus (I, II, IV, and V) NOT DETECTED NOT DETECTED Final    Comment: Performed  at [facility], [address] [place], [place] [address]  C Difficile Quick Screen w PCR reflex     Status: None   Collection Time: 09/05/22  1:32 AM   Specimen: STOOL  Result Value Ref Range Status   C Diff antigen NEGATIVE NEGATIVE Final   C Diff toxin NEGATIVE NEGATIVE Final   C Diff interpretation No C. difficile detected.  Final    Comment: Performed at [facility], [address] [place], [place] [address]    Coagulation Studies: No results for input(s): "LABPROT", "INR" in the last 72 hours.  Urinalysis: Recent Labs    09/04/22 1601  COLORURINE YELLOW*  LABSPEC 1.011  PHURINE 6.0  GLUCOSEU 150*  HGBUR SMALL*  BILIRUBINUR NEGATIVE  KETONESUR NEGATIVE  PROTEINUR 100*  NITRITE NEGATIVE  LEUKOCYTESUR NEGATIVE      Imaging: DG Chest Port 1 View  Result Date: 09/04/2022 CLINICAL DATA:  Nausea and vomiting, vertigo EXAM: PORTABLE CHEST 1 VIEW COMPARISON:  05/17/2012 FINDINGS: Single frontal view of the chest demonstrates mild enlargement of the cardiac silhouette, which may be accentuated by portable AP technique. No acute airspace disease, effusion, or pneumothorax. No acute bony abnormalities. IMPRESSION: 1. Mild enlargement the cardiac silhouette, likely projectional. 2. No acute airspace disease. Electronically Signed   By: Randa Ngo M.D.   On: 09/04/2022 17:46   US RENAL  Result Date: 09/04/2022 CLINICAL DATA:  Acute renal insufficiency EXAM: RENAL / URINARY TRACT ULTRASOUND COMPLETE COMPARISON:  09/04/2022 FINDINGS: Right Kidney: Renal measurements: 11.9 x 6.3 x 6.4 cm = volume: 253.6 mL. Echogenicity within normal limits. No mass or hydronephrosis visualized. Left Kidney: Renal measurements: 11.1 x 6.5 x 5.8 cm = volume: 220.8 mL. Echogenicity within normal limits. No mass or hydronephrosis visualized. Bladder: Appears normal for degree of bladder distention. Other: None. IMPRESSION: 1. Unremarkable renal ultrasound. Electronically Signed   By:  Randa Ngo M.D.   On: 09/04/2022 17:21   CT ABDOMEN PELVIS WO CONTRAST  Result Date: 09/04/2022 CLINICAL DATA:  Abdominal pain EXAM: CT ABDOMEN AND PELVIS WITHOUT CONTRAST TECHNIQUE: Multidetector CT imaging of the abdomen and pelvis was performed following the standard protocol without IV contrast. RADIATION DOSE REDUCTION: This exam was performed according to the departmental dose-optimization program which includes automated exposure control, adjustment of the mA and/or kV according to patient size and/or use of iterative reconstruction technique. COMPARISON:  None Available. FINDINGS: Lower chest: Linear dependent subsegmental atelectasis or scarring and no pleural or pericardial effusion. Small hiatal hernia. Hepatobiliary: No focal liver abnormality is seen. No  gallstones, gallbladder wall thickening, or biliary dilatation. Pancreas: Unremarkable. No pancreatic ductal dilatation or surrounding inflammatory changes. Spleen: Normal in size without focal abnormality. Adrenals/Urinary Tract: Adrenal glands are unremarkable. Kidneys are normal, without renal calculi, focal lesion, or hydronephrosis. Bladder is unremarkable. Stomach/Bowel: Stomach is within normal limits. Appendix appears normal. There is thickening of the wall of the descending colon and mild pericolonic fat stranding suggestive of nonspecific colitis. No abscess or obstruction. Vascular/Lymphatic: Aortic atherosclerosis. No enlarged abdominal or pelvic lymph nodes. Reproductive: Status post hysterectomy. No adnexal masses. Other: No abdominal wall hernia or abnormality. No abdominopelvic ascites. Musculoskeletal: No acute or significant osseous findings. IMPRESSION: Descending colon thickening and pericolonic inflammatory changes consistent with nonspecific colitis. Electronically Signed   By: Sammie Bench M.D.   On: 09/04/2022 14:24     Assessment & Plan: Ms. Yaitza Lor is a 62 y.o.  female with past medical conditions  including hyperlipidemia, type 2 diabetes, hypertension, who was admitted to [facility] on 09/04/2022 for End stage renal disease [N18.6] Acute renal failure, unspecified acute renal failure type [N17.9]  Acute kidney injury on suspected chronic kidney disease.  Acute kidney injury likely secondary to prolonged hypovolemia.  Renal ultrasound negative for obstruction.  No IV contrast exposure.  Agree with IV fluids.  Patient encouraged to maintain oral intake.  No acute need for dialysis at this time.  Creatinine has improved to 13.94 with IV fluids.  CKD serologies ordered.  Continue to avoid nephrotoxic agents and therapies, including hypotension.  Will continue to follow patient.  2. Anemia of presumed chronic kidney disease Lab Results  Component Value Date   HGB 9.1 (L) 09/05/2022    Hemoglobin within desired range.  No need for ESA's at this time.  3. Secondary Hyperparathyroidism: with outpatient labs: None  Lab Results  Component Value Date   CALCIUM 8.1 (L) 09/05/2022    Will continue to monitor bone minerals during this admission.  4.  Hypertension, essential.  Home regimen includes lisinopril.  Currently held.  5. Diabetes mellitus type II with renal manifestations: noninsulin dependent. Home regimen includes Farxiga and metformin. Most recent hemoglobin A1c is 7.6 on 08/14/22.  Metformin held   LOS: 1 [place] 4/2/20242:55 PM

## 2022-09-05 NOTE — Progress Notes (Signed)
Initial Nutrition Assessment  DOCUMENTATION CODES:   Obesity unspecified  INTERVENTION:  - Add Nepro Shake po BID, each supplement provides 425 kcal and 19 grams protein  - Add Banatrol BID.   NUTRITION DIAGNOSIS:   Inadequate oral intake related to poor appetite as evidenced by per patient/family report.  GOAL:   Patient will meet greater than or equal to 90% of their needs  MONITOR:   PO intake  REASON FOR ASSESSMENT:   Malnutrition Screening Tool    ASSESSMENT:   62 y.o. female admits related to emesis and abdominal pain. PMH includes: DM, HLD, HTN, restless leg syndrome. Pt is currently receiving medical management related to AKI.  Meds reviewed: sliding scale insulin. Labs reviewed: chloride low, BUN/creatinine high.   The pt reports that she does not have much of an appetite. She states that she has had a poor appetite for the past week. No significant wt loss per record. Per record, pt ate 60% of her breakfast. Pt states that she has been experiencing some nausea and diarrhea. Pt has PRN zofran ordered. RD will add Banatrol BID for now as well as Nepro BID. RD will continue to monitor PO intakes.   NUTRITION - FOCUSED PHYSICAL EXAM:  WDL - no wasting noted.   Diet Order:   Diet Order             Diet renal with fluid restriction Fluid restriction: 1200 mL Fluid; Room service appropriate? Yes; Fluid consistency: Thin  Diet effective now                   EDUCATION NEEDS:   Not appropriate for education at this time  Skin:  Skin Assessment: Reviewed RN Assessment  Last BM:  4/1  Height:   Ht Readings from Last 1 Encounters:  09/04/22 4\' 11"  (1.499 m)    Weight:   Wt Readings from Last 1 Encounters:  09/04/22 74.6 kg    Ideal Body Weight:     BMI:  Body mass index is 33.22 kg/m.  Estimated Nutritional Needs:   Kcal:  [id number] kcals  Protein:  80-95 gm  Fluid:  >/= 1.6 L  Thalia Bloodgood, RD, LDN, CNSC.

## 2022-09-06 DIAGNOSIS — E119 Type 2 diabetes mellitus without complications: Secondary | ICD-10-CM | POA: Diagnosis not present

## 2022-09-06 DIAGNOSIS — I10 Essential (primary) hypertension: Secondary | ICD-10-CM | POA: Diagnosis not present

## 2022-09-06 DIAGNOSIS — R7401 Elevation of levels of liver transaminase levels: Secondary | ICD-10-CM | POA: Diagnosis not present

## 2022-09-06 DIAGNOSIS — N179 Acute kidney failure, unspecified: Secondary | ICD-10-CM | POA: Diagnosis not present

## 2022-09-06 LAB — GLUCOSE, CAPILLARY
Glucose-Capillary: 82 mg/dL (ref 70–99)
Glucose-Capillary: 85 mg/dL (ref 70–99)
Glucose-Capillary: 182 mg/dL — ABNORMAL HIGH (ref 70–99)
Glucose-Capillary: 123 mg/dL — ABNORMAL HIGH (ref 70–99)
Glucose-Capillary: 138 mg/dL — ABNORMAL HIGH (ref 70–99)

## 2022-09-06 LAB — COMPREHENSIVE METABOLIC PANEL
ALT: 130 U/L — ABNORMAL HIGH (ref 0–44)
AST: 77 U/L — ABNORMAL HIGH (ref 15–41)
Albumin: 3 g/dL — ABNORMAL LOW (ref 3.5–5.0)
Alkaline Phosphatase: 50 U/L (ref 38–126)
Anion gap: 16 — ABNORMAL HIGH (ref 5–15)
BUN: 94 mg/dL — ABNORMAL HIGH (ref 8–23)
CO2: 34 mmol/L — ABNORMAL HIGH (ref 22–32)
Calcium: 8 mg/dL — ABNORMAL LOW (ref 8.9–10.3)
Chloride: 93 mmol/L — ABNORMAL LOW (ref 98–111)
Creatinine, Ser: 11.64 mg/dL — ABNORMAL HIGH (ref 0.44–1.00)
GFR, Estimated: 3 mL/min — ABNORMAL LOW (ref >60)
Glucose, Bld: 87 mg/dL (ref 70–99)
Potassium: 3.5 mmol/L (ref 3.5–5.1)
Sodium: 143 mmol/L (ref 135–145)
Total Bilirubin: 0.8 mg/dL (ref 0.3–1.2)
Total Protein: 6 g/dL — ABNORMAL LOW (ref 6.5–8.1)

## 2022-09-06 LAB — KAPPA/LAMBDA LIGHT CHAINS
Kappa free light chain: 130.7 mg/L — ABNORMAL HIGH (ref 3.3–19.4)
Kappa, lambda light chain ratio: 1.94 — ABNORMAL HIGH (ref 0.26–1.65)
Lambda free light chains: 67.2 mg/L — ABNORMAL HIGH (ref 5.7–26.3)

## 2022-09-06 MED ORDER — AMOXICILLIN-POT CLAVULANATE 500-125 MG PO TABS
1.0000 | ORAL_TABLET | Freq: Two times a day (BID) | ORAL | Status: DC
  Administered 2022-09-06 – 2022-09-08 (×5): 1 via ORAL
  Filled 2022-09-06 (×5): qty 1

## 2022-09-06 NOTE — Progress Notes (Signed)
Barbara Holmes at [place] NAME: Barbara Holmes    MR#:  [id number]  DATE OF BIRTH:  November 19, 1960  SUBJECTIVE:   Pt's husband at bedside. Patient came in with recurrent nausea vomiting and diarrhea for a week. Diarrhea has slowed down has been tolerating some PO. Severely dehydrated with creatinine of 17.0. Does not have any underlying kidney disorder.  VITALS:  Blood pressure (!) 123/54, pulse (!) 56, temperature 98.4 F (36.9 C), resp. rate 18, height 4\' 11"  (1.499 m), weight 74.6 kg, SpO2 97 %.  PHYSICAL EXAMINATION:   GENERAL:  62 y.o.-year-old patient with no acute distress. weak LUNGS: Normal breath sounds bilaterally, no wheezing CARDIOVASCULAR: S1, S2 normal. No murmur   ABDOMEN: Soft, nontender, nondistended. Bowel sounds present.  EXTREMITIES: No  edema b/l.    NEUROLOGIC: nonfocal  patient is alert and awake SKIN: No obvious rash, lesion, or ulcer.   LABORATORY PANEL:  CBC Recent Labs  Lab 09/05/22 0440  WBC 7.9  HGB 9.1*  HCT 27.4*  PLT 163     Chemistries  Recent Labs  Lab 09/06/22 0549  NA 143  K 3.5  CL 93*  CO2 34*  GLUCOSE 87  BUN 94*  CREATININE 11.64*  CALCIUM 8.0*  AST 77*  ALT 130*  ALKPHOS 50  BILITOT 0.8     RADIOLOGY:  DG Chest Port 1 View  Result Date: 09/04/2022 CLINICAL DATA:  Nausea and vomiting, vertigo EXAM: PORTABLE CHEST 1 VIEW COMPARISON:  05/17/2012 FINDINGS: Single frontal view of the chest demonstrates mild enlargement of the cardiac silhouette, which may be accentuated by portable AP technique. No acute airspace disease, effusion, or pneumothorax. No acute bony abnormalities. IMPRESSION: 1. Mild enlargement the cardiac silhouette, likely projectional. 2. No acute airspace disease. Electronically Signed   By: Randa Ngo M.D.   On: 09/04/2022 17:46   US RENAL  Result Date: 09/04/2022 CLINICAL DATA:  Acute renal insufficiency EXAM: RENAL / URINARY TRACT ULTRASOUND COMPLETE COMPARISON:   09/04/2022 FINDINGS: Right Kidney: Renal measurements: 11.9 x 6.3 x 6.4 cm = volume: 253.6 mL. Echogenicity within normal limits. No mass or hydronephrosis visualized. Left Kidney: Renal measurements: 11.1 x 6.5 x 5.8 cm = volume: 220.8 mL. Echogenicity within normal limits. No mass or hydronephrosis visualized. Bladder: Appears normal for degree of bladder distention. Other: None. IMPRESSION: 1. Unremarkable renal ultrasound. Electronically Signed   By: Randa Ngo M.D.   On: 09/04/2022 17:21   CT ABDOMEN PELVIS WO CONTRAST  Result Date: 09/04/2022 CLINICAL DATA:  Abdominal pain EXAM: CT ABDOMEN AND PELVIS WITHOUT CONTRAST TECHNIQUE: Multidetector CT imaging of the abdomen and pelvis was performed following the standard protocol without IV contrast. RADIATION DOSE REDUCTION: This exam was performed according to the departmental dose-optimization program which includes automated exposure control, adjustment of the mA and/or kV according to patient size and/or use of iterative reconstruction technique. COMPARISON:  None Available. FINDINGS: Lower chest: Linear dependent subsegmental atelectasis or scarring and no pleural or pericardial effusion. Small hiatal hernia. Hepatobiliary: No focal liver abnormality is seen. No gallstones, gallbladder wall thickening, or biliary dilatation. Pancreas: Unremarkable. No pancreatic ductal dilatation or surrounding inflammatory changes. Spleen: Normal in size without focal abnormality. Adrenals/Urinary Tract: Adrenal glands are unremarkable. Kidneys are normal, without renal calculi, focal lesion, or hydronephrosis. Bladder is unremarkable. Stomach/Bowel: Stomach is within normal limits. Appendix appears normal. There is thickening of the wall of the descending colon and mild pericolonic fat stranding suggestive of nonspecific colitis. No  abscess or obstruction. Vascular/Lymphatic: Aortic atherosclerosis. No enlarged abdominal or pelvic lymph nodes. Reproductive: Status post  hysterectomy. No adnexal masses. Other: No abdominal wall hernia or abnormality. No abdominopelvic ascites. Musculoskeletal: No acute or significant osseous findings. IMPRESSION: Descending colon thickening and pericolonic inflammatory changes consistent with nonspecific colitis. Electronically Signed   By: Sammie Bench M.D.   On: 09/04/2022 14:24    Assessment and Plan  Barbara Holmes is a 62 y.o. female with medical history significant of type 2 diabetes, hyperlipidemia, hypertension presenting with severe AKI, colitis, transaminitis.  Patient reports generalized malaise, recurrent nausea vomiting diarrhea over the past week or so.   CT of the abdomen with descending colon thickening concerning for nonspecific colitis.  Came in with creat of 17.0 (baseline creat 0.96)  AKI (acute kidney injury) appears due to severe dehydration from G.I. losses. --Patient with new onset severe renal failure with creatinine of 17.3--13.0--11.0 --baseline creatinine around 1.0 --Noted concurrent colitis with decreased p.o. intake --Also with home ACE inhibitor use --No significant acute renal/ureteral pathology noted on CT A&P -- nephrology consultation with Dr. Juleen China. Continue IV hydration. Creatinine trending down. -- Renal ultrasound negative for obstruction  Transaminitis AST 233, ALT 240 ?  Ischemic injury in the setting of colitis and AKI -- acute hepatitis panel-- negative --CT abdomen and pelvis negative for any hepatobiliary disease -- levels trending down -- consider ultrasound abdomen if patient is symptomatic currently no symptoms   Acute colitis --Positive nausea and abdominal pain x 1 week with noted nonspecific colitis on CT  --Will place on empiric Rocephin and azithromycin for infectious coverage--wbc normal--change to po augmentin --C. difficile negative   Diabetes mellitus, type 2, hyperglycemia SSI for now till po intake improves   Essential (primary) hypertension --BP  stable  ---Titrate home regimen  -Hold nephrotoxic agents     Family communication :husband Consults : nephrology CODE STATUS: full DVT Prophylaxis : heparin Level of care: Telemetry Medical Status is: Inpatient Remains inpatient appropriate because: acute renal failure receiving IV fluids monitor creatinine    TOTAL TIME TAKING CARE OF THIS PATIENT: 35 minutes.  >50% time spent on counselling and coordination of care  Note: This dictation was prepared with Dragon dictation along with smaller phrase technology. Any transcriptional errors that result from this process are unintentional.  Fritzi Mandes M.D    [facility]   CC: Primary care physician; [name], [facility] [place] NAME: Barbara Holmes    MR#:  [id number]  DATE OF BIRTH:  Oct 31, 1960  SUBJECTIVE:      VITALS:  Blood pressure (!) 123/54, pulse (!) 56, temperature 98.4 F (36.9 C), resp. rate 18, height 4\' 11"  (1.499 m), weight 74.6 kg, SpO2 97 %.  PHYSICAL EXAMINATION:   GENERAL:  62 y.o.-year-old patient with no acute distress.  LUNGS: Normal breath sounds bilaterally, no wheezing CARDIOVASCULAR: S1, S2 normal. No murmur   ABDOMEN: Soft, nontender, nondistended. Bowel sounds present.  EXTREMITIES: No  edema b/l.    NEUROLOGIC: nonfocal  patient is alert and awake SKIN: No obvious rash, lesion, or ulcer.   LABORATORY PANEL:  CBC Recent Labs  Lab 09/05/22 0440  WBC 7.9  HGB 9.1*  HCT 27.4*  PLT 163     Chemistries  Recent Labs  Lab 09/06/22 0549  NA 143  K 3.5  CL 93*  CO2 34*  GLUCOSE 87  BUN 94*  CREATININE 11.64*  CALCIUM 8.0*  AST 77*  ALT 130*  ALKPHOS 50  BILITOT 0.8    Cardiac Enzymes No results for input(s): "TROPONINI" in the last 168 hours. RADIOLOGY:  DG Chest Port 1 View  Result Date: 09/04/2022 CLINICAL DATA:  Nausea and vomiting, vertigo EXAM: PORTABLE CHEST 1 VIEW COMPARISON:  05/17/2012  FINDINGS: Single frontal view of the chest demonstrates mild enlargement of the cardiac silhouette, which may be accentuated by portable AP technique. No acute airspace disease, effusion, or pneumothorax. No acute bony abnormalities. IMPRESSION: 1. Mild enlargement the cardiac silhouette, likely projectional. 2. No acute airspace disease. Electronically Signed   By: Randa Ngo M.D.   On: 09/04/2022 17:46   US RENAL  Result Date: 09/04/2022 CLINICAL DATA:  Acute renal insufficiency EXAM: RENAL / URINARY TRACT ULTRASOUND COMPLETE COMPARISON:  09/04/2022 FINDINGS: Right Kidney: Renal measurements: 11.9 x 6.3 x 6.4 cm = volume: 253.6 mL. Echogenicity within normal limits. No mass or hydronephrosis visualized. Left Kidney: Renal measurements: 11.1 x 6.5 x 5.8 cm = volume: 220.8 mL. Echogenicity within normal limits. No mass or hydronephrosis visualized. Bladder: Appears normal for degree of bladder distention. Other: None. IMPRESSION: 1. Unremarkable renal ultrasound. Electronically Signed   By: Randa Ngo M.D.   On: 09/04/2022 17:21   CT ABDOMEN PELVIS WO CONTRAST  Result Date: 09/04/2022 CLINICAL DATA:  Abdominal pain EXAM: CT ABDOMEN AND PELVIS WITHOUT CONTRAST TECHNIQUE: Multidetector CT imaging of the abdomen and pelvis was performed following the standard protocol without IV contrast. RADIATION DOSE REDUCTION: This exam was performed according to the departmental dose-optimization program which includes automated exposure control, adjustment of the mA and/or kV according to patient size and/or use of iterative reconstruction technique. COMPARISON:  None Available. FINDINGS: Lower chest: Linear dependent subsegmental atelectasis or scarring and no pleural or pericardial effusion. Small hiatal hernia. Hepatobiliary: No focal liver abnormality is seen. No gallstones, gallbladder wall thickening, or biliary dilatation. Pancreas: Unremarkable. No pancreatic ductal dilatation or surrounding inflammatory  changes. Spleen: Normal in size without focal abnormality. Adrenals/Urinary Tract: Adrenal glands are unremarkable. Kidneys are normal, without renal calculi, focal lesion, or hydronephrosis. Bladder is unremarkable. Stomach/Bowel: Stomach is within normal limits. Appendix appears normal. There is thickening of the wall of the descending colon and mild pericolonic fat stranding suggestive of nonspecific colitis. No abscess or obstruction. Vascular/Lymphatic: Aortic atherosclerosis. No enlarged abdominal or pelvic lymph nodes. Reproductive: Status post hysterectomy. No adnexal masses. Other: No abdominal wall hernia or abnormality. No abdominopelvic ascites. Musculoskeletal: No acute or significant osseous findings. IMPRESSION: Descending colon thickening and pericolonic inflammatory changes consistent with nonspecific colitis. Electronically Signed   By: Sammie Bench M.D.   On: 09/04/2022 14:24    Assessment and Plan      Procedures: Family communication : Consults : CODE STATUS:  DVT Prophylaxis : Level of care: Telemetry Medical Status is: Inpatient Remains inpatient appropriate because: acute renal failure, IV fluids, monitoring creatinine    TOTAL TIME TAKING CARE OF THIS PATIENT: 35 minutes.  >50% time spent on counselling and coordination of care  Note: This dictation was prepared with Dragon dictation along with smaller phrase technology. Any transcriptional errors that result from this process are unintentional.  Fritzi Mandes M.D    [facility]   CC: Primary care physician; [name], [place]

## 2022-09-06 NOTE — Plan of Care (Signed)
  Problem: Education: Goal: Knowledge of General Education information will improve Description: Including pain rating scale, medication(s)/side effects and non-pharmacologic comfort measures Outcome: Progressing   Problem: Clinical Measurements: Goal: Will remain free from infection Outcome: Progressing   Problem: Clinical Measurements: Goal: Respiratory complications will improve Outcome: Progressing   Problem: Clinical Measurements: Goal: Cardiovascular complication will be avoided Outcome: Progressing   Problem: Activity: Goal: Risk for activity intolerance will decrease Outcome: Progressing   Problem: Pain Managment: Goal: General experience of comfort will improve Outcome: Progressing   

## 2022-09-06 NOTE — Progress Notes (Signed)
[facility]  ROUNDING NOTE   Subjective:   Patient seen resting quietly in bed, no family at bedside Alert and oriented Appetite remains poor, denies nausea or vomiting No lower extremity edema  Creatinine 11.64  Objective:  Vital signs in last 24 hours:  Temp:  [98.3 F (36.8 C)-98.4 F (36.9 C)] 98.4 F (36.9 C) (04/03 0808) Pulse Rate:  [56-63] 56 (04/03 0808) Resp:  [18-19] 18 (04/03 0808) BP: (123-158)/(54-80) 123/54 (04/03 0808) SpO2:  [88 %-97 %] 97 % (04/03 0808)  Weight change:  Filed Weights   09/04/22 1153 09/04/22 2215  Weight: 72.1 kg 74.6 kg    Intake/Output: I/O last 3 completed shifts: In: 3676.8 [P.O.:718; I.V.:2958.8] Out: 1150 [Urine:1150]   Intake/Output this shift:  Total I/O In: 240 [P.O.:240] Out: -   Physical Exam: General: NAD   Head: Normocephalic, atraumatic. Moist oral mucosal membranes  Eyes: Anicteric  Lungs:  Clear to auscultation.normal effort  Heart: Regular rate and rhythm  Abdomen:  Soft, nontender  Extremities:  No peripheral edema.  Neurologic: Nonfocal, moving all four extremities  Skin: No lesions  Access: None     Basic Metabolic Panel: Recent Labs  Lab 09/04/22 1157 09/05/22 1013 09/06/22 0549  NA 142 143 143  K 5.6* 4.4 3.5  CL 96* 93* 93*  CO2 24 31 34*  GLUCOSE 101* 97 87  BUN 129* 112* 94*  CREATININE 17.30* 13.94* 11.64*  CALCIUM 9.1 8.1* 8.0*    Liver Function Tests: Recent Labs  Lab 09/04/22 1157 09/05/22 1013 09/06/22 0549  AST 233* 172* 77*  ALT 240* 196* 130*  ALKPHOS 70 60 50  BILITOT 0.6 0.7 0.8  PROT 7.6 6.3* 6.0*  ALBUMIN 3.7 3.3* 3.0*   Recent Labs  Lab 09/04/22 1157  LIPASE 104*   No results for input(s): "AMMONIA" in the last 168 hours.  CBC: Recent Labs  Lab 09/04/22 1157 09/05/22 0440  WBC 9.3 7.9  HGB 11.4* 9.1*  HCT 34.2* 27.4*  MCV 88.4 88.4  PLT 227 163    Cardiac Enzymes: Recent Labs  Lab 09/04/22 1807  CKTOTAL 50    BNP: Invalid  input(s): "POCBNP"  CBG: Recent Labs  Lab 09/05/22 1301 09/05/22 1614 09/06/22 0057 09/06/22 0806 09/06/22 1142  GLUCAP 86 97 82 85 182*    Microbiology: Results for orders placed or performed during the hospital encounter of 09/04/22  SARS Coronavirus 2 by RT PCR (hospital order, performed in [facility] hospital lab) *cepheid single result test* Anterior Nasal Swab     Status: None   Collection Time: 09/04/22 11:57 AM   Specimen: Anterior Nasal Swab  Result Value Ref Range Status   SARS Coronavirus 2 by RT PCR NEGATIVE NEGATIVE Final    Comment: (NOTE) SARS-CoV-2 target nucleic acids are NOT DETECTED.  The SARS-CoV-2 RNA is generally detectable in upper and lower respiratory specimens during the acute phase of infection. The lowest concentration of SARS-CoV-2 viral copies this assay can detect is 250 copies / mL. A negative result does not preclude SARS-CoV-2 infection and should not be used as the sole basis for treatment or other patient management decisions.  A negative result may occur with improper specimen collection / handling, submission of specimen other than nasopharyngeal swab, presence of viral mutation(s) within the areas targeted by this assay, and inadequate number of viral copies (<250 copies / mL). A negative result must be combined with clinical observations, patient history, and epidemiological information.  Fact Sheet for Patients:  https://www.patel.info/  Fact Sheet for Healthcare Providers: https://hall.com/  This test is not yet approved or  cleared by the [place] FDA and has been authorized for detection and/or diagnosis of SARS-CoV-2 by FDA under an Emergency Use Authorization (EUA).  This EUA will remain in effect (meaning this test can be used) for the duration of the COVID-19 declaration under Section 564(b)(1) of the Act, 21 U.S.C. section 360bbb-3(b)(1), unless the authorization is  terminated or revoked sooner.  Performed at [facility], [address] [place], [place] [address]   Blood culture (routine x 2)     Status: None (Preliminary result)   Collection Time: 09/04/22  3:07 PM   Specimen: BLOOD  Result Value Ref Range Status   Specimen Description BLOOD BLOOD LEFT ARM  Final   Special Requests   Final    BOTTLES DRAWN AEROBIC AND ANAEROBIC Blood Culture results may not be optimal due to an excessive volume of blood received in culture bottles   Culture   Final    NO GROWTH 2 DAYS Performed at [facility], [address], [place], [place] [address]    Report Status PENDING  Incomplete  Blood culture (routine x 2)     Status: None (Preliminary result)   Collection Time: 09/04/22  4:01 PM   Specimen: BLOOD  Result Value Ref Range Status   Specimen Description BLOOD LEFT ANTECUBITAL  Final   Special Requests   Final    BOTTLES DRAWN AEROBIC AND ANAEROBIC Blood Culture adequate volume   Culture   Final    NO GROWTH 2 DAYS Performed at [facility], [address], [place], [place] [address]    Report Status PENDING  Incomplete  Gastrointestinal Panel by PCR , Stool     Status: None   Collection Time: 09/05/22  1:32 AM   Specimen: Stool  Result Value Ref Range Status   Campylobacter species NOT DETECTED NOT DETECTED Final   Plesimonas shigelloides NOT DETECTED NOT DETECTED Final   Salmonella species NOT DETECTED NOT DETECTED Final   Yersinia enterocolitica NOT DETECTED NOT DETECTED Final   Vibrio species NOT DETECTED NOT DETECTED Final   Vibrio cholerae NOT DETECTED NOT DETECTED Final   Enteroaggregative E coli (EAEC) NOT DETECTED NOT DETECTED Final   Enteropathogenic E coli (EPEC) NOT DETECTED NOT DETECTED Final   Enterotoxigenic E coli (ETEC) NOT DETECTED NOT DETECTED Final   Shiga like toxin producing E coli (STEC) NOT DETECTED NOT DETECTED Final   Shigella/Enteroinvasive E coli (EIEC) NOT DETECTED NOT DETECTED  Final   Cryptosporidium NOT DETECTED NOT DETECTED Final   Cyclospora cayetanensis NOT DETECTED NOT DETECTED Final   Entamoeba histolytica NOT DETECTED NOT DETECTED Final   Giardia lamblia NOT DETECTED NOT DETECTED Final   Adenovirus F40/41 NOT DETECTED NOT DETECTED Final   Astrovirus NOT DETECTED NOT DETECTED Final   Norovirus GI/GII NOT DETECTED NOT DETECTED Final   Rotavirus A NOT DETECTED NOT DETECTED Final   Sapovirus (I, II, IV, and V) NOT DETECTED NOT DETECTED Final    Comment: Performed at [facility], [address] [place], [place] [address]  C Difficile Quick Screen w PCR reflex     Status: None   Collection Time: 09/05/22  1:32 AM   Specimen: STOOL  Result Value Ref Range Status   C Diff antigen NEGATIVE NEGATIVE Final   C Diff toxin NEGATIVE NEGATIVE Final   C Diff interpretation No C. difficile detected.  Final  Comment: Performed at [facility], [address] [place], [place] [address]    Coagulation Studies: No results for input(s): "LABPROT", "INR" in the last 72 hours.  Urinalysis: Recent Labs    09/04/22 1601  COLORURINE YELLOW*  LABSPEC 1.011  PHURINE 6.0  GLUCOSEU 150*  HGBUR SMALL*  BILIRUBINUR NEGATIVE  KETONESUR NEGATIVE  PROTEINUR 100*  NITRITE NEGATIVE  LEUKOCYTESUR NEGATIVE      Imaging: DG Chest Port 1 View  Result Date: 09/04/2022 CLINICAL DATA:  Nausea and vomiting, vertigo EXAM: PORTABLE CHEST 1 VIEW COMPARISON:  05/17/2012 FINDINGS: Single frontal view of the chest demonstrates mild enlargement of the cardiac silhouette, which may be accentuated by portable AP technique. No acute airspace disease, effusion, or pneumothorax. No acute bony abnormalities. IMPRESSION: 1. Mild enlargement the cardiac silhouette, likely projectional. 2. No acute airspace disease. Electronically Signed   By: Randa Ngo M.D.   On: 09/04/2022 17:46   US RENAL  Result Date: 09/04/2022 CLINICAL DATA:  Acute renal insufficiency EXAM:  RENAL / URINARY TRACT ULTRASOUND COMPLETE COMPARISON:  09/04/2022 FINDINGS: Right Kidney: Renal measurements: 11.9 x 6.3 x 6.4 cm = volume: 253.6 mL. Echogenicity within normal limits. No mass or hydronephrosis visualized. Left Kidney: Renal measurements: 11.1 x 6.5 x 5.8 cm = volume: 220.8 mL. Echogenicity within normal limits. No mass or hydronephrosis visualized. Bladder: Appears normal for degree of bladder distention. Other: None. IMPRESSION: 1. Unremarkable renal ultrasound. Electronically Signed   By: Randa Ngo M.D.   On: 09/04/2022 17:21   CT ABDOMEN PELVIS WO CONTRAST  Result Date: 09/04/2022 CLINICAL DATA:  Abdominal pain EXAM: CT ABDOMEN AND PELVIS WITHOUT CONTRAST TECHNIQUE: Multidetector CT imaging of the abdomen and pelvis was performed following the standard protocol without IV contrast. RADIATION DOSE REDUCTION: This exam was performed according to the departmental dose-optimization program which includes automated exposure control, adjustment of the mA and/or kV according to patient size and/or use of iterative reconstruction technique. COMPARISON:  None Available. FINDINGS: Lower chest: Linear dependent subsegmental atelectasis or scarring and no pleural or pericardial effusion. Small hiatal hernia. Hepatobiliary: No focal liver abnormality is seen. No gallstones, gallbladder wall thickening, or biliary dilatation. Pancreas: Unremarkable. No pancreatic ductal dilatation or surrounding inflammatory changes. Spleen: Normal in size without focal abnormality. Adrenals/Urinary Tract: Adrenal glands are unremarkable. Kidneys are normal, without renal calculi, focal lesion, or hydronephrosis. Bladder is unremarkable. Stomach/Bowel: Stomach is within normal limits. Appendix appears normal. There is thickening of the wall of the descending colon and mild pericolonic fat stranding suggestive of nonspecific colitis. No abscess or obstruction. Vascular/Lymphatic: Aortic atherosclerosis. No enlarged  abdominal or pelvic lymph nodes. Reproductive: Status post hysterectomy. No adnexal masses. Other: No abdominal wall hernia or abnormality. No abdominopelvic ascites. Musculoskeletal: No acute or significant osseous findings. IMPRESSION: Descending colon thickening and pericolonic inflammatory changes consistent with nonspecific colitis. Electronically Signed   By: Sammie Bench M.D.   On: 09/04/2022 14:24     Medications:    lactated ringers 100 mL/hr at 09/06/22 1144   promethazine (PHENERGAN) injection (IM or IVPB) 12.5 mg (09/05/22 1912)    amoxicillin-clavulanate  1 tablet Oral Q12H   feeding supplement (NEPRO CARB STEADY)  237 mL Oral BID BM   fiber supplement (BANATROL TF)  60 mL Oral BID   heparin  5,000 Units Subcutaneous Q8H   insulin aspart  0-6 Units Subcutaneous TID WC   acetaminophen, ondansetron **OR** ondansetron (ZOFRAN) IV, oxyCODONE, promethazine (PHENERGAN) injection (IM or IVPB)  Assessment/ Plan:  Ms. Barbara  Kimanh Holmes is a 62 y.o.  female  with past medical conditions including hyperlipidemia, type 2 diabetes, hypertension, who was admitted to [facility] on 09/04/2022 for End stage renal disease [N18.6] Acute renal failure, unspecified acute renal failure type [N17.9]   Acute kidney injury on suspected chronic kidney disease. Acute kidney injury likely secondary to prolonged hypovolemia. Renal ultrasound negative for obstruction. No IV contrast exposure.   Creatinine continues to improve. Continue IVF for now. Continue to encourage patient to increase oral intake.  Continue to avoid nephrotoxic agents and therapies.  Lab Results  Component Value Date   CREATININE 11.64 (H) 09/06/2022   CREATININE 13.94 (H) 09/05/2022   CREATININE 17.30 (H) 09/04/2022    Intake/Output Summary (Last 24 hours) at 09/06/2022 1308 Last data filed at 09/06/2022 1043 Gross per 24 hour  Intake 1909.6 ml  Output 1150 ml  Net 759.6 ml   2. Anemia of chronic kidney disease Lab Results   Component Value Date   HGB 9.1 (L) 09/05/2022    Hemoglobin remains at goal.  3. Secondary Hyperparathyroidism: with outpatient labs: None Lab Results  Component Value Date   CALCIUM 8.0 (L) 09/06/2022    Calcium within desired range.  Will continue to monitor bone minerals.  4.  Hypertension, essential.  Home regimen includes lisinopril.  Currently held.  Blood pressure acceptable.   5. Diabetes mellitus type II with renal manifestations: noninsulin dependent. Home regimen includes Farxiga and metformin. Most recent hemoglobin A1c is 7.6 on 08/14/22.  Metformin held   LOS: 2 [place] 4/3/20241:08 PM

## 2022-09-07 DIAGNOSIS — I10 Essential (primary) hypertension: Secondary | ICD-10-CM | POA: Diagnosis not present

## 2022-09-07 DIAGNOSIS — N179 Acute kidney failure, unspecified: Secondary | ICD-10-CM | POA: Diagnosis not present

## 2022-09-07 DIAGNOSIS — R7401 Elevation of levels of liver transaminase levels: Secondary | ICD-10-CM | POA: Diagnosis not present

## 2022-09-07 DIAGNOSIS — E119 Type 2 diabetes mellitus without complications: Secondary | ICD-10-CM | POA: Diagnosis not present

## 2022-09-07 LAB — BASIC METABOLIC PANEL
Anion gap: 16 — ABNORMAL HIGH (ref 5–15)
BUN: 69 mg/dL — ABNORMAL HIGH (ref 8–23)
CO2: 31 mmol/L (ref 22–32)
Calcium: 8.1 mg/dL — ABNORMAL LOW (ref 8.9–10.3)
Chloride: 99 mmol/L (ref 98–111)
Creatinine, Ser: 7.9 mg/dL — ABNORMAL HIGH (ref 0.44–1.00)
GFR, Estimated: 5 mL/min — ABNORMAL LOW (ref >60)
Glucose, Bld: 121 mg/dL — ABNORMAL HIGH (ref 70–99)
Potassium: 3 mmol/L — ABNORMAL LOW (ref 3.5–5.1)
Sodium: 146 mmol/L — ABNORMAL HIGH (ref 135–145)

## 2022-09-07 LAB — PROTEIN ELECTROPHORESIS, SERUM
A/G Ratio: 1 (ref 0.7–1.7)
Albumin ELP: 2.9 g/dL (ref 2.9–4.4)
Alpha-1-Globulin: 0.4 g/dL (ref 0.0–0.4)
Alpha-2-Globulin: 1 g/dL (ref 0.4–1.0)
Beta Globulin: 0.8 g/dL (ref 0.7–1.3)
Gamma Globulin: 0.9 g/dL (ref 0.4–1.8)
Globulin, Total: 3 g/dL (ref 2.2–3.9)
Total Protein ELP: 5.9 g/dL — ABNORMAL LOW (ref 6.0–8.5)

## 2022-09-07 LAB — GLUCOSE, CAPILLARY
Glucose-Capillary: 79 mg/dL (ref 70–99)
Glucose-Capillary: 150 mg/dL — ABNORMAL HIGH (ref 70–99)
Glucose-Capillary: 97 mg/dL (ref 70–99)
Glucose-Capillary: 117 mg/dL — ABNORMAL HIGH (ref 70–99)

## 2022-09-07 LAB — CREATININE, SERUM
Creatinine, Ser: 8.38 mg/dL — ABNORMAL HIGH (ref 0.44–1.00)
GFR, Estimated: 5 mL/min — ABNORMAL LOW (ref >60)

## 2022-09-07 MED ORDER — POTASSIUM CHLORIDE CRYS ER 20 MEQ PO TBCR
40.0000 meq | EXTENDED_RELEASE_TABLET | Freq: Once | ORAL | Status: AC
  Administered 2022-09-07: 40 meq via ORAL
  Filled 2022-09-07: qty 2

## 2022-09-07 NOTE — Progress Notes (Signed)
[facility] [place] NAME: Barbara Holmes    MR#:  [id number]  DATE OF BIRTH:  26-Oct-1960  SUBJECTIVE:   Pt's husband at bedside. Patient came in with recurrent nausea vomiting and diarrhea for a week. Diarrhea has slowed down has been tolerating some PO. Severely dehydrated with creatinine of 17.0.   No diarrhea VITALS:  Blood pressure (!) 122/51, pulse (!) 53, temperature 98.5 F (36.9 C), temperature source Oral, resp. rate 18, height 4\' 11"  (1.499 m), weight 74.6 kg, SpO2 91 %.  PHYSICAL EXAMINATION:   GENERAL:  62 y.o.-year-old patient with no acute distress. weak LUNGS: Normal breath sounds bilaterally, no wheezing CARDIOVASCULAR: S1, S2 normal. No murmur   ABDOMEN: Soft, nontender, nondistended. Bowel sounds present.  EXTREMITIES: No  edema b/l.    NEUROLOGIC: nonfocal  patient is alert and awake SKIN: No obvious rash, lesion, or ulcer.   LABORATORY PANEL:  CBC Recent Labs  Lab 09/05/22 0440  WBC 7.9  HGB 9.1*  HCT 27.4*  PLT 163     Chemistries  Recent Labs  Lab 09/06/22 0549 09/07/22 0750  NA 143  --   K 3.5  --   CL 93*  --   CO2 34*  --   GLUCOSE 87  --   BUN 94*  --   CREATININE 11.64* 8.38*  CALCIUM 8.0*  --   AST 77*  --   ALT 130*  --   ALKPHOS 50  --   BILITOT 0.8  --      RADIOLOGY:  No results found.  Assessment and Plan  Barbara Holmes is a 62 y.o. female with medical history significant of type 2 diabetes, hyperlipidemia, hypertension presenting with severe AKI, colitis, transaminitis.  Patient reports generalized malaise, recurrent nausea vomiting diarrhea over the past week or so.   CT of the abdomen with descending colon thickening concerning for nonspecific colitis.  Came in with creat of 17.0 (baseline creat 0.96)  AKI (acute kidney injury) appears due to severe dehydration from G.I. losses. --Patient with new onset severe renal failure with creatinine of  17.3--13.0--11.0--8.0 --baseline creatinine around 1.0 --Noted concurrent colitis with decreased p.o. intake --Also with home ACE inhibitor use --No significant acute renal/ureteral pathology noted on CT A&P -- nephrology consultation with Dr. Juleen China. Continue IV hydration. Creatinine trending down. -- Renal ultrasound negative for obstruction  Transaminitis AST 233, ALT 240 ?  Ischemic injury in the setting of colitis and AKI -- acute hepatitis panel-- negative --CT abdomen and pelvis negative for any hepatobiliary disease -- levels trending down -- consider ultrasound abdomen if patient is symptomatic currently no symptoms   Acute colitis --Positive nausea and abdominal pain x 1 week with noted nonspecific colitis on CT  --Will place on empiric Rocephin and azithromycin for infectious coverage--wbc normal--change to po augmentin --C. difficile negative --diarrhea resolved   Diabetes mellitus, type 2, hyperglycemia SSI for now till po intake improves   Essential (primary) hypertension --BP stable  ---Titrate home regimen  -Hold nephrotoxic agents     Family communication :husband Consults : nephrology CODE STATUS: full DVT Prophylaxis : heparin Level of care: Telemetry Medical Status is: Inpatient Remains inpatient appropriate because: acute renal failure receiving IV fluids monitor creatinine   If remains stable d/c in am.  TOTAL TIME TAKING CARE OF THIS PATIENT: 35 minutes.  >50% time spent on counselling and coordination of care  Note: This dictation was prepared with Dragon dictation along  with smaller phrase technology. Any transcriptional errors that result from this process are unintentional.  Fritzi Mandes M.D    [facility]   CC: Primary care physician; [name], [place] [facility] [place] NAME: Barbara Holmes    MR#:  [id number]  DATE OF BIRTH:  August 21, 1960  SUBJECTIVE:      VITALS:   Blood pressure (!) 122/51, pulse (!) 53, temperature 98.5 F (36.9 C), temperature source Oral, resp. rate 18, height 4\' 11"  (1.499 m), weight 74.6 kg, SpO2 91 %.  PHYSICAL EXAMINATION:   GENERAL:  62 y.o.-year-old patient with no acute distress.  LUNGS: Normal breath sounds bilaterally, no wheezing CARDIOVASCULAR: S1, S2 normal. No murmur   ABDOMEN: Soft, nontender, nondistended. Bowel sounds present.  EXTREMITIES: No  edema b/l.    NEUROLOGIC: nonfocal  patient is alert and awake SKIN: No obvious rash, lesion, or ulcer.   LABORATORY PANEL:  CBC Recent Labs  Lab 09/05/22 0440  WBC 7.9  HGB 9.1*  HCT 27.4*  PLT 163     Chemistries  Recent Labs  Lab 09/06/22 0549 09/07/22 0750  NA 143  --   K 3.5  --   CL 93*  --   CO2 34*  --   GLUCOSE 87  --   BUN 94*  --   CREATININE 11.64* 8.38*  CALCIUM 8.0*  --   AST 77*  --   ALT 130*  --   ALKPHOS 50  --   BILITOT 0.8  --     Cardiac Enzymes No results for input(s): "TROPONINI" in the last 168 hours. RADIOLOGY:  No results found.  Assessment and Plan      Procedures: Family communication : Consults : CODE STATUS:  DVT Prophylaxis : Level of care: Telemetry Medical Status is: Inpatient Remains inpatient appropriate because: acute renal failure, IV fluids, monitoring creatinine    TOTAL TIME TAKING CARE OF THIS PATIENT: 35 minutes.  >50% time spent on counselling and coordination of care  Note: This dictation was prepared with Dragon dictation along with smaller phrase technology. Any transcriptional errors that result from this process are unintentional.  Fritzi Mandes M.D    [facility]   CC: Primary care physician; [name], MD

## 2022-09-07 NOTE — Consult Note (Signed)
   [facility] CM Inpatient Consult   09/07/2022  Barbara Holmes 07-29-1960 [id number]  Orientation with Natividad Brood, [facility] Liaison for review.   Location: [facility] Liaison screened remotely [facility]).   [facility]ana Diaz [facility]) [place] Patient: Proofreader)    Primary Care Provider:  [name], MD with [facility]  Patient screened for readmission hospitalization with noted low risk score for unplanned readmission risk with 1 IP in 6 months. [facility] RN liaison will assess for potential [facility]) Care Management service needs for post hospital transition for care coordination.     Plan:  [facility] RN will continue to follow ongoing disposition to assess for post hospital community care coordination/management needs.  Referral request for community care coordination: pending disposition.   [facility] does not replace or interfere with any arrangements made by the Inpatient Transition of Care team.   For questions contact:    Raina Mina, RN, [place] Hours M-F 8:00 am to 5 pm [phone] mobile [phone] [Office toll free line][facility] Office Hours are M-F 8:30 - 5 pm 24 hour nurse advise line [phone] Conceirge  [email or web address]

## 2022-09-07 NOTE — Progress Notes (Signed)
[facility]  ROUNDING NOTE   Subjective:   Patient seen ambulating in the hall Reports some dizziness when returning to her room Appetite remains poor, states food makes her nauseous.   Patient seen later in morning, was able to tolerate Cheerios for breakfast.   Creatinine 8.38 (11.64)  Objective:  Vital signs in last 24 hours:  Temp:  [98 F (36.7 C)-98.5 F (36.9 C)] 98.5 F (36.9 C) (04/04 0738) Pulse Rate:  [53-65] 53 (04/04 0738) Resp:  [16-20] 18 (04/04 0738) BP: (122-150)/(51-104) 122/51 (04/04 0738) SpO2:  [90 %-94 %] 91 % (04/04 0738)  Weight change:  Filed Weights   09/04/22 1153 09/04/22 2215  Weight: 72.1 kg 74.6 kg    Intake/Output: I/O last 3 completed shifts: In: 4551.1 [P.O.:1078; I.V.:3423.1; IV Piggyback:50] Out: 450 [Urine:450]   Intake/Output this shift:  No intake/output data recorded.  Physical Exam: General: NAD   Head: Normocephalic, atraumatic. Moist oral mucosal membranes  Eyes: Anicteric  Lungs:  Clear to auscultation.normal effort  Heart: Regular rate and rhythm  Abdomen:  Soft, nontender  Extremities:  No peripheral edema.  Neurologic: Nonfocal, moving all four extremities  Skin: No lesions  Access: None     Basic Metabolic Panel: Recent Labs  Lab 09/04/22 1157 09/05/22 1013 09/06/22 0549 09/07/22 0750 09/07/22 1059  NA 142 143 143  --  146*  K 5.6* 4.4 3.5  --  3.0*  CL 96* 93* 93*  --  99  CO2 24 31 34*  --  31  GLUCOSE 101* 97 87  --  121*  BUN 129* 112* 94*  --  69*  CREATININE 17.30* 13.94* 11.64* 8.38* 7.90*  CALCIUM 9.1 8.1* 8.0*  --  8.1*     Liver Function Tests: Recent Labs  Lab 09/04/22 1157 09/05/22 1013 09/06/22 0549  AST 233* 172* 77*  ALT 240* 196* 130*  ALKPHOS 70 60 50  BILITOT 0.6 0.7 0.8  PROT 7.6 6.3* 6.0*  ALBUMIN 3.7 3.3* 3.0*    Recent Labs  Lab 09/04/22 1157  LIPASE 104*    No results for input(s): "AMMONIA" in the last 168 hours.  CBC: Recent Labs  Lab  09/04/22 1157 09/05/22 0440  WBC 9.3 7.9  HGB 11.4* 9.1*  HCT 34.2* 27.4*  MCV 88.4 88.4  PLT 227 163     Cardiac Enzymes: Recent Labs  Lab 09/04/22 1807  CKTOTAL 50     BNP: Invalid input(s): "POCBNP"  CBG: Recent Labs  Lab 09/06/22 1142 09/06/22 1700 09/06/22 2155 09/07/22 0739 09/07/22 1135  GLUCAP 182* 123* 138* 35 150*     Microbiology: Results for orders placed or performed during the hospital encounter of 09/04/22  SARS Coronavirus 2 by RT PCR (hospital order, performed in [place] hospital lab) *cepheid single result test* Anterior Nasal Swab     Status: None   Collection Time: 09/04/22 11:57 AM   Specimen: Anterior Nasal Swab  Result Value Ref Range Status   SARS Coronavirus 2 by RT PCR NEGATIVE NEGATIVE Final    Comment: (NOTE) SARS-CoV-2 target nucleic acids are NOT DETECTED.  The SARS-CoV-2 RNA is generally detectable in upper and lower respiratory specimens during the acute phase of infection. The lowest concentration of SARS-CoV-2 viral copies this assay can detect is 250 copies / mL. A negative result does not preclude SARS-CoV-2 infection and should not be used as the sole basis for treatment or other patient management decisions.  A negative result may occur with improper specimen  collection / handling, submission of specimen other than nasopharyngeal swab, presence of viral mutation(s) within the areas targeted by this assay, and inadequate number of viral copies (<250 copies / mL). A negative result must be combined with clinical observations, patient history, and epidemiological information.  Fact Sheet for Patients:   https://www.patel.info/  Fact Sheet for Healthcare Providers: https://hall.com/  This test is not yet approved or  cleared by the [place] FDA and has been authorized for detection and/or diagnosis of SARS-CoV-2 by FDA under an Emergency Use Authorization (EUA).  This  EUA will remain in effect (meaning this test can be used) for the duration of the COVID-19 declaration under Section 564(b)(1) of the Act, 21 U.S.C. section 360bbb-3(b)(1), unless the authorization is terminated or revoked sooner.  Performed at [facility], [address] [place], [place] [address]   Blood culture (routine x 2)     Status: None (Preliminary result)   Collection Time: 09/04/22  3:07 PM   Specimen: BLOOD  Result Value Ref Range Status   Specimen Description BLOOD BLOOD LEFT ARM  Final   Special Requests   Final    BOTTLES DRAWN AEROBIC AND ANAEROBIC Blood Culture results may not be optimal due to an excessive volume of blood received in culture bottles   Culture   Final    NO GROWTH 2 DAYS Performed at [facility], [address] [place], [place] [address]    Report Status PENDING  Incomplete  Blood culture (routine x 2)     Status: None (Preliminary result)   Collection Time: 09/04/22  4:01 PM   Specimen: BLOOD  Result Value Ref Range Status   Specimen Description BLOOD LEFT ANTECUBITAL  Final   Special Requests   Final    BOTTLES DRAWN AEROBIC AND ANAEROBIC Blood Culture adequate volume   Culture   Final    NO GROWTH 2 DAYS Performed at [facility], [address] [place], [place] [address]    Report Status PENDING  Incomplete  Gastrointestinal Panel by PCR , Stool     Status: None   Collection Time: 09/05/22  1:32 AM   Specimen: Stool  Result Value Ref Range Status   Campylobacter species NOT DETECTED NOT DETECTED Final   Plesimonas shigelloides NOT DETECTED NOT DETECTED Final   Salmonella species NOT DETECTED NOT DETECTED Final   Yersinia enterocolitica NOT DETECTED NOT DETECTED Final   Vibrio species NOT DETECTED NOT DETECTED Final   Vibrio cholerae NOT DETECTED NOT DETECTED Final   Enteroaggregative E coli (EAEC) NOT DETECTED NOT DETECTED Final   Enteropathogenic E coli (EPEC) NOT DETECTED NOT DETECTED Final    Enterotoxigenic E coli (ETEC) NOT DETECTED NOT DETECTED Final   Shiga like toxin producing E coli (STEC) NOT DETECTED NOT DETECTED Final   Shigella/Enteroinvasive E coli (EIEC) NOT DETECTED NOT DETECTED Final   Cryptosporidium NOT DETECTED NOT DETECTED Final   Cyclospora cayetanensis NOT DETECTED NOT DETECTED Final   Entamoeba histolytica NOT DETECTED NOT DETECTED Final   Giardia lamblia NOT DETECTED NOT DETECTED Final   Adenovirus F40/41 NOT DETECTED NOT DETECTED Final   Astrovirus NOT DETECTED NOT DETECTED Final   Norovirus GI/GII NOT DETECTED NOT DETECTED Final   Rotavirus A NOT DETECTED NOT DETECTED Final   Sapovirus (I, II, IV, and V) NOT DETECTED NOT DETECTED Final    Comment: Performed at [facility], [address], [place], [place] [address]  C Difficile Quick Screen w PCR reflex  Status: None   Collection Time: 09/05/22  1:32 AM   Specimen: STOOL  Result Value Ref Range Status   C Diff antigen NEGATIVE NEGATIVE Final   C Diff toxin NEGATIVE NEGATIVE Final   C Diff interpretation No C. difficile detected.  Final    Comment: Performed at [facility], [address] [place], [place] [address]    Coagulation Studies: No results for input(s): "LABPROT", "INR" in the last 72 hours.  Urinalysis: Recent Labs    09/04/22 1601  COLORURINE YELLOW*  LABSPEC 1.011  PHURINE 6.0  GLUCOSEU 150*  HGBUR SMALL*  BILIRUBINUR NEGATIVE  KETONESUR NEGATIVE  PROTEINUR 100*  NITRITE NEGATIVE  LEUKOCYTESUR NEGATIVE       Imaging: No results found.   Medications:    lactated ringers 100 mL/hr at 09/07/22 0543   promethazine (PHENERGAN) injection (IM or IVPB) Stopped (09/05/22 1927)    amoxicillin-clavulanate  1 tablet Oral Q12H   feeding supplement (NEPRO CARB STEADY)  237 mL Oral BID BM   fiber supplement (BANATROL TF)  60 mL Oral BID   heparin  5,000 Units Subcutaneous Q8H   insulin aspart  0-6 Units Subcutaneous TID WC   potassium chloride  40 mEq  Oral Once   acetaminophen, ondansetron **OR** ondansetron (ZOFRAN) IV, oxyCODONE, promethazine (PHENERGAN) injection (IM or IVPB)  Assessment/ Plan:  Barbara Holmes is a 62 y.o.  female  with past medical conditions including hyperlipidemia, type 2 diabetes, hypertension, who was admitted to [facility] on 09/04/2022 for End stage renal disease [N18.6] Acute renal failure, unspecified acute renal failure type [N17.9]   Acute kidney injury on suspected chronic kidney disease. Acute kidney injury likely secondary to prolonged hypovolemia. Renal ultrasound negative for obstruction. No IV contrast exposure.   Renal function improving with IVF. Continue for now. Would recommend 1 -2 days of monitoring before considering discharge. Continue to avoid nephrotoxic agents and therapies.  Patient educated on diabetic nephropathy and will need follow up appt in our office at discharge to monitor renal function.   Lab Results  Component Value Date   CREATININE 7.90 (H) 09/07/2022   CREATININE 8.38 (H) 09/07/2022   CREATININE 11.64 (H) 09/06/2022    Intake/Output Summary (Last 24 hours) at 09/07/2022 1252 Last data filed at 09/07/2022 0517 Gross per 24 hour  Intake 2641.48 ml  Output --  Net 2641.48 ml    2. Anemia of chronic kidney disease Lab Results  Component Value Date   HGB 9.1 (L) 09/05/2022    Hemoglobin at goal.   3. Secondary Hyperparathyroidism: with outpatient labs: None Lab Results  Component Value Date   CALCIUM 8.1 (L) 09/07/2022    Will continue to monitor bone minerals.  4.  Hypertension, essential.  Home regimen includes lisinopril.  Currently held.  Blood pressure 122/51   5. Diabetes mellitus type II with renal manifestations: noninsulin dependent. Home regimen includes Farxiga and metformin. Most recent hemoglobin A1c is 7.6 on 08/14/22.  Metformin held   LOS: 3 [place] 4/4/202412:52 PM

## 2022-09-08 DIAGNOSIS — N186 End stage renal disease: Secondary | ICD-10-CM | POA: Diagnosis not present

## 2022-09-08 LAB — GLUCOSE, CAPILLARY
Glucose-Capillary: 81 mg/dL (ref 70–99)
Glucose-Capillary: 160 mg/dL — ABNORMAL HIGH (ref 70–99)

## 2022-09-08 LAB — BASIC METABOLIC PANEL
Anion gap: 11 (ref 5–15)
BUN: 48 mg/dL — ABNORMAL HIGH (ref 8–23)
CO2: 28 mmol/L (ref 22–32)
Calcium: 7.9 mg/dL — ABNORMAL LOW (ref 8.9–10.3)
Chloride: 106 mmol/L (ref 98–111)
Creatinine, Ser: 5.87 mg/dL — ABNORMAL HIGH (ref 0.44–1.00)
GFR, Estimated: 8 mL/min — ABNORMAL LOW (ref >60)
Glucose, Bld: 91 mg/dL (ref 70–99)
Potassium: 3.2 mmol/L — ABNORMAL LOW (ref 3.5–5.1)
Sodium: 145 mmol/L (ref 135–145)

## 2022-09-08 MED ORDER — NEPRO/CARBSTEADY PO LIQD: 237.0000 mL | Freq: Two times a day (BID) | ORAL | 0 refills | Status: AC

## 2022-09-08 MED ORDER — POTASSIUM CHLORIDE IN NACL 40-0.9 MEQ/L-% IV SOLN
INTRAVENOUS | Status: DC
  Filled 2022-09-08: qty 1000

## 2022-09-08 MED ORDER — POTASSIUM CHLORIDE CRYS ER 20 MEQ PO TBCR
40.0000 meq | EXTENDED_RELEASE_TABLET | Freq: Once | ORAL | Status: AC
  Administered 2022-09-08: 40 meq via ORAL
  Filled 2022-09-08: qty 2

## 2022-09-08 NOTE — Progress Notes (Signed)
Discharge instructions were reviewed with patient. Questions were answered and encouraged. Ivs were taken out. Belongings were collected by patient.

## 2022-09-08 NOTE — Progress Notes (Signed)
[facility]  ROUNDING NOTE   Subjective:   Husband at bedside.   UOP 1350mL Creatinine 5.87 (7.9)  IVF LR infusion  Objective:  Vital signs in last 24 hours:  Temp:  [98.2 F (36.8 C)-99.1 F (37.3 C)] 98.2 F (36.8 C) (04/05 0756) Pulse Rate:  [50-57] 53 (04/05 0756) Resp:  [16-18] 16 (04/05 0756) BP: (133-148)/(50-69) 148/65 (04/05 0756) SpO2:  [91 %-94 %] 91 % (04/05 0756)  Weight change:  Filed Weights   09/04/22 1153 09/04/22 2215  Weight: 72.1 kg 74.6 kg    Intake/Output: I/O last 3 completed shifts: In: 1586.8 [P.O.:240; I.V.:1346.8] Out: 1350 [Urine:1350]   Intake/Output this shift:  Total I/O In: 360 [P.O.:360] Out: -   Physical Exam: General: NAD , walking around the room  Head: Normocephalic, atraumatic. Moist oral mucosal membranes  Eyes: Anicteric  Lungs:  Clear to auscultation.normal effort  Heart: Regular rate and rhythm  Abdomen:  Soft, nontender  Extremities:  No peripheral edema.  Neurologic: Nonfocal, moving all four extremities  Skin: No lesions  Access: None     Basic Metabolic Panel: Recent Labs  Lab 09/04/22 1157 09/05/22 1013 09/06/22 0549 09/07/22 0750 09/07/22 1059 09/08/22 0750  NA 142 143 143  --  146* 145  K 5.6* 4.4 3.5  --  3.0* 3.2*  CL 96* 93* 93*  --  99 106  CO2 24 31 34*  --  31 28  GLUCOSE 101* 97 87  --  121* 91  BUN 129* 112* 94*  --  69* 48*  CREATININE 17.30* 13.94* 11.64* 8.38* 7.90* 5.87*  CALCIUM 9.1 8.1* 8.0*  --  8.1* 7.9*     Liver Function Tests: Recent Labs  Lab 09/04/22 1157 09/05/22 1013 09/06/22 0549  AST 233* 172* 77*  ALT 240* 196* 130*  ALKPHOS 70 60 50  BILITOT 0.6 0.7 0.8  PROT 7.6 6.3* 6.0*  ALBUMIN 3.7 3.3* 3.0*    Recent Labs  Lab 09/04/22 1157  LIPASE 104*    No results for input(s): "AMMONIA" in the last 168 hours.  CBC: Recent Labs  Lab 09/04/22 1157 09/05/22 0440  WBC 9.3 7.9  HGB 11.4* 9.1*  HCT 34.2* 27.4*  MCV 88.4 88.4  PLT 227 163      Cardiac Enzymes: Recent Labs  Lab 09/04/22 1807  CKTOTAL 50     BNP: Invalid input(s): "POCBNP"  CBG: Recent Labs  Lab 09/07/22 0739 09/07/22 1135 09/07/22 1619 09/07/22 2147 09/08/22 0758  GLUCAP 79 150* 97 117* 81     Microbiology: Results for orders placed or performed during the hospital encounter of 09/04/22  SARS Coronavirus 2 by RT PCR (hospital order, performed in [facility] hospital lab) *cepheid single result test* Anterior Nasal Swab     Status: None   Collection Time: 09/04/22 11:57 AM   Specimen: Anterior Nasal Swab  Result Value Ref Range Status   SARS Coronavirus 2 by RT PCR NEGATIVE NEGATIVE Final    Comment: (NOTE) SARS-CoV-2 target nucleic acids are NOT DETECTED.  The SARS-CoV-2 RNA is generally detectable in upper and lower respiratory specimens during the acute phase of infection. The lowest concentration of SARS-CoV-2 viral copies this assay can detect is 250 copies / mL. A negative result does not preclude SARS-CoV-2 infection and should not be used as the sole basis for treatment or other patient management decisions.  A negative result may occur with improper specimen collection / handling, submission of specimen other than nasopharyngeal swab, presence  of viral mutation(s) within the areas targeted by this assay, and inadequate number of viral copies (<250 copies / mL). A negative result must be combined with clinical observations, patient history, and epidemiological information.  Fact Sheet for Patients:   RoadLapTop.co.za  Fact Sheet for Healthcare Providers: http://kim-miller.com/  This test is not yet approved or  cleared by the [place] FDA and has been authorized for detection and/or diagnosis of SARS-CoV-2 by FDA under an Emergency Use Authorization (EUA).  This EUA will remain in effect (meaning this test can be used) for the duration of the COVID-19 declaration under  Section 564(b)(1) of the Act, 21 U.S.C. section 360bbb-3(b)(1), unless the authorization is terminated or revoked sooner.  Performed at [facility], [address] [place], [place] [address]   Blood culture (routine x 2)     Status: None (Preliminary result)   Collection Time: 09/04/22  3:07 PM   Specimen: BLOOD  Result Value Ref Range Status   Specimen Description BLOOD BLOOD LEFT ARM  Final   Special Requests   Final    BOTTLES DRAWN AEROBIC AND ANAEROBIC Blood Culture results may not be optimal due to an excessive volume of blood received in culture bottles   Culture   Final    NO GROWTH 4 DAYS Performed at [facility], [address] [place], [place] [address]    Report Status PENDING  Incomplete  Blood culture (routine x 2)     Status: None (Preliminary result)   Collection Time: 09/04/22  4:01 PM   Specimen: BLOOD  Result Value Ref Range Status   Specimen Description BLOOD LEFT ANTECUBITAL  Final   Special Requests   Final    BOTTLES DRAWN AEROBIC AND ANAEROBIC Blood Culture adequate volume   Culture   Final    NO GROWTH 4 DAYS Performed at [facility], [address] [place], [place] [address]    Report Status PENDING  Incomplete  Gastrointestinal Panel by PCR , Stool     Status: None   Collection Time: 09/05/22  1:32 AM   Specimen: Stool  Result Value Ref Range Status   Campylobacter species NOT DETECTED NOT DETECTED Final   Plesimonas shigelloides NOT DETECTED NOT DETECTED Final   Salmonella species NOT DETECTED NOT DETECTED Final   Yersinia enterocolitica NOT DETECTED NOT DETECTED Final   Vibrio species NOT DETECTED NOT DETECTED Final   Vibrio cholerae NOT DETECTED NOT DETECTED Final   Enteroaggregative E coli (EAEC) NOT DETECTED NOT DETECTED Final   Enteropathogenic E coli (EPEC) NOT DETECTED NOT DETECTED Final   Enterotoxigenic E coli (ETEC) NOT DETECTED NOT DETECTED Final   Shiga like toxin producing E coli (STEC) NOT  DETECTED NOT DETECTED Final   Shigella/Enteroinvasive E coli (EIEC) NOT DETECTED NOT DETECTED Final   Cryptosporidium NOT DETECTED NOT DETECTED Final   Cyclospora cayetanensis NOT DETECTED NOT DETECTED Final   Entamoeba histolytica NOT DETECTED NOT DETECTED Final   Giardia lamblia NOT DETECTED NOT DETECTED Final   Adenovirus F40/41 NOT DETECTED NOT DETECTED Final   Astrovirus NOT DETECTED NOT DETECTED Final   Norovirus GI/GII NOT DETECTED NOT DETECTED Final   Rotavirus A NOT DETECTED NOT DETECTED Final   Sapovirus (I, II, IV, and V) NOT DETECTED NOT DETECTED Final    Comment: Performed at [facility], [address], [place], [place] [address]  C Difficile Quick Screen w PCR reflex     Status: None   Collection Time: 09/05/22  1:32  AM   Specimen: STOOL  Result Value Ref Range Status   C Diff antigen NEGATIVE NEGATIVE Final   C Diff toxin NEGATIVE NEGATIVE Final   C Diff interpretation No C. difficile detected.  Final    Comment: Performed at [facility], [address] [place], [place] [address]    Coagulation Studies: No results for input(s): "LABPROT", "INR" in the last 72 hours.  Urinalysis: No results for input(s): "COLORURINE", "LABSPEC", "PHURINE", "GLUCOSEU", "HGBUR", "BILIRUBINUR", "KETONESUR", "PROTEINUR", "UROBILINOGEN", "NITRITE", "LEUKOCYTESUR" in the last 72 hours.  Invalid input(s): "APPERANCEUR"     Imaging: No results found.   Medications:    promethazine (PHENERGAN) injection (IM or IVPB) Stopped (09/05/22 1927)    amoxicillin-clavulanate  1 tablet Oral Q12H   feeding supplement (NEPRO CARB STEADY)  237 mL Oral BID BM   fiber supplement (BANATROL TF)  60 mL Oral BID   heparin  5,000 Units Subcutaneous Q8H   insulin aspart  0-6 Units Subcutaneous TID WC   potassium chloride  40 mEq Oral Once   acetaminophen, ondansetron **OR** ondansetron (ZOFRAN) IV, oxyCODONE, promethazine (PHENERGAN) injection (IM or IVPB)  Assessment/ Plan:   Barbara Holmes is a 62 y.o.  female  with past medical conditions including hyperlipidemia, type 2 diabetes, hypertension, who was admitted to [facility] on 09/04/2022 for End stage renal disease [N18.6] Acute renal failure, unspecified acute renal failure type [N17.9]   Acute kidney injury on suspected chronic kidney disease. Acute kidney injury likely secondary to prolonged hypovolemia. Renal ultrasound negative for obstruction. No IV contrast exposure.   Renal function improving with IVF.   Encourage PO intake  Patient educated on diabetic nephropathy and will need follow up with nephrology.    Lab Results  Component Value Date   CREATININE 5.87 (H) 09/08/2022   CREATININE 7.90 (H) 09/07/2022   CREATININE 8.38 (H) 09/07/2022    Intake/Output Summary (Last 24 hours) at 09/08/2022 1140 Last data filed at 09/08/2022 1018 Gross per 24 hour  Intake 360 ml  Output 1350 ml  Net -990 ml    2. Anemia of chronic kidney disease Lab Results  Component Value Date   HGB 9.1 (L) 09/05/2022    Hemoglobin low.  3. Secondary Hyperparathyroidism: with outpatient labs: Corrected calcium at goal.  Lab Results  Component Value Date   CALCIUM 7.9 (L) 09/08/2022    Will continue to monitor bone minerals  4.  Hypertension, essential 148/65.  Currently holding home regimen includes lisinopril.  Continue to hold until follow up with nephrology.    5. Diabetes mellitus type II with renal manifestations: noninsulin dependent. Holding home regimen of dapagliflozin  and metformin. Most recent hemoglobin A1c is 7.6 on 08/14/22.    LOS: 4 Opie Maclaughlin 4/5/202411:40 AM

## 2022-09-08 NOTE — Discharge Summary (Signed)
Physician Discharge Summary   Patient: Barbara Holmes MRN: [id number] DOB: [birth year]  Admit date:     09/04/2022  Discharge date: 09/08/22  Discharge Physician: Enedina FinnerSona Terald Jump   PCP: Ronnald RampSimmons-Robinson, Makiera, MD   Recommendations at discharge:   follow-up PCP and one week follow-up Dr. Wynelle LinkKolluru for acute renal failure and lab work next week keep log of sugars at home. Hold metformin and Marcelline DeistFarxiga for now. Hold lisinopril to kidney numbers improve.. Your blood pressure is stable. Discuss with PCP  Discharge Diagnoses: Active Problems:   AKI (acute kidney injury)   Essential (primary) hypertension   Diabetes mellitus, type 2   Colitis   Transaminitis   Barbara Lagermma Gayle Shelburne is a 62 y.o. female with medical history significant of type 2 diabetes, hyperlipidemia, hypertension presenting with severe AKI, colitis, transaminitis.  Patient reports generalized malaise, recurrent nausea vomiting diarrhea over the past week or so.    CT of the abdomen with descending colon thickening concerning for nonspecific colitis.  Came in with creat of 17.0 (baseline creat 0.96)   AKI (acute kidney injury) appears due to severe dehydration from G.I. losses. --Patient with new onset severe renal failure with creatinine of 17.3--13.0--11.0--8.0--7.9--5.9 --baseline creatinine around 1.0 --Noted concurrent colitis with decreased p.o. intake --Also with home ACE inhibitor use --No significant acute renal/ureteral pathology noted on CT A&P -- nephrology consultation with Dr. Wynelle LinkKolluru. Continue IV hydration. Creatinine trending down. -- Renal ultrasound negative for obstruction --good UOP. Patient advised to continue hydration. -- Patient will follow-up with Dr. Wynelle LinkKolluru as outpatient for repeat labs. -- Holding lisinopril, metformin,farxiga till creat back to baseline--pt aware   Transaminitis AST 233, ALT 240 ?  Ischemic injury in the setting of colitis and AKI -- acute hepatitis panel-- negative --CT  abdomen and pelvis negative for any hepatobiliary disease -- levels trending down -- consider ultrasound abdomen if patient is symptomatic currently no symptoms    Acute colitis --Positive nausea and abdominal pain x 1 week with noted nonspecific colitis on CT  --Will place on empiric Rocephin and azithromycin for infectious coverage--wbc normal--change to po augmentin--completed --C. difficile negative --diarrhea resolved   Diabetes mellitus, type 2, hyperglycemia SSI for now till po intake improves --hold po home meds till creat better. Diet control for now   Essential (primary) hypertension --BP stable  -Hold nephrotoxic agents   Discharge to home.     Family communication :husband Consults : nephrology CODE STATUS: full DVT Prophylaxis : heparin     Consultants: nephrology Disposition: Home Diet recommendation:  Discharge Diet Orders (From admission, onward)     Start     Ordered   09/08/22 0000  Diet - low sodium heart healthy        09/08/22 1046   09/08/22 0000  Diet Carb Modified        09/08/22 1046           Cardiac and Carb modified diet DISCHARGE MEDICATION: Allergies as of 09/08/2022   No Known Allergies      Medication List     STOP taking these medications    benzonatate 100 MG capsule Commonly known as: TESSALON   Farxiga 5 MG Tabs tablet Generic drug: dapagliflozin propanediol   lisinopril 20 MG tablet Commonly known as: ZESTRIL   meloxicam 15 MG tablet Commonly known as: MOBIC   metFORMIN 500 MG 24 hr tablet Commonly known as: GLUCOPHAGE-XR   tirzepatide 5 MG/0.5ML Pen Commonly known as: MOUNJARO       TAKE these  medications    albuterol 108 (90 Base) MCG/ACT inhaler Commonly known as: VENTOLIN HFA Ventolin HFA 90 mcg/actuation aerosol inhaler  INHALE 2 PUFFS BY MOUTH EVERY 4 HOURS AS NEEDED FOR COUGH OR WHEEZING   celecoxib 200 MG capsule Commonly known as: CELEBREX TAKE 1 CAPSULE (200 MG TOTAL) BY MOUTH AT BEDTIME  AS NEEDED.   Cholecalciferol 25 MCG (1000 UT) capsule Take 1,000 Units by mouth daily.   feeding supplement (NEPRO CARB STEADY) Liqd Take 237 mLs by mouth 2 (two) times daily between meals.   lansoprazole 30 MG capsule Commonly known as: PREVACID TAKE 1 CAPSULE (30 MG TOTAL) BY MOUTH DAILY AS NEEDED.   meclizine 25 MG tablet Commonly known as: ANTIVERT meclizine 25 mg tablet  TAKE 1 TABLET BY MOUTH TWICE A DAY AS NEEDED   ondansetron 4 MG tablet Commonly known as: ZOFRAN Take 4 mg by mouth every 8 (eight) hours as needed for vomiting or nausea.   rosuvastatin 40 MG tablet Commonly known as: CRESTOR Take 1 tablet (40 mg total) by mouth daily.   traMADol 50 MG tablet Commonly known as: ULTRAM Take 1-2 tablets (50-100 mg total) by mouth at bedtime as needed. TAKE 1-2 TABLETS (50-100mg ) BY MOUTH EVERY DAY AT BEDTIME        Follow-up Information     Simmons-Robinson, Makiera, MD. Schedule an appointment as soon as possible for a visit in 1 week(s).   Specialty: Family Medicine Why: hospital f/u Contact information: [address] [place] [address] [phone]         Lamont Dowdy, MD Follow up.   Specialty: Nephrology Why: for AKI and labs as out pt Contact information: [address] [employer] [address] [place] [address] [phone]                 Filed Weights   09/04/22 1153 09/04/22 2215  Weight: 72.1 kg 74.6 kg     Condition at discharge: fair  The results of significant diagnostics from this hospitalization (including imaging, microbiology, ancillary and laboratory) are listed below for reference.   Imaging Studies: DG Chest Port 1 View  Result Date: 09/04/2022 CLINICAL DATA:  Nausea and vomiting, vertigo EXAM: PORTABLE CHEST 1 VIEW COMPARISON:  05/17/2012 FINDINGS: Single frontal view of the chest demonstrates mild enlargement of the cardiac silhouette, which may be accentuated by portable AP technique. No acute  airspace disease, effusion, or pneumothorax. No acute bony abnormalities. IMPRESSION: 1. Mild enlargement the cardiac silhouette, likely projectional. 2. No acute airspace disease. Electronically Signed   By: Sharlet Salina M.D.   On: 09/04/2022 17:46   US RENAL  Result Date: 09/04/2022 CLINICAL DATA:  Acute renal insufficiency EXAM: RENAL / URINARY TRACT ULTRASOUND COMPLETE COMPARISON:  09/04/2022 FINDINGS: Right Kidney: Renal measurements: 11.9 x 6.3 x 6.4 cm = volume: 253.6 mL. Echogenicity within normal limits. No mass or hydronephrosis visualized. Left Kidney: Renal measurements: 11.1 x 6.5 x 5.8 cm = volume: 220.8 mL. Echogenicity within normal limits. No mass or hydronephrosis visualized. Bladder: Appears normal for degree of bladder distention. Other: None. IMPRESSION: 1. Unremarkable renal ultrasound. Electronically Signed   By: Sharlet Salina M.D.   On: 09/04/2022 17:21   CT ABDOMEN PELVIS WO CONTRAST  Result Date: 09/04/2022 CLINICAL DATA:  Abdominal pain EXAM: CT ABDOMEN AND PELVIS WITHOUT CONTRAST TECHNIQUE: Multidetector CT imaging of the abdomen and pelvis was performed following the standard protocol without IV contrast. RADIATION DOSE REDUCTION: This exam was performed according to the departmental dose-optimization  program which includes automated exposure control, adjustment of the mA and/or kV according to patient size and/or use of iterative reconstruction technique. COMPARISON:  None Available. FINDINGS: Lower chest: Linear dependent subsegmental atelectasis or scarring and no pleural or pericardial effusion. Small hiatal hernia. Hepatobiliary: No focal liver abnormality is seen. No gallstones, gallbladder wall thickening, or biliary dilatation. Pancreas: Unremarkable. No pancreatic ductal dilatation or surrounding inflammatory changes. Spleen: Normal in size without focal abnormality. Adrenals/Urinary Tract: Adrenal glands are unremarkable. Kidneys are normal, without renal calculi,  focal lesion, or hydronephrosis. Bladder is unremarkable. Stomach/Bowel: Stomach is within normal limits. Appendix appears normal. There is thickening of the wall of the descending colon and mild pericolonic fat stranding suggestive of nonspecific colitis. No abscess or obstruction. Vascular/Lymphatic: Aortic atherosclerosis. No enlarged abdominal or pelvic lymph nodes. Reproductive: Status post hysterectomy. No adnexal masses. Other: No abdominal wall hernia or abnormality. No abdominopelvic ascites. Musculoskeletal: No acute or significant osseous findings. IMPRESSION: Descending colon thickening and pericolonic inflammatory changes consistent with nonspecific colitis. Electronically Signed   By: Layla Maw M.D.   On: 09/04/2022 14:24    Microbiology: Results for orders placed or performed during the hospital encounter of 09/04/22  SARS Coronavirus 2 by RT PCR (hospital order, performed in [facility] hospital lab) *cepheid single result test* Anterior Nasal Swab     Status: None   Collection Time: 09/04/22 11:57 AM   Specimen: Anterior Nasal Swab  Result Value Ref Range Status   SARS Coronavirus 2 by RT PCR NEGATIVE NEGATIVE Final    Comment: (NOTE) SARS-CoV-2 target nucleic acids are NOT DETECTED.  The SARS-CoV-2 RNA is generally detectable in upper and lower respiratory specimens during the acute phase of infection. The lowest concentration of SARS-CoV-2 viral copies this assay can detect is 250 copies / mL. A negative result does not preclude SARS-CoV-2 infection and should not be used as the sole basis for treatment or other patient management decisions.  A negative result may occur with improper specimen collection / handling, submission of specimen other than nasopharyngeal swab, presence of viral mutation(s) within the areas targeted by this assay, and inadequate number of viral copies (<250 copies / mL). A negative result must be combined with clinical observations, patient  history, and epidemiological information.  Fact Sheet for Patients:   RoadLapTop.co.za  Fact Sheet for Healthcare Providers: http://kim-miller.com/  This test is not yet approved or  cleared by the [place] FDA and has been authorized for detection and/or diagnosis of SARS-CoV-2 by FDA under an Emergency Use Authorization (EUA).  This EUA will remain in effect (meaning this test can be used) for the duration of the COVID-19 declaration under Section 564(b)(1) of the Act, 21 U.S.C. section 360bbb-3(b)(1), unless the authorization is terminated or revoked sooner.  Performed at [facility], [address] [place], [place] [address]   Blood culture (routine x 2)     Status: None (Preliminary result)   Collection Time: 09/04/22  3:07 PM   Specimen: BLOOD  Result Value Ref Range Status   Specimen Description BLOOD BLOOD LEFT ARM  Final   Special Requests   Final    BOTTLES DRAWN AEROBIC AND ANAEROBIC Blood Culture results may not be optimal due to an excessive volume of blood received in culture bottles   Culture   Final    NO GROWTH 4 DAYS Performed at [facility], [address], [place], [place] [address]    Report Status PENDING  Incomplete  Blood culture (routine  x 2)     Status: None (Preliminary result)   Collection Time: 09/04/22  4:01 PM   Specimen: BLOOD  Result Value Ref Range Status   Specimen Description BLOOD LEFT ANTECUBITAL  Final   Special Requests   Final    BOTTLES DRAWN AEROBIC AND ANAEROBIC Blood Culture adequate volume   Culture   Final    NO GROWTH 4 DAYS Performed at [facility], [address] [place], [place] [address]    Report Status PENDING  Incomplete  Gastrointestinal Panel by PCR , Stool     Status: None   Collection Time: 09/05/22  1:32 AM   Specimen: Stool  Result Value Ref Range Status   Campylobacter species NOT DETECTED NOT DETECTED Final   Plesimonas  shigelloides NOT DETECTED NOT DETECTED Final   Salmonella species NOT DETECTED NOT DETECTED Final   Yersinia enterocolitica NOT DETECTED NOT DETECTED Final   Vibrio species NOT DETECTED NOT DETECTED Final   Vibrio cholerae NOT DETECTED NOT DETECTED Final   Enteroaggregative E coli (EAEC) NOT DETECTED NOT DETECTED Final   Enteropathogenic E coli (EPEC) NOT DETECTED NOT DETECTED Final   Enterotoxigenic E coli (ETEC) NOT DETECTED NOT DETECTED Final   Shiga like toxin producing E coli (STEC) NOT DETECTED NOT DETECTED Final   Shigella/Enteroinvasive E coli (EIEC) NOT DETECTED NOT DETECTED Final   Cryptosporidium NOT DETECTED NOT DETECTED Final   Cyclospora cayetanensis NOT DETECTED NOT DETECTED Final   Entamoeba histolytica NOT DETECTED NOT DETECTED Final   Giardia lamblia NOT DETECTED NOT DETECTED Final   Adenovirus F40/41 NOT DETECTED NOT DETECTED Final   Astrovirus NOT DETECTED NOT DETECTED Final   Norovirus GI/GII NOT DETECTED NOT DETECTED Final   Rotavirus A NOT DETECTED NOT DETECTED Final   Sapovirus (I, II, IV, and V) NOT DETECTED NOT DETECTED Final    Comment: Performed at [facility], [address] [place], [place] [address]  C Difficile Quick Screen w PCR reflex     Status: None   Collection Time: 09/05/22  1:32 AM   Specimen: STOOL  Result Value Ref Range Status   C Diff antigen NEGATIVE NEGATIVE Final   C Diff toxin NEGATIVE NEGATIVE Final   C Diff interpretation No C. difficile detected.  Final    Comment: Performed at [facility], [address] [place], [place] [address]    Labs: CBC: Recent Labs  Lab 09/04/22 1157 09/05/22 0440  WBC 9.3 7.9  HGB 11.4* 9.1*  HCT 34.2* 27.4*  MCV 88.4 88.4  PLT 227 163   Basic Metabolic Panel: Recent Labs  Lab 09/04/22 1157 09/05/22 1013 09/06/22 0549 09/07/22 0750 09/07/22 1059 09/08/22 0750  NA 142 143 143  --  146* 145  K 5.6* 4.4 3.5  --  3.0* 3.2*  CL 96* 93* 93*  --  99 106  CO2 24 31 34*   --  31 28  GLUCOSE 101* 97 87  --  121* 91  BUN 129* 112* 94*  --  69* 48*  CREATININE 17.30* 13.94* 11.64* 8.38* 7.90* 5.87*  CALCIUM 9.1 8.1* 8.0*  --  8.1* 7.9*   Liver Function Tests: Recent Labs  Lab 09/04/22 1157 09/05/22 1013 09/06/22 0549  AST 233* 172* 77*  ALT 240* 196* 130*  ALKPHOS 70 60 50  BILITOT 0.6 0.7 0.8  PROT 7.6 6.3* 6.0*  ALBUMIN 3.7 3.3* 3.0*   CBG: Recent Labs  Lab 09/07/22 0739 09/07/22 1135 09/07/22 1619 09/07/22 2147  09/08/22 0758  GLUCAP 79 150* 97 117* 81    Discharge time spent: greater than 30 minutes.  Signed: Enedina Finner, MD [facility] 09/08/2022

## 2022-09-09 LAB — CULTURE, BLOOD (ROUTINE X 2)
Culture: NO GROWTH
Culture: NO GROWTH
Special Requests: ADEQUATE

## 2022-09-11 ENCOUNTER — Telehealth: Payer: Self-pay | Admitting: *Deleted

## 2022-09-11 NOTE — Transitions of Care (Post Inpatient/ED Visit) (Signed)
   09/11/2022  Name: Barbara Holmes MRN: [id number] DOB: 1960-06-27  Today's TOC FU Call Status: Today's TOC FU Call Status:: Successful TOC FU Call Competed TOC FU Call Complete Date: 09/11/22  Transition Care Management Follow-up Telephone Call Date of Discharge: 09/08/22 Discharge Facility: [facility]) Type of Discharge: Inpatient Admission Primary Inpatient Discharge Diagnosis:: End stage renal disease How have you been since you were released from the hospital?: Better Any questions or concerns?: No  Items Reviewed: Did you receive and understand the discharge instructions provided?: Yes Medications obtained and verified?: Yes (Medications Reviewed) Any new allergies since your discharge?: No Dietary orders reviewed?: No Do you have support at home?: Yes People in Home: spouse Name of Support/Comfort Primary Source: [facility] and Equipment/Supplies: Were Home Health Services Ordered?: No Any new equipment or medical supplies ordered?: No  Functional Questionnaire: Do you need assistance with bathing/showering or dressing?: No Do you need assistance with meal preparation?: No Do you need assistance with eating?: No Do you have difficulty maintaining continence: No Do you need assistance with getting out of bed/getting out of a chair/moving?: No Do you have difficulty managing or taking your medications?: No  Follow up appointments reviewed: PCP Follow-up appointment confirmed?: Yes Date of PCP follow-up appointment?: 09/19/22 Follow-up Provider: Nicki Holmes -[facility] Follow-up appointment confirmed?: Yes Date of Specialist follow-up appointment?: 09/21/22 Follow-Up Specialty Provider:: Dr Barbara Link Do you need transportation to your follow-up appointment?: No Do you understand care options if your condition(s) worsen?: Yes-patient verbalized understanding  SDOH Interventions Today    Flowsheet Row Most Recent Value  SDOH  Interventions   Food Insecurity Interventions Intervention Not Indicated  Housing Interventions Intervention Not Indicated  Transportation Interventions Intervention Not Indicated      Interventions Today    Flowsheet Row Most Recent Value  General Interventions   General Interventions Discussed/Reviewed General Interventions Discussed, General Interventions Reviewed, Doctor Visits, Referral to Nurse  Barbara Holmes to [facility] Coordinator]  Doctor Visits Discussed/Reviewed Specialist  PCP/Specialist Visits Compliance with follow-up visit  Nutrition Interventions   Nutrition Discussed/Reviewed Nutrition Discussed, Nutrition Reviewed, Supplmental nutrition  [Paient started on nepro]      Patient consented to [employer] lane to outreach her.  Appointment set for [id number] 2:30 PM    Barbara Holmes BSN RN [facility] [phone]

## 2022-09-12 DIAGNOSIS — N179 Acute kidney failure, unspecified: Secondary | ICD-10-CM | POA: Diagnosis not present

## 2022-09-13 ENCOUNTER — Telehealth: Payer: Self-pay

## 2022-09-13 DIAGNOSIS — Z09 Encounter for follow-up examination after completed treatment for conditions other than malignant neoplasm: Secondary | ICD-10-CM | POA: Insufficient documentation

## 2022-09-13 NOTE — Telephone Encounter (Signed)
Patient has been scheduled in 9:00 AM spot for 09/14/22. Please call to confirm if she can attend this appt.   Ronnald Ramp, MD  [facility]

## 2022-09-13 NOTE — Telephone Encounter (Signed)
Called patient and she confirmed that she can come to the appointment on 09/14/2022 at 9am.

## 2022-09-13 NOTE — Telephone Encounter (Signed)
Copied from CRM [id number]. Topic: General - Inquiry >> Sep 13, 2022  2:47 PM Clide Dales wrote: Patient states that she needs a release form to return to work on 4/15, but her hospital FU isn't until the 16th. Can patient be worked in this week or bring the paper by to be completed? Please advise.

## 2022-09-13 NOTE — Progress Notes (Unsigned)
I,Barbara Holmes,acting as a scribe for [employer], MD.,have documented all relevant documentation on the behalf of Barbara Ramp, MD,as directed by  Barbara Ramp, MD while in the presence of Barbara Ramp, MD.   Established patient visit   Patient: Barbara Holmes   DOB: 10/26/1960   62 y.o. Female  MRN: [id number] Visit Date: 09/14/2022  Today's healthcare provider: Ronnald Ramp, MD   Chief Complaint  Patient presents with   Hospitalization Follow-up   Subjective    HPI  Hospital Follow up, Fall   Barbara Holmes is a 62 y.o. female with hx notable for HTN and T2DM presenting for hospital follow up for AKI, colitis and transaminitis. Hx obtained partially from patient and partially from chart review:   Patient was admitted/evaluated in the ED 09/04/22  and discharged on 09/08/22 after diagnosis and treatment for colitis along with severe AKI with creatinine elevated at 17 (baseline was normal and 0.95 when last checked in may of 2023), CT showed thickening of the colon, she also had elevated AST 233 and ALT 240. BUN was elevated at 129. She was treated with Flagyl and CTX empirically.   Disposition was to home   Notable events & treatment during hospitalization include:  -she was treated with flagyl and CTX and eventually switched to augmentin  -she states that she also was started on nephro shake   Medication Changes at discharge:  -patient was advised to hold metformin, farxiga & lisinopril and Mounjaro  Other Follow Up since hospital D/C:  Scheduled for follow up with nephrology on 09/21/22   Medications: Outpatient Medications Prior to Visit  Medication Sig   albuterol (VENTOLIN HFA) 108 (90 Base) MCG/ACT inhaler Ventolin HFA 90 mcg/actuation aerosol inhaler  INHALE 2 PUFFS BY MOUTH EVERY 4 HOURS AS NEEDED FOR COUGH OR WHEEZING   celecoxib (CELEBREX) 200 MG capsule TAKE 1 CAPSULE (200 MG TOTAL) BY MOUTH  AT BEDTIME AS NEEDED.   Cholecalciferol 1000 units capsule Take 1,000 Units by mouth daily.    lansoprazole (PREVACID) 30 MG capsule TAKE 1 CAPSULE (30 MG TOTAL) BY MOUTH DAILY AS NEEDED.   meclizine (ANTIVERT) 25 MG tablet meclizine 25 mg tablet  TAKE 1 TABLET BY MOUTH TWICE A DAY AS NEEDED   Nutritional Supplements (FEEDING SUPPLEMENT, NEPRO CARB STEADY,) LIQD Take 237 mLs by mouth 2 (two) times daily between meals.   ondansetron (ZOFRAN) 4 MG tablet Take 4 mg by mouth every 8 (eight) hours as needed for vomiting or nausea.   rosuvastatin (CRESTOR) 40 MG tablet Take 1 tablet (40 mg total) by mouth daily.   traMADol (ULTRAM) 50 MG tablet Take 1-2 tablets (50-100 mg total) by mouth at bedtime as needed. TAKE 1-2 TABLETS (50-100mg ) BY MOUTH EVERY DAY AT BEDTIME   No facility-administered medications prior to visit.    Review of Systems     Objective    BP 133/81 (BP Location: Left Arm, Patient Position: Sitting, Cuff Size: Large)   Pulse (!) 54   Temp 98.6 F (37 C) (Oral)   Resp 16   Ht 4\' 11"  (1.499 m)   Wt 160 lb 6.4 oz (72.8 kg)   BMI 32.40 kg/m    Physical Exam Vitals reviewed.  Constitutional:      General: She is not in acute distress.    Appearance: Normal appearance. She is not ill-appearing, toxic-appearing or diaphoretic.  Eyes:     Conjunctiva/sclera: Conjunctivae normal.  Neck:     Vascular: No  carotid bruit.  Cardiovascular:     Rate and Rhythm: Normal rate and regular rhythm.     Pulses: Normal pulses.     Heart sounds: Normal heart sounds. No murmur heard.    No friction rub. No gallop.  Pulmonary:     Effort: Pulmonary effort is normal. No respiratory distress.     Breath sounds: Normal breath sounds. No stridor. No wheezing, rhonchi or rales.  Abdominal:     General: Bowel sounds are normal. There is no distension.     Palpations: Abdomen is soft.     Tenderness: There is no abdominal tenderness.     Comments: Ecchymosis on left and right lower  quadrants 2/2 to DVT ppx injections  Musculoskeletal:     Right lower leg: No edema.     Left lower leg: No edema.  Skin:    Findings: No erythema.     Comments: Small, macular, circular areas of unroofed blisters on calf of RLE, healing, no active bleeding,   Neurological:     Mental Status: She is alert and oriented to person, place, and time.      No results found for any visits on 09/14/22.  Assessment & Plan     Problem List Items Addressed This Visit       Cardiovascular and Mediastinum   Essential (primary) hypertension    Chronic  Within normal limits today Advised patient to continue holding lisinopril until nephrology appt  We will touch base in early may if she is recommended to continue holding the ACEi  Advised patient to monitor BP at home BP is stable and meds are restarted by nephrology, patient can cancel early May appt and follow up as previously scheduled on 11/01/22 for her physical        Genitourinary   AKI (acute kidney injury) - Primary    Reviewed labs from hospital, creatine down trended consistently to 5.81 from 17  She will continue to hold the mounjaro and farxiga for now  We will reassess with updated labs  Reports that she had labs drawn on 09/12/22 but I am unable to review these values today  Since she recently had blood drawn and is improved, we will not check labs today  Will recheck creatinine at next follow up appt          Other   Hospital discharge follow-up    Patient presents for hospital follow up  Reports feeling improved, no urinary symptoms or hematuria She has been tolerating oral fluids/foods  Patient has nephrology follow up scheduled  Completed form for patient to return to work without restrictions on 09/18/22        Return in about 3 weeks (around 10/05/2022) for HTN,DM meds.        The entirety of the information documented in the History of Present Illness, Review of Systems and Physical Exam were personally  obtained by me. Portions of this information were initially documented by Hetty Ely, CMA . I, Barbara Ramp, MD have reviewed the documentation above for thoroughness and accuracy.      Barbara Ramp, MD  [facility] [phone] (phone) [phone] (fax)  [facility]

## 2022-09-14 ENCOUNTER — Ambulatory Visit (INDEPENDENT_AMBULATORY_CARE_PROVIDER_SITE_OTHER): Payer: BC Managed Care – PPO | Admitting: Family Medicine

## 2022-09-14 ENCOUNTER — Encounter: Payer: Self-pay | Admitting: Family Medicine

## 2022-09-14 VITALS — BP 133/81 | HR 54 | Temp 98.6°F | Resp 16 | Ht 59.0 in | Wt 160.4 lb

## 2022-09-14 DIAGNOSIS — I10 Essential (primary) hypertension: Secondary | ICD-10-CM

## 2022-09-14 DIAGNOSIS — Z09 Encounter for follow-up examination after completed treatment for conditions other than malignant neoplasm: Secondary | ICD-10-CM

## 2022-09-14 DIAGNOSIS — N179 Acute kidney failure, unspecified: Secondary | ICD-10-CM

## 2022-09-14 NOTE — Assessment & Plan Note (Signed)
Reviewed labs from hospital, creatine down trended consistently to 5.81 from 17  She will continue to hold the mounjaro and farxiga for now  We will reassess with updated labs  Reports that she had labs drawn on 09/12/22 but I am unable to review these values today  Since she recently had blood drawn and is improved, we will not check labs today  Will recheck creatinine at next follow up appt

## 2022-09-14 NOTE — Assessment & Plan Note (Signed)
Chronic  Within normal limits today Advised patient to continue holding lisinopril until nephrology appt  We will touch base in early may if she is recommended to continue holding the ACEi  Advised patient to monitor BP at home BP is stable and meds are restarted by nephrology, patient can cancel early May appt and follow up as previously scheduled on 11/01/22 for her physical

## 2022-09-14 NOTE — Patient Instructions (Signed)
Please continue to hold your metformin, farxiga, mounjaro and lisinopril until your visit with the nephrologist on 09/21/22.   Please follow up with me as planned for May.

## 2022-09-14 NOTE — Assessment & Plan Note (Signed)
Patient presents for hospital follow up  Reports feeling improved, no urinary symptoms or hematuria She has been tolerating oral fluids/foods  Patient has nephrology follow up scheduled  Completed form for patient to return to work without restrictions on 09/18/22

## 2022-09-19 ENCOUNTER — Inpatient Hospital Stay: Payer: BC Managed Care – PPO | Admitting: Family Medicine

## 2022-09-21 DIAGNOSIS — N179 Acute kidney failure, unspecified: Secondary | ICD-10-CM | POA: Diagnosis not present

## 2022-09-21 DIAGNOSIS — D631 Anemia in chronic kidney disease: Secondary | ICD-10-CM | POA: Insufficient documentation

## 2022-09-21 DIAGNOSIS — I10 Essential (primary) hypertension: Secondary | ICD-10-CM | POA: Diagnosis not present

## 2022-09-21 DIAGNOSIS — E1129 Type 2 diabetes mellitus with other diabetic kidney complication: Secondary | ICD-10-CM | POA: Diagnosis not present

## 2022-09-22 ENCOUNTER — Ambulatory Visit: Payer: Self-pay | Admitting: *Deleted

## 2022-09-22 ENCOUNTER — Encounter: Payer: Self-pay | Admitting: *Deleted

## 2022-09-22 NOTE — Patient Outreach (Signed)
  Care Coordination   Initial Visit Note   09/22/2022 Name: Cashe Gatt MRN: [id number] DOB: [birth year]  Kamille Toomey is a 62 y.o. year old female who sees Simmons-Robinson, Tawanna Cooler, MD for primary care. I spoke with  Patsy Lager by phone today.  What matters to the patients health and wellness today?  Keep DM controlled in effort to control CKD.    Goals Addressed             This Visit's Progress    Effective management of DM       Care Coordination Interventions: Provided education to patient about basic DM disease process Reviewed medications with patient and discussed importance of medication adherence Counseled on importance of regular laboratory monitoring as prescribed Discussed plans with patient for ongoing care management follow up and provided patient with direct contact information for care management team Provided patient with written educational materials related to hypo and hyperglycemia and importance of correct treatment Screening for signs and symptoms of depression related to chronic disease state  Assessed social determinant of health barriers         SDOH assessments and interventions completed:  Yes    SDOH Interventions    Flowsheet Row Telephone from 09/11/2022 in [facility] Coord[facility] Office Visit from 10/26/2021 in [facility]  SDOH Interventions    Food Insecurity Interventions Intervention Not Indicated --  Housing Interventions Intervention Not Indicated --  Transportation Interventions Intervention Not Indicated --  Depression Interventions/Treatment  -- PHQ2-9 Score <4 Follow-up Not Indicated        Care Coordination Interventions:  Yes, provided   Interventions Today    Flowsheet Row Most Recent Value  Chronic Disease   Chronic disease during today's visit Diabetes, Chronic Kidney Disease/End Stage Renal Disease (ESRD)  General Interventions   General  Interventions Discussed/Reviewed General Interventions Reviewed, Doctor Visits, Labs  Labs Hgb A1c every 3 months  [A1C in March is 7.6]  Doctor Visits Discussed/Reviewed Doctor Visits Reviewed, PCP, Specialist  [Nephrology seen on yesterday, PCP scheduled for 5/2]  PCP/Specialist Visits Compliance with follow-up visit  Education Interventions   Education Provided Provided Education  Provided Verbal Education On Nutrition, Blood Sugar Monitoring, Medication, When to see the doctor, Eye Care  [Blood sugar range 85-100]       Follow up plan: Follow up call scheduled for 5/22    Encounter Outcome:  Pt. Visit Completed   Kemper Durie, RN, MSN, [facility] Care Management Care Management Coordinator [phone]

## 2022-09-22 NOTE — Patient Outreach (Signed)
  Care Coordination   09/22/2022 Name: Barbara Holmes MRN: [id number] DOB: March 08, 1961   Care Coordination Outreach Attempts:  An unsuccessful telephone outreach was attempted for a scheduled appointment today.  Follow Up Plan:  Additional outreach attempts will be made to offer the patient care coordination information and services.   Encounter Outcome:  No Answer   Care Coordination Interventions:  No, not indicated    Kemper Durie, RN, MSN, [facility] Care Management Care Management Coordinator [phone]

## 2022-09-24 ENCOUNTER — Other Ambulatory Visit: Payer: Self-pay | Admitting: Family Medicine

## 2022-09-24 DIAGNOSIS — G2581 Restless legs syndrome: Secondary | ICD-10-CM

## 2022-09-24 DIAGNOSIS — E782 Mixed hyperlipidemia: Secondary | ICD-10-CM

## 2022-10-02 ENCOUNTER — Telehealth: Payer: Self-pay | Admitting: Family Medicine

## 2022-10-04 NOTE — Progress Notes (Signed)
I,Sulibeya S Dimas,acting as a Neurosurgeon for [employer], MD.,have documented all relevant documentation on the behalf of Ronnald Ramp, MD,as directed by  Ronnald Ramp, MD while in the presence of Ronnald Ramp, MD.   Established patient visit   Patient: Barbara Holmes   DOB: 1960/10/23   62 y.o. Female  MRN: [id number] Visit Date: 10/05/2022  Today's healthcare provider: Ronnald Ramp, MD   Chief Complaint  Patient presents with   Diabetes   Hypertension   Subjective    HPI  Hypertension, follow-up  BP Readings from Last 3 Encounters:  10/05/22 104/68  09/14/22 133/81  09/08/22 (!) 148/65   Wt Readings from Last 3 Encounters:  10/05/22 156 lb 3.2 oz (70.9 kg)  09/14/22 160 lb 6.4 oz (72.8 kg)  09/04/22 164 lb 8 oz (74.6 kg)     She was last seen for hypertension 3 weeks ago.  BP at that visit was 133/81. Management since that visit includes continue holding lisinopril until nephrology appt.   She reports excellent compliance with treatment.  Patient reports nephrologist restarted lisinopril 20 mg daily.  He also advised the patient to hold metformin and mounjaro for now with the plan for a progressive restart of medications for her DM management.  She is not having side effects.   Outside blood pressures are checking every "now and then." She reports BP 128/70-80.   Pertinent labs Lab Results  Component Value Date   CHOL 115 10/26/2021   HDL 40 10/26/2021   LDLCALC 46 10/26/2021   TRIG 176 (H) 10/26/2021   CHOLHDL 2.9 10/26/2021   Lab Results  Component Value Date   NA 140 10/05/2022   K 4.3 10/05/2022   CREATININE 1.02 (H) 10/05/2022   GFRNONAA 8 (L) 09/08/2022   GLUCOSE 220 (H) 10/05/2022   TSH 0.938 10/26/2021     The ASCVD Risk score (Arnett DK, et al., 2019) failed to calculate for the following reasons:   The valid total cholesterol range is 130 to 320  mg/dL  --------------------------------------------------------------------------------------------------- Diabetes Mellitus Type II, Follow-up  Lab Results  Component Value Date   HGBA1C 7.6 (A) 08/14/2022   HGBA1C 7.9 (A) 05/03/2022   HGBA1C 7.1 (H) 10/26/2021   Wt Readings from Last 3 Encounters:  10/05/22 156 lb 3.2 oz (70.9 kg)  09/14/22 160 lb 6.4 oz (72.8 kg)  09/04/22 164 lb 8 oz (74.6 kg)   Last seen for diabetes 2 months ago.  Management since then includes resubmit rx for mounjaro. She reports  advised by nephrology to hold metformin and mounjaro  . She is not currently taking any medication for diabetes at this time   Reports that last Thursday, she was having some discomfort in her back and has been drinking water, denies pain today ,reports she has been drinking water mostly   Home blood sugar records: fasting range: 118, 121, 141  Episodes of hypoglycemia? No    Current insulin regiment: none Most Recent Eye Exam: needs to schedule  Pertinent Labs: Lab Results  Component Value Date   CHOL 115 10/26/2021   HDL 40 10/26/2021   LDLCALC 46 10/26/2021   TRIG 176 (H) 10/26/2021   CHOLHDL 2.9 10/26/2021   Lab Results  Component Value Date   NA 140 10/05/2022   K 4.3 10/05/2022   CREATININE 1.02 (H) 10/05/2022   GFRNONAA 8 (L) 09/08/2022   LABMICR 1,068.0 05/31/2022   MICRALBCREAT 347 (H) 05/31/2022     ---------------------------------------------------------------------------------------------------   Medications: Outpatient Medications  Prior to Visit  Medication Sig   albuterol (VENTOLIN HFA) 108 (90 Base) MCG/ACT inhaler Ventolin HFA 90 mcg/actuation aerosol inhaler  INHALE 2 PUFFS BY MOUTH EVERY 4 HOURS AS NEEDED FOR COUGH OR WHEEZING   Cholecalciferol 1000 units capsule Take 1,000 Units by mouth daily.    lansoprazole (PREVACID) 30 MG capsule TAKE 1 CAPSULE (30 MG TOTAL) BY MOUTH DAILY AS NEEDED.   lisinopril (ZESTRIL) 20 MG tablet Take 20 mg by  mouth daily.   meclizine (ANTIVERT) 25 MG tablet meclizine 25 mg tablet  TAKE 1 TABLET BY MOUTH TWICE A DAY AS NEEDED   Nutritional Supplements (FEEDING SUPPLEMENT, NEPRO CARB STEADY,) LIQD Take 237 mLs by mouth 2 (two) times daily between meals.   ondansetron (ZOFRAN) 4 MG tablet Take 4 mg by mouth every 8 (eight) hours as needed for vomiting or nausea.   rosuvastatin (CRESTOR) 40 MG tablet TAKE 1 TABLET BY MOUTH EVERY DAY   traMADol (ULTRAM) 50 MG tablet TAKE 1-2 TABLETS (50-100 MG TOTAL) BY MOUTH AT BEDTIME AS NEEDED.   [DISCONTINUED] celecoxib (CELEBREX) 200 MG capsule TAKE 1 CAPSULE (200 MG TOTAL) BY MOUTH AT BEDTIME AS NEEDED.   No facility-administered medications prior to visit.    Review of Systems  Constitutional:  Negative for appetite change.  Eyes:  Negative for visual disturbance.  Respiratory:  Negative for cough, chest tightness and shortness of breath.   Cardiovascular:  Negative for chest pain and leg swelling.  Gastrointestinal:  Negative for abdominal pain, nausea and vomiting.  Neurological:  Negative for dizziness, light-headedness and headaches.       Objective    BP 104/68 (BP Location: Left Arm, Patient Position: Sitting, Cuff Size: Large)   Pulse 96   Temp 97.8 F (36.6 C) (Temporal)   Resp 12   Wt 156 lb 3.2 oz (70.9 kg)   SpO2 98%   BMI 31.55 kg/m  BP Readings from Last 3 Encounters:  10/05/22 104/68  09/14/22 133/81  09/08/22 (!) 148/65   Wt Readings from Last 3 Encounters:  10/05/22 156 lb 3.2 oz (70.9 kg)  09/14/22 160 lb 6.4 oz (72.8 kg)  09/04/22 164 lb 8 oz (74.6 kg)      Physical Exam Vitals reviewed.  Constitutional:      General: She is not in acute distress.    Appearance: Normal appearance. She is not ill-appearing, toxic-appearing or diaphoretic.  Cardiovascular:     Rate and Rhythm: Normal rate and regular rhythm.     Pulses: Normal pulses.     Heart sounds: Normal heart sounds. No murmur heard.    No friction rub. No  gallop.  Pulmonary:     Effort: Pulmonary effort is normal. No respiratory distress.     Breath sounds: Normal breath sounds. No stridor. No wheezing, rhonchi or rales.  Abdominal:     General: Bowel sounds are normal. There is no distension.     Palpations: Abdomen is soft.     Tenderness: There is no abdominal tenderness.  Musculoskeletal:     Right lower leg: No edema.     Left lower leg: No edema.  Neurological:     Mental Status: She is alert and oriented to person, place, and time.       Results for orders placed or performed in visit on 10/05/22  Basic Metabolic Panel (BMET)  Result Value Ref Range   Glucose 220 (H) 70 - 99 mg/dL   BUN 27 8 - 27 mg/dL   Creatinine,  Ser 1.02 (H) 0.57 - 1.00 mg/dL   eGFR 62 >16 XW/RUE/4.54   BUN/Creatinine Ratio 26 12 - 28   Sodium 140 134 - 144 mmol/L   Potassium 4.3 3.5 - 5.2 mmol/L   Chloride 100 96 - 106 mmol/L   CO2 23 20 - 29 mmol/L   Calcium 9.7 8.7 - 10.3 mg/dL  Urinalysis, microscopic only  Result Value Ref Range   WBC, UA None seen 0 - 5 /hpf   RBC, Urine None seen 0 - 2 /hpf   Epithelial Cells (non renal) >10 (A) 0 - 10 /hpf   Casts Present (A) None seen /lpf   Cast Type Hyaline casts N/A   Crystals Present (A) N/A   Crystal Type Uric Acid N/A   Bacteria, UA Few None seen/Few    Assessment & Plan     Problem List Items Addressed This Visit       Cardiovascular and Mediastinum   Essential (primary) hypertension - Primary    Controlled BP at goal Continue current medications at current doses No medications changes today        Relevant Medications   lisinopril (ZESTRIL) 20 MG tablet     Endocrine   Diabetes mellitus, type 2 (HCC)    DM well controlled on current therapies  Continue current medications  Discussed dietary recommendations for DM management         Relevant Medications   lisinopril (ZESTRIL) 20 MG tablet     Genitourinary   AKI (acute kidney injury) (HCC)    Recheck CMP today  Will  also check U/A       Relevant Orders   Basic Metabolic Panel (BMET) (Completed)   Urinalysis, microscopic only (Completed)     No follow-ups on file.        The entirety of the information documented in the History of Present Illness, Review of Systems and Physical Exam were personally obtained by me. Portions of this information were initially documented by [facility] . I, Ronnald Ramp, MD have reviewed the documentation above for thoroughness and accuracy.      Ronnald Ramp, MD  [facility] [phone] (phone) [phone] (fax)  [facility]

## 2022-10-05 ENCOUNTER — Encounter: Payer: Self-pay | Admitting: Family Medicine

## 2022-10-05 ENCOUNTER — Ambulatory Visit: Payer: BC Managed Care – PPO | Admitting: Family Medicine

## 2022-10-05 VITALS — BP 104/68 | HR 96 | Temp 97.8°F | Resp 12 | Wt 156.2 lb

## 2022-10-05 DIAGNOSIS — N179 Acute kidney failure, unspecified: Secondary | ICD-10-CM | POA: Diagnosis not present

## 2022-10-05 DIAGNOSIS — I10 Essential (primary) hypertension: Secondary | ICD-10-CM | POA: Diagnosis not present

## 2022-10-05 DIAGNOSIS — E119 Type 2 diabetes mellitus without complications: Secondary | ICD-10-CM | POA: Diagnosis not present

## 2022-10-05 NOTE — Patient Instructions (Signed)
It was a pleasure to see you today!  Thank you for choosing [facility] for your primary care.   Barbara Holmes was seen for diabetes and blood pressure.   Our plans for today were: Please continue to monitor your blood pressure while taking the lisinopril 20mg . If your pressure is consistently below 100/60, please call so that we can adjust your medication  We will collect both urine and blood studies today. We will follow up with you once the results are available.     You should return to our clinic as scheduled in June for your physical   Best Wishes,   Dr. Roxan Hockey

## 2022-10-06 LAB — URINALYSIS, MICROSCOPIC ONLY
Epithelial Cells (non renal): >10 /hpf — AB (ref 0–10)
RBC, Urine: NONE SEEN /hpf (ref 0–2)
WBC, UA: NONE SEEN /hpf (ref 0–5)

## 2022-10-06 LAB — BASIC METABOLIC PANEL
BUN/Creatinine Ratio: 26 (ref 12–28)
BUN: 27 mg/dL (ref 8–27)
CO2: 23 mmol/L (ref 20–29)
Calcium: 9.7 mg/dL (ref 8.7–10.3)
Chloride: 100 mmol/L (ref 96–106)
Creatinine, Ser: 1.02 mg/dL — ABNORMAL HIGH (ref 0.57–1.00)
Glucose: 220 mg/dL — ABNORMAL HIGH (ref 70–99)
Potassium: 4.3 mmol/L (ref 3.5–5.2)
Sodium: 140 mmol/L (ref 134–144)
eGFR: 62 mL/min/{1.73_m2} (ref >59)

## 2022-10-07 NOTE — Assessment & Plan Note (Signed)
Controlled BP at goal Continue current medications at current doses No medications changes today   

## 2022-10-07 NOTE — Assessment & Plan Note (Signed)
DM well controlled on current therapies  Continue current medications  Discussed dietary recommendations for DM management

## 2022-10-07 NOTE — Assessment & Plan Note (Signed)
Recheck CMP today  Will also check U/A

## 2022-10-25 ENCOUNTER — Ambulatory Visit: Payer: Self-pay | Admitting: *Deleted

## 2022-10-25 NOTE — Patient Outreach (Signed)
  Care Coordination   10/25/2022 Name: Barbara Holmes MRN: [id number] DOB: 1960-11-06   Care Coordination Outreach Attempts:  An unsuccessful telephone outreach was attempted for a scheduled appointment today.  Follow Up Plan:  Additional outreach attempts will be made to offer the patient care coordination information and services.   Encounter Outcome:  No Answer   Care Coordination Interventions:  No, not indicated    Kemper Durie, RN, MSN, [facility] Care Management Care Management Coordinator [phone]

## 2022-11-01 ENCOUNTER — Encounter: Payer: BC Managed Care – PPO | Admitting: Family Medicine

## 2022-11-06 NOTE — Progress Notes (Unsigned)
I,Barbara Holmes,acting as a Neurosurgeon for [employer], MD.,have documented all relevant documentation on the behalf of Barbara Ramp, MD,as directed by  Barbara Ramp, MD while in the presence of Barbara Ramp, MD.   Complete physical exam   Patient: Barbara Holmes   DOB: 12-02-1960   62 y.o. Female  MRN: [id number] Visit Date: 11/07/2022  Today's healthcare provider: Ronnald Ramp, MD   No chief complaint on file.  Subjective    Barbara Holmes is a 62 y.o. female who presents today for a complete physical exam.  She reports consuming a {diet types:17450} diet. {Exercise:19826} She generally feels {well/fairly well/poorly:18703}. She reports sleeping {well/fairly well/poorly:18703}. She {does/does not:200015} have additional problems to discuss today.  HPI  ***  Past Medical History:  Diagnosis Date   Diabetes mellitus without complication (HCC)    Hyperlipidemia    Hypertension    Restless leg syndrome    Past Surgical History:  Procedure Laterality Date   ABDOMINAL HYSTERECTOMY     total   BREAST EXCISIONAL BIOPSY Right 1999   benign   COLONOSCOPY N/A 11/26/2020   Procedure: COLONOSCOPY;  Surgeon: Barbara Mood, MD;  Location: [facility] ENDOSCOPY;  Service: Gastroenterology;  Laterality: N/A;   FOOT SURGERY     LAPAROSCOPY     endometriosis   Social History   Socioeconomic History   Marital status: Divorced    Spouse name: Not on file   Number of children: Not on file   Years of education: Not on file   Highest education level: GED or equivalent  Occupational History   Not on file  Tobacco Use   Smoking status: Former    Years: 3    Types: Cigarettes    Quit date: 06/05/1988    Years since quitting: 34.4   Smokeless tobacco: Never   Tobacco comments:    1 cigarette per week  Vaping Use   Vaping Use: Never used  Substance and Sexual Activity   Alcohol use: No    Alcohol/week: 0.0 standard drinks of  alcohol   Drug use: No   Sexual activity: Not on file  Other Topics Concern   Not on file  Social History Narrative   Not on file   Social Determinants of Health   Financial Resource Strain: Medium Risk (10/02/2022)   Overall Financial Resource Strain (CARDIA)    Difficulty of Paying Living Expenses: Somewhat hard  Food Insecurity: No Food Insecurity (10/02/2022)   Hunger Vital Sign    Worried About Running Out of Food in the Last Year: Never true    Ran Out of Food in the Last Year: Never true  Transportation Needs: No Transportation Needs (10/02/2022)   PRAPARE - Administrator, Civil Service (Medical): No    Lack of Transportation (Non-Medical): No  Physical Activity: Insufficiently Active (10/02/2022)   Exercise Vital Sign    Days of Exercise per Week: 2 days    Minutes of Exercise per Session: 30 min  Stress: No Stress Concern Present (10/02/2022)   [employer] of Occupational Health - Occupational Stress Questionnaire    Feeling of Stress : Only a little  Social Connections: Moderately Isolated (10/02/2022)   Social Connection and Isolation Panel [NHANES]    Frequency of Communication with Friends and Family: Three times a week    Frequency of Social Gatherings with Friends and Family: Once a week    Attends Religious Services: More than 4 times per year    Active Member  of Clubs or Organizations: No    Attends Banker Meetings: Not on file    Marital Status: Divorced  Intimate Partner Violence: Not At Risk (09/04/2022)   Humiliation, Afraid, Rape, and Kick questionnaire    Fear of Current or Ex-Partner: No    Emotionally Abused: No    Physically Abused: No    Sexually Abused: No   Family Status  Relation Name Status   Mother  Alive   Father  Deceased   Sister  Alive   Brother  Deceased   MGM  Deceased   Mat Aunt  (Not Specified)   Family History  Problem Relation Age of Onset   Hypertension Mother    Hyperlipidemia Mother     Diabetes Mother    Dementia Mother    Hypertension Father    Hyperlipidemia Father    Hypertension Sister    Hyperlipidemia Sister    Diabetes Sister    Diabetes Maternal Grandmother    Breast cancer Maternal Aunt 43   No Known Allergies  Patient Care Team: Simmons-Robinson, Tawanna Cooler, MD as PCP - General (Family Medicine) Barbara Durie, RN as [facility] Care Management   Medications: Outpatient Medications Prior to Visit  Medication Sig   albuterol (VENTOLIN HFA) 108 (90 Base) MCG/ACT inhaler Ventolin HFA 90 mcg/actuation aerosol inhaler  INHALE 2 PUFFS BY MOUTH EVERY 4 HOURS AS NEEDED FOR COUGH OR WHEEZING   Cholecalciferol 1000 units capsule Take 1,000 Units by mouth daily.    lansoprazole (PREVACID) 30 MG capsule TAKE 1 CAPSULE (30 MG TOTAL) BY MOUTH DAILY AS NEEDED.   lisinopril (ZESTRIL) 20 MG tablet Take 20 mg by mouth daily.   meclizine (ANTIVERT) 25 MG tablet meclizine 25 mg tablet  TAKE 1 TABLET BY MOUTH TWICE A DAY AS NEEDED   Nutritional Supplements (FEEDING SUPPLEMENT, NEPRO CARB STEADY,) LIQD Take 237 mLs by mouth 2 (two) times daily between meals.   ondansetron (ZOFRAN) 4 MG tablet Take 4 mg by mouth every 8 (eight) hours as needed for vomiting or nausea.   rosuvastatin (CRESTOR) 40 MG tablet TAKE 1 TABLET BY MOUTH EVERY DAY   traMADol (ULTRAM) 50 MG tablet TAKE 1-2 TABLETS (50-100 MG TOTAL) BY MOUTH AT BEDTIME AS NEEDED.   No facility-administered medications prior to visit.    Review of Systems  {Labs  Heme  Chem  Endocrine  Serology  Results Review (optional):23779}  Objective    There were no vitals taken for this visit. {Show previous vital signs (optional):23777}   Physical Exam  ***  Last depression screening scores    10/05/2022    3:37 PM 09/14/2022    8:56 AM 08/14/2022    3:28 PM  PHQ 2/9 Scores  PHQ - 2 Score 0 0 0  PHQ- 9 Score 1     Last fall risk screening    10/05/2022    3:38 PM  Fall Risk   Falls in the past year?  0  Number falls in past yr: 0  Injury with Fall? 0  Risk for fall due to : No Fall Risks  Follow up Falls evaluation completed   Last Audit-C alcohol use screening    10/05/2022    3:38 PM  Alcohol Use Disorder Test (AUDIT)  1. How often do you have a drink containing alcohol? 0  2. How many drinks containing alcohol do you have on a typical day when you are drinking? 0  3. How often do you have six or more  drinks on one occasion? 0  AUDIT-C Score 0   A score of 3 or more in women, and 4 or more in men indicates increased risk for alcohol abuse, EXCEPT if all of the points are from question 1   No results found for any visits on 11/07/22.  Assessment & Plan    Routine Health Maintenance and Physical Exam  Exercise Activities and Dietary recommendations  Goals      Effective management of DM     Care Coordination Interventions: Provided education to patient about basic DM disease process Reviewed medications with patient and discussed importance of medication adherence Counseled on importance of regular laboratory monitoring as prescribed Discussed plans with patient for ongoing care management follow up and provided patient with direct contact information for care management team Provided patient with written educational materials related to hypo and hyperglycemia and importance of correct treatment Screening for signs and symptoms of depression related to chronic disease state  Assessed social determinant of health barriers         Immunization History  Administered Date(s) Administered   Influenza Whole 03/14/2017   Influenza, Quadrivalent, Recombinant, Inj, Pf 03/08/2019   Influenza,inj,Quad PF,6+ Mos 04/03/2018, 04/26/2021, 05/03/2022   Influenza-Unspecified 03/11/2015, 01/31/2020   Moderna Covid-19 Vaccine Bivalent Booster 61yrs & up 08/02/2020   Moderna Sars-Covid-2 Vaccination 12/30/2019, 01/27/2020   Pneumococcal Polysaccharide-23 04/03/2016   Td 04/13/2003    Tdap 08/01/2012   Zoster Recombinat (Shingrix) 04/26/2021, 10/26/2021    Health Maintenance  Topic Date Due   PAP SMEAR-Modifier  09/26/2020   OPHTHALMOLOGY EXAM  11/25/2021   COVID-19 Vaccine (4 - 2023-24 season) 02/03/2022   DTaP/Tdap/Td (3 - Td or Tdap) 08/01/2022   INFLUENZA VACCINE  01/04/2023   HEMOGLOBIN A1C  02/14/2023   FOOT EXAM  06/01/2023   MAMMOGRAM  02/22/2024   Colonoscopy  11/27/2030   Hepatitis C Screening  Completed   HIV Screening  Completed   Zoster Vaccines- Shingrix  Completed   HPV VACCINES  Aged Out    Discussed health benefits of physical activity, and encouraged her to engage in regular exercise appropriate for her age and condition.  ***  No follow-ups on file.     {provider attestation***:1}   Barbara Ramp, MD  [facility] [phone] (phone) [phone] (fax)  [facility]

## 2022-11-07 ENCOUNTER — Ambulatory Visit (INDEPENDENT_AMBULATORY_CARE_PROVIDER_SITE_OTHER): Payer: BC Managed Care – PPO | Admitting: Family Medicine

## 2022-11-07 ENCOUNTER — Encounter: Payer: Self-pay | Admitting: Family Medicine

## 2022-11-07 VITALS — BP 110/72 | HR 66 | Ht 59.0 in | Wt 160.5 lb

## 2022-11-07 DIAGNOSIS — Z124 Encounter for screening for malignant neoplasm of cervix: Secondary | ICD-10-CM | POA: Diagnosis not present

## 2022-11-07 DIAGNOSIS — E782 Mixed hyperlipidemia: Secondary | ICD-10-CM

## 2022-11-07 DIAGNOSIS — E1165 Type 2 diabetes mellitus with hyperglycemia: Secondary | ICD-10-CM | POA: Diagnosis not present

## 2022-11-07 DIAGNOSIS — Z23 Encounter for immunization: Secondary | ICD-10-CM | POA: Diagnosis not present

## 2022-11-07 DIAGNOSIS — Z0000 Encounter for general adult medical examination without abnormal findings: Secondary | ICD-10-CM

## 2022-11-07 DIAGNOSIS — G959 Disease of spinal cord, unspecified: Secondary | ICD-10-CM

## 2022-11-07 DIAGNOSIS — Z6836 Body mass index (BMI) 36.0-36.9, adult: Secondary | ICD-10-CM

## 2022-11-07 MED ORDER — LANSOPRAZOLE 30 MG PO CPDR: 30.0000 mg | DELAYED_RELEASE_CAPSULE | Freq: Every day | ORAL | 1 refills | Status: AC | PRN

## 2022-11-07 NOTE — Assessment & Plan Note (Addendum)
Chronic conditions are stable  Patient was counseled on benefits of regular physical activity with goal of 150 minutes of moderate to vigurous intensity 4 days per week  Patient was counseled to consume well balanced diet of fruits, vegetables, limited saturated fats and limited sugary foods and beverages with emphasis on consuming 6-8 glasses of water daily  Screening recommended today: diabetes eye exam, she no longer needs pap smears,  Vaccines recommended today: tetanus vaccine administered today

## 2022-11-07 NOTE — Assessment & Plan Note (Signed)
Chronic  Lipid panel collected today  Continue crestor 40mg  daily

## 2022-11-07 NOTE — Assessment & Plan Note (Signed)
Chronic  Counseled on importance of balanced diet, water intake and regular physical activity  A1c and lipid panel collected

## 2022-11-07 NOTE — Patient Instructions (Signed)
Today we completed your annual preventative exam.  Items recommended to fulfill care gaps include: 1.  Pap smear 2. Diabetes eye exam 3.  Updated COVID-vaccine 4.  Updated tetanus vaccine  Health Maintenance, Female Adopting a healthy lifestyle and getting preventive care are important in promoting health and wellness. Ask your health care provider about: The right schedule for you to have regular tests and exams. Things you can do on your own to prevent diseases and keep yourself healthy. What should I know about diet, weight, and exercise? Eat a healthy diet  Eat a diet that includes plenty of vegetables, fruits, low-fat dairy products, and lean protein. Do not eat a lot of foods that are high in solid fats, added sugars, or sodium. Maintain a healthy weight Body mass index (BMI) is used to identify weight problems. It estimates body fat based on height and weight. Your health care provider can help determine your BMI and help you achieve or maintain a healthy weight. Get regular exercise Get regular exercise. This is one of the most important things you can do for your health. Most adults should: Exercise for at least 150 minutes each week. The exercise should increase your heart rate and make you sweat (moderate-intensity exercise). Do strengthening exercises at least twice a week. This is in addition to the moderate-intensity exercise. Spend less time sitting. Even light physical activity can be beneficial. Watch cholesterol and blood lipids Have your blood tested for lipids and cholesterol at 64 years of age, then have this test every 5 years. Have your cholesterol levels checked more often if: Your lipid or cholesterol levels are high. You are older than 64 years of age. You are at high risk for heart disease. What should I know about cancer screening? Depending on your health history and family history, you may need to have cancer screening at various ages. This may include  screening for: Breast cancer. Cervical cancer. Colorectal cancer. Skin cancer. Lung cancer. What should I know about heart disease, diabetes, and high blood pressure? Blood pressure and heart disease High blood pressure causes heart disease and increases the risk of stroke. This is more likely to develop in people who have high blood pressure readings or are overweight. Have your blood pressure checked: Every 3-5 years if you are 30-79 years of age. Every year if you are 78 years old or older. Diabetes Have regular diabetes screenings. This checks your fasting blood sugar level. Have the screening done: Once every three years after age 42 if you are at a normal weight and have a low risk for diabetes. More often and at a younger age if you are overweight or have a high risk for diabetes. What should I know about preventing infection? Hepatitis B If you have a higher risk for hepatitis B, you should be screened for this virus. Talk with your health care provider to find out if you are at risk for hepatitis B infection. Hepatitis C Testing is recommended for: Everyone born from [birth year] through 1967. Anyone with known risk factors for hepatitis C. Sexually transmitted infections (STIs) Get screened for STIs, including gonorrhea and chlamydia, if: You are sexually active and are younger than 64 years of age. You are older than 64 years of age and your health care provider tells you that you are at risk for this type of infection. Your sexual activity has changed since you were last screened, and you are at increased risk for chlamydia or gonorrhea. Ask your health care  provider if you are at risk. Ask your health care provider about whether you are at high risk for HIV. Your health care provider may recommend a prescription medicine to help prevent HIV infection. If you choose to take medicine to prevent HIV, you should first get tested for HIV. You should then be tested every 3 months for as  long as you are taking the medicine. Pregnancy If you are about to stop having your period (premenopausal) and you may become pregnant, seek counseling before you get pregnant. Take 400 to 800 micrograms (mcg) of folic acid every day if you become pregnant. Ask for birth control (contraception) if you want to prevent pregnancy. Osteoporosis and menopause Osteoporosis is a disease in which the bones lose minerals and strength with aging. This can result in bone fractures. If you are 2 years old or older, or if you are at risk for osteoporosis and fractures, ask your health care provider if you should: Be screened for bone loss. Take a calcium or vitamin D supplement to lower your risk of fractures. Be given hormone replacement therapy (HRT) to treat symptoms of menopause. Follow these instructions at home: Alcohol use Do not drink alcohol if: Your health care provider tells you not to drink. You are pregnant, may be pregnant, or are planning to become pregnant. If you drink alcohol: Limit how much you have to: 0-1 drink a day. Know how much alcohol is in your drink. In the [place] one drink equals one 12 oz bottle of beer (355 mL), one 5 oz glass of wine (148 mL), or one 1 oz glass of hard liquor (44 mL). Lifestyle Do not use any products that contain nicotine or tobacco. These products include cigarettes, chewing tobacco, and vaping devices, such as e-cigarettes. If you need help quitting, ask your health care provider. Do not use street drugs. Do not share needles. Ask your health care provider for help if you need support or information about quitting drugs. General instructions Schedule regular health, dental, and eye exams. Stay current with your vaccines. Tell your health care provider if: You often feel depressed. You have ever been abused or do not feel safe at home. Summary Adopting a healthy lifestyle and getting preventive care are important in promoting health and  wellness. Follow your health care provider's instructions about healthy diet, exercising, and getting tested or screened for diseases. Follow your health care provider's instructions on monitoring your cholesterol and blood pressure. This information is not intended to replace advice given to you by your health care provider. Make sure you discuss any questions you have with your health care provider. Document Revised: 10/11/2020 Document Reviewed: 10/11/2020 Elsevier Patient Education  2024 [employer].

## 2022-11-08 LAB — LIPID PANEL
Chol/HDL Ratio: 2.7 ratio (ref 0.0–4.4)
Cholesterol, Total: 123 mg/dL (ref 100–199)
HDL: 46 mg/dL (ref >39)
LDL Chol Calc (NIH): 57 mg/dL (ref 0–99)
Triglycerides: 111 mg/dL (ref 0–149)
VLDL Cholesterol Cal: 20 mg/dL (ref 5–40)

## 2022-11-08 LAB — HEMOGLOBIN A1C
Est. average glucose Bld gHb Est-mCnc: 157 mg/dL
Hgb A1c MFr Bld: 7.1 % — ABNORMAL HIGH (ref 4.8–5.6)

## 2022-11-10 ENCOUNTER — Ambulatory Visit: Payer: Self-pay | Admitting: *Deleted

## 2022-11-10 NOTE — Patient Outreach (Signed)
  Care Coordination   11/10/2022 Name: Barbara Holmes MRN: [id number] DOB: February 26, 1961   Care Coordination Outreach Attempts:  An unsuccessful telephone outreach was attempted for a scheduled appointment today.  Follow Up Plan:  Additional outreach attempts will be made to offer the patient care coordination information and services.   Encounter Outcome:  No Answer   Care Coordination Interventions:  No, not indicated    Kemper Durie, RN, MSN, [facility] Care Management Care Management Coordinator [phone]

## 2022-11-14 DIAGNOSIS — I10 Essential (primary) hypertension: Secondary | ICD-10-CM | POA: Diagnosis not present

## 2022-11-14 DIAGNOSIS — E1129 Type 2 diabetes mellitus with other diabetic kidney complication: Secondary | ICD-10-CM | POA: Diagnosis not present

## 2022-11-14 DIAGNOSIS — D631 Anemia in chronic kidney disease: Secondary | ICD-10-CM | POA: Diagnosis not present

## 2022-11-14 DIAGNOSIS — N179 Acute kidney failure, unspecified: Secondary | ICD-10-CM | POA: Diagnosis not present

## 2022-11-22 ENCOUNTER — Other Ambulatory Visit: Payer: Self-pay | Admitting: Family Medicine

## 2022-11-22 DIAGNOSIS — E1129 Type 2 diabetes mellitus with other diabetic kidney complication: Secondary | ICD-10-CM | POA: Diagnosis not present

## 2022-11-22 DIAGNOSIS — I10 Essential (primary) hypertension: Secondary | ICD-10-CM | POA: Diagnosis not present

## 2022-11-22 DIAGNOSIS — R809 Proteinuria, unspecified: Secondary | ICD-10-CM | POA: Diagnosis not present

## 2022-11-22 DIAGNOSIS — G2581 Restless legs syndrome: Secondary | ICD-10-CM

## 2022-11-23 NOTE — Telephone Encounter (Signed)
Requested medication (s) are due for refill today: yes  Requested medication (s) are on the active medication list: yes  Last refill:  09/25/22  Future visit scheduled: yes  Notes to clinic:  Unable to refill per protocol, cannot delegate.      Requested Prescriptions  Pending Prescriptions Disp Refills   traMADol (ULTRAM) 50 MG tablet [Pharmacy Med Name: TRAMADOL HCL 50 MG TABLET] 60 tablet 0    Sig: TAKE 1-2 TABLETS (50-100 MG TOTAL) BY MOUTH AT BEDTIME AS NEEDED.     Not Delegated - Analgesics:  Opioid Agonists Failed - 11/22/2022  9:19 PM      Failed - This refill cannot be delegated      Failed - Urine Drug Screen completed in last 360 days      Passed - Valid encounter within last 3 months    Recent Outpatient Visits           2 weeks ago Annual physical exam   [facility] Simmons-Robinson, [place], MD   1 month ago Essential (primary) hypertension   [facility] Simmons-Robinson, [place], MD   2 months ago AKI (acute kidney injury) (HCC)   [facility] Simmons-Robinson, Tawanna Cooler, MD   3 months ago Essential (primary) hypertension   [facility] Simmons-Robinson, [place], MD   5 months ago Type 2 diabetes mellitus without complication, without long-term current use of insulin (HCC)   [facility] Simmons-Robinson, Tawanna Cooler, MD       Future Appointments             In 3 weeks Simmons-Robinson, Tawanna Cooler, MD [facility]

## 2022-12-19 ENCOUNTER — Ambulatory Visit: Payer: Self-pay | Admitting: Family Medicine

## 2022-12-19 ENCOUNTER — Encounter: Payer: Self-pay | Admitting: Family Medicine

## 2022-12-19 VITALS — BP 103/72 | HR 65 | Temp 98.0°F | Resp 12 | Ht 59.0 in | Wt 163.9 lb

## 2022-12-19 DIAGNOSIS — I10 Essential (primary) hypertension: Secondary | ICD-10-CM

## 2022-12-19 DIAGNOSIS — E782 Mixed hyperlipidemia: Secondary | ICD-10-CM

## 2022-12-19 DIAGNOSIS — E119 Type 2 diabetes mellitus without complications: Secondary | ICD-10-CM

## 2022-12-19 DIAGNOSIS — G959 Disease of spinal cord, unspecified: Secondary | ICD-10-CM

## 2022-12-19 DIAGNOSIS — K2101 Gastro-esophageal reflux disease with esophagitis, with bleeding: Secondary | ICD-10-CM

## 2022-12-19 NOTE — Assessment & Plan Note (Signed)
A1c 7.1 last month, slightly above goal of <7. Currently on Metformin 1000mg  daily. Discussed the challenges with insurance coverage for [place] and potential alternatives. Chronic, not yet at goal -Continue Metformin 1000mg  daily. -Consider adding Glipizide if affordable and necessary to achieve glycemic control. -Check A1c in 6 months.

## 2022-12-19 NOTE — Assessment & Plan Note (Signed)
On Crestor 40mg  for cholesterol management. -chronic -Continue Crestor 40mg  daily.

## 2022-12-19 NOTE — Assessment & Plan Note (Signed)
Reports back pain, particularly when on feet for extended periods. -chronic -PRN meds control pain  -Continue Tramadol 50mg  as needed at bedtime.

## 2022-12-19 NOTE — Assessment & Plan Note (Signed)
On Prevacid 30mg . -chronic,well controlled on medication  -Continue Prevacid 30mg  daily.

## 2022-12-19 NOTE — Progress Notes (Signed)
Established patient visit   Patient: Barbara Holmes   DOB: 05-Nov-1960   62 y.o. Female  MRN: [id number] Visit Date: 12/19/2022  Today's healthcare provider: Ronnald Ramp, MD   Chief Complaint  Patient presents with   Medical Management of Chronic Issues   Subjective     HPI   Patient here to fu on DM. Patient was restarted on metformin 500 mg two tablets at breakfast, by Barbara Holmes. Patient reports he wanted to re-start [place]. However, patient recently lost her insurance due to being let go from her job of 40 years.  Last edited by Barbara Holmes, CMA on 12/19/2022  3:47 PM.      Discussed the use of AI scribe software for clinical note transcription with the patient, who gave verbal consent to proceed.  History of Present Illness   The patient, diagnosed with diabetes, hypertension, and kidney disease, presents for a follow-up consultation. She reports a recent job loss, resulting in the termination of their health insurance. Consequently, she has resumed taking metformin (1000mg  once daily) as a more affordable alternative to [place]. The patient also takes lisinopril (20mg ) for hypertension, Crestor (40mg ) for cholesterol, tramadol (50mg  as needed) for pain, meclizine (25mg  twice daily as needed) for dizziness, and Prevacid (30mg ) for reflux.  The patient's creatinine levels were reported as satisfactory (0.89) in June, indicating good kidney function recovery. Her blood pressure was also within normal range (103/72). However, the patient's A1c level was slightly elevated at 7.1, just above the goal of less than 7.  The patient's blood glucose levels have been relatively stable, with fasting readings ranging from 118 to 152. She attributes the higher readings to occasional indulgence in ice cream. The patient has been trying to manage her diet to control her blood sugar levels.  The patient also reports occasional back pain, particularly when standing  for extended periods. This is a concern as she has recently started a new job that requires her to be on her feet for most of the day.  The patient is awaiting the start of her new job's health insurance coverage, which may take 60 to 90 days. She is considering transferring her prescriptions to [facility], where she may receive an employee discount. The patient is also considering using [facility] to help manage medication costs in the interim.       Medications: Outpatient Medications Prior to Visit  Medication Sig   albuterol (VENTOLIN HFA) 108 (90 Base) MCG/ACT inhaler Ventolin HFA 90 mcg/actuation aerosol inhaler  INHALE 2 PUFFS BY MOUTH EVERY 4 HOURS AS NEEDED FOR COUGH OR WHEEZING   Cholecalciferol 1000 units capsule Take 1,000 Units by mouth daily.    lansoprazole (PREVACID) 30 MG capsule Take 1 capsule (30 mg total) by mouth daily as needed.   lisinopril (ZESTRIL) 20 MG tablet Take 20 mg by mouth daily.   meclizine (ANTIVERT) 25 MG tablet meclizine 25 mg tablet  TAKE 1 TABLET BY MOUTH TWICE A DAY AS NEEDED   Nutritional Supplements (FEEDING SUPPLEMENT, NEPRO CARB STEADY,) LIQD Take 237 mLs by mouth 2 (two) times daily between meals.   ondansetron (ZOFRAN) 4 MG tablet Take 4 mg by mouth every 8 (eight) hours as needed for vomiting or nausea.   rosuvastatin (CRESTOR) 40 MG tablet TAKE 1 TABLET BY MOUTH EVERY DAY   traMADol (ULTRAM) 50 MG tablet TAKE 1-2 TABLETS (50-100 MG TOTAL) BY MOUTH AT BEDTIME AS NEEDED.   metFORMIN (GLUCOPHAGE) 1000 MG tablet Take 1,000  mg by mouth daily with breakfast.   No facility-administered medications prior to visit.    Review of Systems       Objective    BP 103/72 (BP Location: Right Arm, Patient Position: Sitting, Cuff Size: Normal)   Pulse 65   Temp 98 F (36.7 C) (Temporal)   Resp 12   Ht 4\' 11"  (1.499 m)   Wt 163 lb 14.4 oz (74.3 kg)   SpO2 99%   BMI 33.10 kg/m      Physical Exam Vitals reviewed.  Constitutional:      General: She  is not in acute distress.    Appearance: Normal appearance. She is not ill-appearing, toxic-appearing or diaphoretic.  Eyes:     Conjunctiva/sclera: Conjunctivae normal.  Cardiovascular:     Rate and Rhythm: Normal rate and regular rhythm.     Pulses: Normal pulses.     Heart sounds: Normal heart sounds. No murmur heard.    No friction rub. No gallop.  Pulmonary:     Effort: Pulmonary effort is normal. No respiratory distress.     Breath sounds: Normal breath sounds. No stridor. No wheezing, rhonchi or rales.  Abdominal:     General: Bowel sounds are normal. There is no distension.     Palpations: Abdomen is soft.     Tenderness: There is no abdominal tenderness.  Musculoskeletal:     Right lower leg: No edema.     Left lower leg: No edema.  Skin:    Findings: No erythema or rash.  Neurological:     Mental Status: She is alert and oriented to person, place, and time.        No results found for any visits on 12/19/22.  Assessment & Plan     Problem List Items Addressed This Visit     Cervical myelopathy (HCC)     Reports back pain, particularly when on feet for extended periods. -chronic -PRN meds control pain  -Continue Tramadol 50mg  as needed at bedtime.      Diabetes mellitus, type 2 (HCC)     A1c 7.1 last month, slightly above goal of <7. Currently on Metformin 1000mg  daily. Discussed the challenges with insurance coverage for [place] and potential alternatives. Chronic, not yet at goal -Continue Metformin 1000mg  daily. -Consider adding Glipizide if affordable and necessary to achieve glycemic control. -Check A1c in 6 months.      Relevant Medications   metFORMIN (GLUCOPHAGE) 1000 MG tablet   Esophagitis, reflux    On Prevacid 30mg . -chronic,well controlled on medication  -Continue Prevacid 30mg  daily.      Essential (primary) hypertension - Primary    Well controlled on Lisinopril 20mg  daily. Creatinine 0.89 as of June, indicating good kidney  function. -chronic -normal range  -Continue Lisinopril 20mg  daily.      HLD (hyperlipidemia)    On Crestor 40mg  for cholesterol management. -chronic -Continue Crestor 40mg  daily.        Vertigo: Reports occasional inner ear issues. -chronic, intermittent -responsive to current therapy  -Continue Meclizine 25mg  twice a day as needed.  General Health Maintenance / Followup Plans: -Follow up in 3 months to reassess medication regimen once insurance coverage is established. -Check in with nephrologist in 6 months.        Return in about 3 months (around 03/21/2023) for DM.         Barbara Ramp, MD  [facility] [phone] (phone) [phone] (fax)  [facility]

## 2022-12-19 NOTE — Assessment & Plan Note (Signed)
Well controlled on Lisinopril 20mg  daily. Creatinine 0.89 as of June, indicating good kidney function. -chronic -normal range  -Continue Lisinopril 20mg  daily.

## 2022-12-22 ENCOUNTER — Ambulatory Visit: Payer: Self-pay | Admitting: *Deleted

## 2022-12-22 NOTE — Patient Outreach (Signed)
  Care Coordination   12/22/2022 Name: Barbara Holmes MRN: [id number] DOB: [birth year]   Care Coordination Outreach Attempts:  An unsuccessful telephone outreach was attempted for a scheduled appointment today.  Follow Up Plan:  Additional outreach attempts will be made to offer the patient care coordination information and services.   Encounter Outcome:  No Answer   Care Coordination Interventions:  No, not indicated    Kemper Durie, RN, MSN, [facility] Care Management Care Management Coordinator [phone]

## 2023-01-04 ENCOUNTER — Other Ambulatory Visit: Payer: Self-pay | Admitting: Physician Assistant

## 2023-01-04 DIAGNOSIS — G2581 Restless legs syndrome: Secondary | ICD-10-CM

## 2023-01-08 ENCOUNTER — Telehealth: Payer: Self-pay | Admitting: *Deleted

## 2023-01-08 NOTE — Patient Outreach (Signed)
  Care Coordination   01/08/2023 Name: Barbara Holmes MRN: [id number] DOB: [birth year]   Care Coordination Outreach Attempts:  An unsuccessful telephone outreach was attempted today to offer the patient information about available care coordination services.  Follow Up Plan:  Additional outreach attempts will be made to offer the patient care coordination information and services.   Encounter Outcome:  No Answer   Care Coordination Interventions:  No, not indicated    Kemper Durie, RN, MSN, [facility] Care Management Care Management Coordinator [phone]

## 2023-01-12 ENCOUNTER — Ambulatory Visit: Payer: Self-pay | Admitting: *Deleted

## 2023-01-12 NOTE — Patient Outreach (Signed)
  Care Coordination   01/12/2023 Name: Shalene Tape MRN: [id number] DOB: 1961-05-20   Care Coordination Outreach Attempts:  An unsuccessful telephone outreach was attempted for a scheduled appointment today.  Follow Up Plan:  Additional outreach attempts will be made to offer the patient care coordination information and services.   Encounter Outcome:  No Answer   Care Coordination Interventions:  No, not indicated    Kemper Durie, RN, MSN, [facility] Care Management Care Management Coordinator [phone]

## 2023-02-11 ENCOUNTER — Other Ambulatory Visit: Payer: Self-pay | Admitting: Family Medicine

## 2023-02-11 DIAGNOSIS — G2581 Restless legs syndrome: Secondary | ICD-10-CM

## 2023-02-13 NOTE — Telephone Encounter (Signed)
Requested medication (s) are due for refill today: Yes  Requested medication (s) are on the active medication list: Yes  Last refill:  01/04/23  Future visit scheduled: Yes  Notes to clinic:  Not delegated.    Requested Prescriptions  Pending Prescriptions Disp Refills   traMADol (ULTRAM) 50 MG tablet [Pharmacy Med Name: TRAMADOL HCL 50 MG TABLET] 60 tablet 0    Sig: Take 1-2 tablets (50-100 mg total) by mouth at bedtime as needed.     Not Delegated - Analgesics:  Opioid Agonists Failed - 02/11/2023  8:23 PM      Failed - This refill cannot be delegated      Failed - Urine Drug Screen completed in last 360 days      Passed - Valid encounter within last 3 months    Recent Outpatient Visits           1 month ago Essential (primary) hypertension   [place] [facility] Simmons-Robinson, [place], MD   3 months ago Annual physical exam   [place] [facility] Simmons-Robinson, [place], MD   4 months ago Essential (primary) hypertension   [place] [facility] Simmons-Robinson, [place], MD   5 months ago AKI (acute kidney injury) (HCC)   [place] [facility] Simmons-Robinson, Tawanna Cooler, MD   6 months ago Essential (primary) hypertension   [place] [facility] Simmons-Robinson, Financial controller, MD       Future Appointments             In 1 month Simmons-Robinson, Tawanna Cooler, MD [facility]

## 2023-02-16 ENCOUNTER — Other Ambulatory Visit: Payer: Self-pay | Admitting: Family Medicine

## 2023-03-18 ENCOUNTER — Other Ambulatory Visit: Payer: Self-pay | Admitting: Family Medicine

## 2023-03-18 DIAGNOSIS — E782 Mixed hyperlipidemia: Secondary | ICD-10-CM

## 2023-03-19 NOTE — Telephone Encounter (Signed)
Requested Prescriptions  Pending Prescriptions Disp Refills   rosuvastatin (CRESTOR) 40 MG tablet [Pharmacy Med Name: ROSUVASTATIN CALCIUM 40 MG TAB] 90 tablet 2    Sig: TAKE 1 TABLET BY MOUTH EVERY DAY     Cardiovascular:  Antilipid - Statins 2 Failed - 03/18/2023  8:50 AM      Failed - Cr in normal range and within 360 days    Creatinine, Ser  Date Value Ref Range Status  10/05/2022 1.02 (H) 0.57 - 1.00 mg/dL Final   Creatinine, Urine  Date Value Ref Range Status  09/04/2022 86 mg/dL Final    Comment:    Performed at [facility], [address] [place], [place] [address]  09/04/2022 84 mg/dL Final         Failed - Lipid Panel in normal range within the last 12 months    Cholesterol, Total  Date Value Ref Range Status  11/07/2022 123 100 - 199 mg/dL Final   LDL Chol Calc (NIH)  Date Value Ref Range Status  11/07/2022 57 0 - 99 mg/dL Final   HDL  Date Value Ref Range Status  11/07/2022 46 >39 mg/dL Final   Triglycerides  Date Value Ref Range Status  11/07/2022 111 0 - 149 mg/dL Final         Passed - Patient is not pregnant      Passed - Valid encounter within last 12 months    Recent Outpatient Visits           3 months ago Essential (primary) hypertension   [place] [facility] Simmons-Robinson, [place], MD   4 months ago Annual physical exam   [place] [facility] Simmons-Robinson, [place], MD   5 months ago Essential (primary) hypertension   [place] [facility] Simmons-Robinson, [place], MD   6 months ago AKI (acute kidney injury) (HCC)   [place] [facility] Simmons-Robinson, Tawanna Cooler, MD   7 months ago Essential (primary) hypertension     [facility] Simmons-Robinson, Financial controller, MD       Future Appointments             In 2 days Simmons-Robinson, Tawanna Cooler, MD [facility]

## 2023-03-21 ENCOUNTER — Ambulatory Visit: Payer: BC Managed Care – PPO | Admitting: Family Medicine

## 2023-03-21 ENCOUNTER — Encounter: Payer: Self-pay | Admitting: Family Medicine

## 2023-03-21 VITALS — BP 119/64 | HR 72 | Temp 97.8°F | Ht 59.0 in | Wt 157.8 lb

## 2023-03-21 DIAGNOSIS — Z23 Encounter for immunization: Secondary | ICD-10-CM

## 2023-03-21 DIAGNOSIS — G2581 Restless legs syndrome: Secondary | ICD-10-CM | POA: Diagnosis not present

## 2023-03-21 DIAGNOSIS — E1169 Type 2 diabetes mellitus with other specified complication: Secondary | ICD-10-CM

## 2023-03-21 DIAGNOSIS — I10 Essential (primary) hypertension: Secondary | ICD-10-CM | POA: Diagnosis not present

## 2023-03-21 DIAGNOSIS — E119 Type 2 diabetes mellitus without complications: Secondary | ICD-10-CM

## 2023-03-21 DIAGNOSIS — E782 Mixed hyperlipidemia: Secondary | ICD-10-CM

## 2023-03-21 DIAGNOSIS — E785 Hyperlipidemia, unspecified: Secondary | ICD-10-CM

## 2023-03-21 MED ORDER — TIRZEPATIDE 5 MG/0.5ML ~~LOC~~ SOAJ
5.0000 mg | SUBCUTANEOUS | 2 refills | Status: DC
Start: 2023-03-21 — End: 2023-12-11

## 2023-03-21 NOTE — Progress Notes (Unsigned)
Established patient visit   Patient: Barbara Holmes   DOB: 09/22/1960   62 y.o. Female  MRN: [id number] Visit Date: 03/21/2023  Today's healthcare provider: Ronnald Ramp, MD   Chief Complaint  Patient presents with   Follow-up    3 month follow up for diabetes   Subjective     HPI     Follow-up    Additional comments: 3 month follow up for diabetes      Last edited by Derinda Late A on 03/21/2023  3:48 PM.       Discussed the use of AI scribe software for clinical note transcription with the patient, who gave verbal consent to proceed.  History of Present Illness   The patient, with a history of diabetes, hypertension, and chronic pain, presents for a routine follow-up. She reports adherence to her current medication regimen, which includes metformin 1000mg  daily, lisinopril 20mg  daily, Crestor 40mg  daily, and tramadol 50-100mg  as needed for chronic leg pain. The patient notes that if she does not take the tramadol early enough, she experiences significant discomfort that disrupts her sleep.  The patient's last A1c in June was 7.1, slightly above the goal of 7. She had previously been on [place], but these were discontinued due to concerns about kidney function. The patient reports that her daily blood glucose levels have been fluctuating since discontinuing these medications.  In addition to her medical concerns, the patient has recently started a new job that requires significant physical activity, including walking over six miles a day. She expresses some frustration with the demands of the job and the impact on her performance metrics. Despite these challenges, she reports regular attendance and punctuality.  The patient's upcoming birthday is noted, and she expresses some consideration of retirement due to the physical demands of her current job. She is scheduled to see a nephrologist in December for further evaluation of her kidney  function.        Past Medical History:  Diagnosis Date   Diabetes mellitus without complication (HCC)    Hyperlipidemia    Hypertension    Restless leg syndrome     Medications: Outpatient Medications Prior to Visit  Medication Sig   albuterol (VENTOLIN HFA) 108 (90 Base) MCG/ACT inhaler Ventolin HFA 90 mcg/actuation aerosol inhaler  INHALE 2 PUFFS BY MOUTH EVERY 4 HOURS AS NEEDED FOR COUGH OR WHEEZING   Cholecalciferol 1000 units capsule Take 1,000 Units by mouth daily.    lansoprazole (PREVACID) 30 MG capsule Take 1 capsule (30 mg total) by mouth daily as needed.   lisinopril (ZESTRIL) 20 MG tablet TAKE 1 TABLET BY MOUTH EVERY DAY   meclizine (ANTIVERT) 25 MG tablet meclizine 25 mg tablet  TAKE 1 TABLET BY MOUTH TWICE A DAY AS NEEDED   metFORMIN (GLUCOPHAGE-XR) 500 MG 24 hr tablet TAKE 2 TABLETS BY MOUTH EVERY DAY WITH BREAKFAST   ondansetron (ZOFRAN) 4 MG tablet Take 4 mg by mouth every 8 (eight) hours as needed for vomiting or nausea.   rosuvastatin (CRESTOR) 40 MG tablet TAKE 1 TABLET BY MOUTH EVERY DAY   traMADol (ULTRAM) 50 MG tablet TAKE 1-2 TABLETS (50-100 MG TOTAL) BY MOUTH AT BEDTIME AS NEEDED.   metFORMIN (GLUCOPHAGE) 1000 MG tablet Take 1,000 mg by mouth daily with breakfast. (Patient not taking: Reported on 03/21/2023)   Nutritional Supplements (FEEDING SUPPLEMENT, NEPRO CARB STEADY,) LIQD Take 237 mLs by mouth 2 (two) times daily between meals. (Patient not taking: Reported on  03/21/2023)   No facility-administered medications prior to visit.    Review of Systems  Last metabolic panel Lab Results  Component Value Date   GLUCOSE 220 (H) 10/05/2022   NA 140 10/05/2022   K 4.3 10/05/2022   CL 100 10/05/2022   CO2 23 10/05/2022   BUN 27 10/05/2022   CREATININE 1.02 (H) 10/05/2022   EGFR 62 10/05/2022   CALCIUM 9.7 10/05/2022   PROT 6.0 (L) 09/06/2022   ALBUMIN 3.0 (L) 09/06/2022   LABGLOB 3.0 09/04/2022   AGRATIO 1.0 09/04/2022   BILITOT 0.8 09/06/2022    ALKPHOS 50 09/06/2022   AST 77 (H) 09/06/2022   ALT 130 (H) 09/06/2022   ANIONGAP 11 09/08/2022   Last lipids Lab Results  Component Value Date   CHOL 123 11/07/2022   HDL 46 11/07/2022   LDLCALC 57 11/07/2022   TRIG 111 11/07/2022   CHOLHDL 2.7 11/07/2022   Last hemoglobin A1c Lab Results  Component Value Date   HGBA1C 7.1 (H) 11/07/2022        Objective    BP 119/64 (BP Location: Right Arm, Patient Position: Sitting, Cuff Size: Large)   Pulse 72   Temp 97.8 F (36.6 C) (Oral)   Ht 4\' 11"  (1.499 m)   Wt 157 lb 12.8 oz (71.6 kg)   SpO2 95%   BMI 31.87 kg/m   BP Readings from Last 3 Encounters:  03/21/23 119/64  12/19/22 103/72  11/07/22 110/72   Wt Readings from Last 3 Encounters:  03/21/23 157 lb 12.8 oz (71.6 kg)  12/19/22 163 lb 14.4 oz (74.3 kg)  11/07/22 160 lb 8 oz (72.8 kg)       Physical Exam  Physical Exam   VITALS: Blood pressure perfect. CARDIOVASCULAR: Heart rhythm regular, no murmurs detected. ABDOMEN: Abdominal sounds normal. EXTREMITIES: No swelling observed.       No results found for any visits on 03/21/23.  Assessment & Plan     Problem List Items Addressed This Visit     Diabetes mellitus, type 2 (HCC) - Primary       Type 2 Diabetes Mellitus,Chronic  Last A1C was 7.1 in June. Patient is currently on Metformin 1000mg  daily. Discussed the possibility of adding Mounjaro back to the regimen, which was previously stopped due to a kidney injury. Patient reported good control with Mounjaro in the past. -Check A1C and kidney function today.(cmP) -Resume Mounjaro 5mg  weekly  -Plan to recheck A1C in 3 months. -pt on ACE, DM foot exam UTD  -need DM eye exam       Relevant Medications   tirzepatide (MOUNJARO) 5 MG/0.5ML Pen   Other Relevant Orders   Hemoglobin A1c   Essential (primary) hypertension    Chronic  BP within goal range today  Well controlled on Lisinopril 20mg  daily. -Continue current regimen.      Relevant Orders    Comprehensive metabolic panel   Hyperlipidemia associated with type 2 diabetes mellitus (HCC)    Chronic  Last lipid panel was close to goal of less than 55  Continue crestor 40mg  daily      Relevant Medications   tirzepatide [facility]) 5 MG/0.5ML Pen   Restless leg    Chronic  Symptoms controlled on tramadol 50-100mg  qHS  She has not been able to tolerate requip for this problem due to SE  Patient reports needing Tramadol 50-100mg  for leg pain, especially at night. -Continue Tramadol as needed.      Other Visit Diagnoses  Immunization due       Relevant Orders   Flu vaccine trivalent PF, 6mos and older(Flulaval,Afluria,Fluarix,Fluzone) (Completed)         Return in about 3 months (around 06/21/2023) for HTN, DM.         Ronnald Ramp, MD  [facility] [phone] (phone) [phone] (fax)  [facility]

## 2023-03-22 NOTE — Assessment & Plan Note (Addendum)
  Type 2 Diabetes Mellitus,Chronic  Last A1C was 7.1 in June. Patient is currently on Metformin 1000mg  daily. Discussed the possibility of adding Mounjaro back to the regimen, which was previously stopped due to a kidney injury. Patient reported good control with Mounjaro in the past. -Check A1C and kidney function today.(cmP) -Resume Mounjaro 5mg  weekly  -Plan to recheck A1C in 3 months. -pt on ACE, DM foot exam UTD  -need DM eye exam

## 2023-03-22 NOTE — Assessment & Plan Note (Signed)
Chronic  BP within goal range today  Well controlled on Lisinopril 20mg  daily. -Continue current regimen.

## 2023-03-22 NOTE — Assessment & Plan Note (Signed)
Chronic  Symptoms controlled on tramadol 50-100mg  qHS  She has not been able to tolerate requip for this problem due to SE  Patient reports needing Tramadol 50-100mg  for leg pain, especially at night. -Continue Tramadol as needed.

## 2023-03-22 NOTE — Assessment & Plan Note (Signed)
Chronic  Last lipid panel was close to goal of less than 55  Continue crestor 40mg  daily

## 2023-03-23 LAB — COMPREHENSIVE METABOLIC PANEL
ALT: 20 [IU]/L (ref 0–32)
AST: 21 [IU]/L (ref 0–40)
Albumin: 4.4 g/dL (ref 3.9–4.9)
Alkaline Phosphatase: 67 [IU]/L (ref 44–121)
BUN/Creatinine Ratio: 19 (ref 12–28)
BUN: 21 mg/dL (ref 8–27)
Bilirubin Total: 0.3 mg/dL (ref 0.0–1.2)
CO2: 26 mmol/L (ref 20–29)
Calcium: 9.9 mg/dL (ref 8.7–10.3)
Chloride: 101 mmol/L (ref 96–106)
Creatinine, Ser: 1.12 mg/dL — ABNORMAL HIGH (ref 0.57–1.00)
Globulin, Total: 2.8 g/dL (ref 1.5–4.5)
Glucose: 109 mg/dL — ABNORMAL HIGH (ref 70–99)
Potassium: 4.3 mmol/L (ref 3.5–5.2)
Sodium: 142 mmol/L (ref 134–144)
Total Protein: 7.2 g/dL (ref 6.0–8.5)
eGFR: 55 mL/min/{1.73_m2} — ABNORMAL LOW (ref >59)

## 2023-03-23 LAB — HEMOGLOBIN A1C
Est. average glucose Bld gHb Est-mCnc: 146 mg/dL
Hgb A1c MFr Bld: 6.7 % — ABNORMAL HIGH (ref 4.8–5.6)

## 2023-03-26 ENCOUNTER — Telehealth: Payer: Self-pay | Admitting: Family Medicine

## 2023-03-26 NOTE — Telephone Encounter (Signed)
Received a fax from covermymeds for Mounjaro 5mg /0.65ml  Key: [id number]

## 2023-03-28 NOTE — Telephone Encounter (Signed)
PA initiated and notes faxed to be attach

## 2023-03-30 ENCOUNTER — Other Ambulatory Visit: Payer: Self-pay | Admitting: Family Medicine

## 2023-03-30 DIAGNOSIS — G2581 Restless legs syndrome: Secondary | ICD-10-CM

## 2023-03-30 NOTE — Telephone Encounter (Signed)
Medication Refill - Medication: traMADol (ULTRAM) 50 MG tablet   Has the patient contacted their pharmacy? Yes.     Preferred Pharmacy (with phone number or street name):  [facility] [address] [place]), [place] - [address] Phone: [phone]  Fax: [phone]      Has the patient been seen for an appointment in the last year OR does the patient have an upcoming appointment? Yes.    Please assist patient further

## 2023-04-02 NOTE — Telephone Encounter (Signed)
Requested medication (s) are due for refill today:yes  Requested medication (s) are on the active medication list: yes  Last refill:  02/14/23 #60  Future visit scheduled: yes  Notes to clinic:  med not delegated to NT to RF   Requested Prescriptions  Pending Prescriptions Disp Refills   traMADol (ULTRAM) 50 MG tablet 60 tablet 0    Sig: Take 1-2 tablets (50-100 mg total) by mouth at bedtime as needed.     Not Delegated - Analgesics:  Opioid Agonists Failed - 03/30/2023  5:26 PM      Failed - This refill cannot be delegated      Failed - Urine Drug Screen completed in last 360 days      Passed - Valid encounter within last 3 months    Recent Outpatient Visits           1 week ago Type 2 diabetes mellitus without complication, without long-term current use of insulin (HCC)   [place] [facility] Simmons-Robinson, [place], MD   3 months ago Essential (primary) hypertension   [facility] Simmons-Robinson, [place], MD   4 months ago Annual physical exam   [place] [facility] Simmons-Robinson, [place], MD   5 months ago Essential (primary) hypertension   [place] [facility] Simmons-Robinson, [place], MD   6 months ago AKI (acute kidney injury) [facility])   [place] [facility] Simmons-Robinson, Tawanna Cooler, MD       Future Appointments             In 2 months Simmons-Robinson, Tawanna Cooler, MD [facility]

## 2023-04-04 MED ORDER — TRAMADOL HCL 50 MG PO TABS: 50.0000 mg | ORAL_TABLET | Freq: Every evening | ORAL | 0 refills | Status: DC | PRN

## 2023-04-04 NOTE — Telephone Encounter (Signed)
Pt called in status of refill of tramodol put in 10/25. She sates really needs this soon or she wont be able to sleep at night.  I let her know of 48-72 hr turn around, and told her hopefully she should get a cal from pharmacy before end of business.

## 2023-04-04 NOTE — Telephone Encounter (Signed)
Please advise 

## 2023-04-09 ENCOUNTER — Telehealth: Payer: Self-pay | Admitting: Family Medicine

## 2023-04-09 NOTE — Telephone Encounter (Signed)
Received a fax from covermymeds for tramadol [employer] 50mg   Key: [id number]

## 2023-04-11 NOTE — Telephone Encounter (Signed)
PA initiated and sent to [facility]

## 2023-05-14 ENCOUNTER — Other Ambulatory Visit: Payer: Self-pay | Admitting: Family Medicine

## 2023-05-14 DIAGNOSIS — G2581 Restless legs syndrome: Secondary | ICD-10-CM

## 2023-05-16 ENCOUNTER — Telehealth: Payer: Self-pay | Admitting: Family Medicine

## 2023-05-16 NOTE — Telephone Encounter (Signed)
Received a fax from covermymeds for mounjaro 2.5mg /0.14ml  Key:  [id number]

## 2023-05-17 ENCOUNTER — Other Ambulatory Visit: Payer: Self-pay | Admitting: Family Medicine

## 2023-05-17 ENCOUNTER — Ambulatory Visit: Payer: BC Managed Care – PPO | Admitting: Family Medicine

## 2023-05-17 ENCOUNTER — Encounter: Payer: Self-pay | Admitting: Family Medicine

## 2023-05-17 VITALS — BP 127/70 | Temp 97.7°F | Ht 59.0 in | Wt 137.0 lb

## 2023-05-17 DIAGNOSIS — I10 Essential (primary) hypertension: Secondary | ICD-10-CM

## 2023-05-17 DIAGNOSIS — K5900 Constipation, unspecified: Secondary | ICD-10-CM | POA: Insufficient documentation

## 2023-05-17 DIAGNOSIS — R42 Dizziness and giddiness: Secondary | ICD-10-CM | POA: Diagnosis not present

## 2023-05-17 MED ORDER — LISINOPRIL 10 MG PO TABS: 10.0000 mg | ORAL_TABLET | Freq: Every day | ORAL | 3 refills | Status: DC

## 2023-05-17 NOTE — Assessment & Plan Note (Addendum)
BP today within range and orthostatic blood pressure negative. Given concerns for fluctuating BP, lightheadedness, near syncopal episodes will reduce daily maintenance medication.  Pt currently on Lisinopril 20 mg tablet daily, reducing dose to Lisinopril 10mg  tablet daily. May cut current 20mg  tablets in half if scored.  Continue to monitor BP at home.

## 2023-05-17 NOTE — Progress Notes (Signed)
Acute Office Visit  Introduced to nurse practitioner role and practice setting.  All questions answered.  Discussed provider/patient relationship and expectations.   Subjective:     Patient ID: Barbara Holmes, female    DOB: [birth year], 62 y.o.   MRN: [id number]  Pt presents today with ex-husband for 2-3 weeks of episodes of feeling lightheadedness, dizziness, and faint. Episodes will happen a couple of times throughout day at work and sometimes at home, usually while she is bending over, grabbing items for her job, or doing things around house. She is find is she is sitting, but standing up too quickly she can get dizzy. States she has not physically passed out, but feels like blood is draining out of head. She states she will stop what she is doing either sit or stand sit, and the episode subsides within one minute or less. She has taken her BP during an episode which read 110/40 and she has checked her blood glucose during episode which was in 120s. She will get sweaty feel nauseous, and have a headache with the lightheadedness, but this goes away shortly once she rests. Sometimes will have L sided neck pain, but was told her carotid sounds normal.She denies chest pain, chest tightness, shortness of breath, difficulty breathing, numbness, tingling, vision changes, or weakness. She states she has had vertigo before, but states this feels different.  Constipation - states has 1-2 bowel movements per week, stool is hard when she has one, and sometimes she will feel some distention in LLQ. Denies vomiting, diarrhea, abdominal pain, fevers  Essential Hypertension - daily lisinopril 20mg . States this morning 95/55, and highest SBP gets at home is 110s. Denies cough.     Review of Systems  All other systems reviewed and are negative.     Objective:    Temp 97.7 F (36.5 C)   Ht 4\' 11"  (1.499 m)   Wt 137 lb (62.1 kg)   SpO2 97%   BMI 27.67 kg/m  Orthostatic VS for the past 72 hrs (Last  3 readings):  Orthostatic BP Patient Position BP Location Cuff Size Orthostatic Pulse  05/17/23 0838 127/70 Supine Left Arm Normal 64  05/17/23 0837 118/75 Standing Left Arm Normal 78  05/17/23 0833 111/67 Standing Right Arm Normal 67      Physical Exam Constitutional:      General: She is not in acute distress.    Appearance: Normal appearance. She is normal weight. She is not ill-appearing, toxic-appearing or diaphoretic.  HENT:     Head: Normocephalic.     Mouth/Throat:     Mouth: Mucous membranes are moist.  Eyes:     Extraocular Movements: Extraocular movements intact.     Pupils: Pupils are equal, round, and reactive to light.  Cardiovascular:     Rate and Rhythm: Regular rhythm. Bradycardia present.     Chest Wall: No thrill.     Pulses:          Radial pulses are 2+ on the right side and 2+ on the left side.       Dorsalis pedis pulses are 1+ on the right side and 1+ on the left side.       Posterior tibial pulses are 1+ on the right side and 1+ on the left side.     Heart sounds: Normal heart sounds. No murmur heard.    No friction rub.     Comments: No bruits heard in carotids bilaterally  Pulmonary:  Effort: Pulmonary effort is normal. No respiratory distress.     Breath sounds: Normal breath sounds. No wheezing or rhonchi.  Chest:     Chest wall: No tenderness.  Abdominal:     General: Abdomen is flat.     Palpations: Abdomen is soft.  Musculoskeletal:        General: Normal range of motion.     Cervical back: Normal range of motion.  Skin:    General: Skin is warm and dry.     Capillary Refill: Capillary refill takes less than 2 seconds.  Neurological:     Mental Status: She is alert.    No results found for any visits on 05/17/23.  EKG: unchanged from previous tracings, sinus bradycardia, RBBB     Assessment & Plan:   Problem List Items Addressed This Visit       Cardiovascular and Mediastinum   Essential (primary) hypertension   BP today  within range and orthostatic blood pressure negative. Given concerns for fluctuating BP, lightheadedness, near syncopal episodes will reduce daily maintenance medication.  Pt currently on Lisinopril 20 mg tablet daily, reducing dose to Lisinopril 10mg  tablet daily. May cut current 20mg  tablets in half if scored.  Continue to monitor BP at home.        Relevant Medications   lisinopril (ZESTRIL) 10 MG tablet     Other   Postural lightheadedness - Primary   Orthostatic Blood pressure negative in office. EKG showed Sinus Bradycardia with RBBB - which was unchanged from April 2024. Pt has had vertigo before and this feels different to her, no spinning. Pt has checked blood glucose and normal during episodes, so less likely hypoglycemia episodes. Given episodes are sudden lightheadedness, and near syncopal in nature, worsening over last 3 weeks, concerning for cardiac origin. Discussed intake of adequate fluids/water throughout day, minimally drinking around 60 ounces of water per water -Referral to Cardiology Urgent - Bilateral US Carotid ordered - CBC, CMP, TSH, and Iron studies ordered - rule out any anemia thyroid hormone issues, electrolyte imbalance, liver dysfunction or kidney dysfunction.       Relevant Orders   EKG 12-Lead   Ambulatory referral to Cardiology   US Carotid Duplex Bilateral   CBC with Differential   Comprehensive metabolic panel   Iron, TIBC and Ferritin Panel   TSH + free T4   Constipation   Pt had concerns today for worsening constipation due to decrease weekly actively and hardening of stool. Discussed increasing water/fluid intake, abdominal gentle massages, and increasing fiber supplement intake.   Discussed medication treatment if constipation worsens. as follows: 1)  First, take a stool softener twice daily for the next week. After that, just take it once daily. 2) Take either Miralax or prune juice once in the AM and once in the PM for the next several days.  Stop taking it if you have diarrhea. You can increase this to 3 times a day to help you go as well. 3) If you still haven't gone to the restroom after three days, it's time to try either a suppository or an enema. This should make you go. 4) Continue to take the stool softener and Miralax, even if you use the suppository.  Your goal is to have a soft bowel movement once daily. Once you start going to the bathroom, cut back to once or twice daily Miralax/prune prune juice until you achieve that goal.           Meds ordered this  encounter  Medications   lisinopril (ZESTRIL) 10 MG tablet    Sig: Take 1 tablet (10 mg total) by mouth daily.    Dispense:  90 tablet    Refill:  3    Will communicate lab results.   Return in about 4 weeks (around 06/14/2023) for BP follow up with PCP, Dr. Roxan Hockey.  I, Sallee Provencal, FNP, have reviewed all documentation for this visit. The documentation on 05/17/23 for the exam, diagnosis, procedures, and orders are all accurate and complete.   Sallee Provencal, FNP

## 2023-05-17 NOTE — Assessment & Plan Note (Signed)
Pt had concerns today for worsening constipation due to decrease weekly actively and hardening of stool. Discussed increasing water/fluid intake, abdominal gentle massages, and increasing fiber supplement intake.   Discussed medication treatment if constipation worsens. as follows: 1)  First, take a stool softener twice daily for the next week. After that, just take it once daily. 2) Take either Miralax or prune juice once in the AM and once in the PM for the next several days. Stop taking it if you have diarrhea. You can increase this to 3 times a day to help you go as well. 3) If you still haven't gone to the restroom after three days, it's time to try either a suppository or an enema. This should make you go. 4) Continue to take the stool softener and Miralax, even if you use the suppository.  Your goal is to have a soft bowel movement once daily. Once you start going to the bathroom, cut back to once or twice daily Miralax/prune prune juice until you achieve that goal.

## 2023-05-17 NOTE — Assessment & Plan Note (Addendum)
Orthostatic Blood pressure negative in office. EKG showed Sinus Bradycardia with RBBB - which was unchanged from April 2024. Pt has had vertigo before and this feels different to her, no spinning. Pt has checked blood glucose and normal during episodes, so less likely hypoglycemia episodes. Given episodes are sudden lightheadedness, and near syncopal in nature, worsening over last 3 weeks, concerning for cardiac origin. Discussed intake of adequate fluids/water throughout day, minimally drinking around 60 ounces of water per water -Referral to Cardiology Urgent - Bilateral US Carotid ordered - CBC, CMP, TSH, and Iron studies ordered - rule out any anemia thyroid hormone issues, electrolyte imbalance, liver dysfunction or kidney dysfunction.

## 2023-05-18 LAB — COMPREHENSIVE METABOLIC PANEL
ALT: 32 [IU]/L (ref 0–32)
AST: 41 [IU]/L — ABNORMAL HIGH (ref 0–40)
Albumin: 4.5 g/dL (ref 3.9–4.9)
Alkaline Phosphatase: 64 [IU]/L (ref 44–121)
BUN/Creatinine Ratio: 17 (ref 12–28)
BUN: 35 mg/dL — ABNORMAL HIGH (ref 8–27)
Bilirubin Total: 0.3 mg/dL (ref 0.0–1.2)
CO2: 21 mmol/L (ref 20–29)
Calcium: 9.8 mg/dL (ref 8.7–10.3)
Chloride: 106 mmol/L (ref 96–106)
Creatinine, Ser: 2.11 mg/dL — ABNORMAL HIGH (ref 0.57–1.00)
Globulin, Total: 2.8 g/dL (ref 1.5–4.5)
Glucose: 116 mg/dL — ABNORMAL HIGH (ref 70–99)
Potassium: 4.1 mmol/L (ref 3.5–5.2)
Sodium: 142 mmol/L (ref 134–144)
Total Protein: 7.3 g/dL (ref 6.0–8.5)
eGFR: 26 mL/min/{1.73_m2} — ABNORMAL LOW (ref >59)

## 2023-05-18 LAB — CBC WITH DIFFERENTIAL/PLATELET
Basophils Absolute: 0.1 10*3/uL (ref 0.0–0.2)
Basos: 1 %
EOS (ABSOLUTE): 0.2 10*3/uL (ref 0.0–0.4)
Eos: 3 %
Hematocrit: 36.9 % (ref 34.0–46.6)
Hemoglobin: 11.8 g/dL (ref 11.1–15.9)
Immature Grans (Abs): 0 10*3/uL (ref 0.0–0.1)
Immature Granulocytes: 0 %
Lymphocytes Absolute: 2 10*3/uL (ref 0.7–3.1)
Lymphs: 28 %
MCH: 29.5 pg (ref 26.6–33.0)
MCHC: 32 g/dL (ref 31.5–35.7)
MCV: 92 fL (ref 79–97)
Monocytes Absolute: 0.6 10*3/uL (ref 0.1–0.9)
Monocytes: 8 %
Neutrophils Absolute: 4.1 10*3/uL (ref 1.4–7.0)
Neutrophils: 60 %
Platelets: 209 10*3/uL (ref 150–450)
RBC: 4 x10E6/uL (ref 3.77–5.28)
RDW: 12.7 % (ref 11.7–15.4)
WBC: 7 10*3/uL (ref 3.4–10.8)

## 2023-05-18 LAB — TSH+FREE T4
Free T4: 1.05 ng/dL (ref 0.82–1.77)
TSH: 0.401 u[IU]/mL — ABNORMAL LOW (ref 0.450–4.500)

## 2023-05-18 LAB — IRON,TIBC AND FERRITIN PANEL
Ferritin: 325 ng/mL — ABNORMAL HIGH (ref 15–150)
Iron Saturation: 39 % (ref 15–55)
Iron: 116 ug/dL (ref 27–139)
Total Iron Binding Capacity: 300 ug/dL (ref 250–450)
UIBC: 184 ug/dL (ref 118–369)

## 2023-05-18 NOTE — Progress Notes (Signed)
TSH - subtly low, free t4 normal CMP - BUN elevated, Creatinine elevated to 2.11, GFR low at 26

## 2023-05-22 IMAGING — MG MM DIGITAL SCREENING BILAT W/ TOMO AND CAD
8 series · 8 of 24 positions shown · non-contrast
Comparison: Previous exam(s).

CLINICAL DATA: Screening.

EXAM:
DIGITAL SCREENING BILATERAL MAMMOGRAM WITH TOMOSYNTHESIS AND CAD
TECHNIQUE: Bilateral screening digital craniocaudal and mediolateral oblique
mammograms were obtained. Bilateral screening digital breast
tomosynthesis was performed. The images were evaluated with
computer-aided detection.

[R MLO synth-2D]
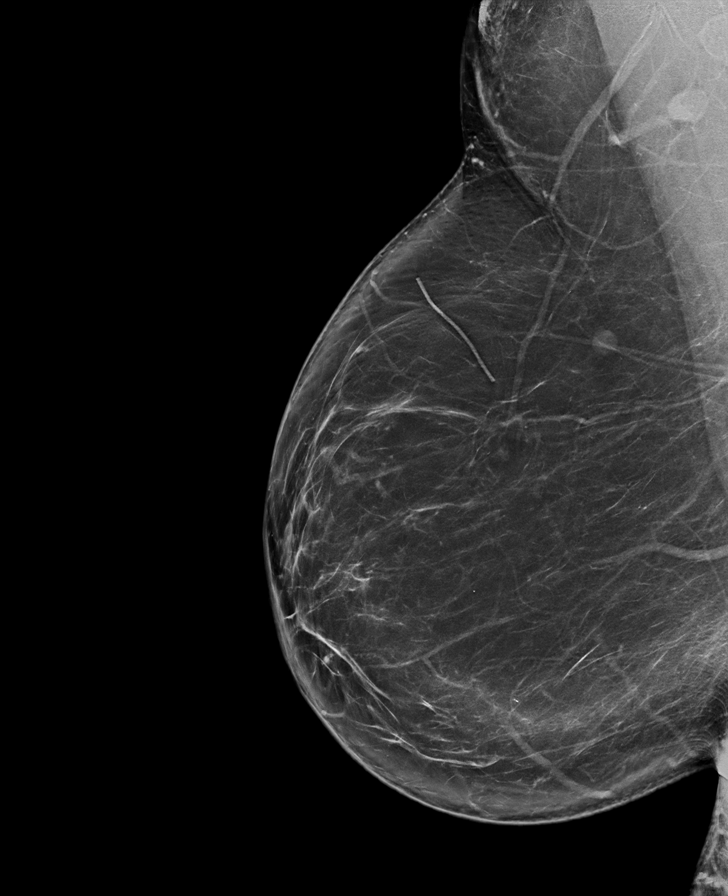

[L CC synth-2D]
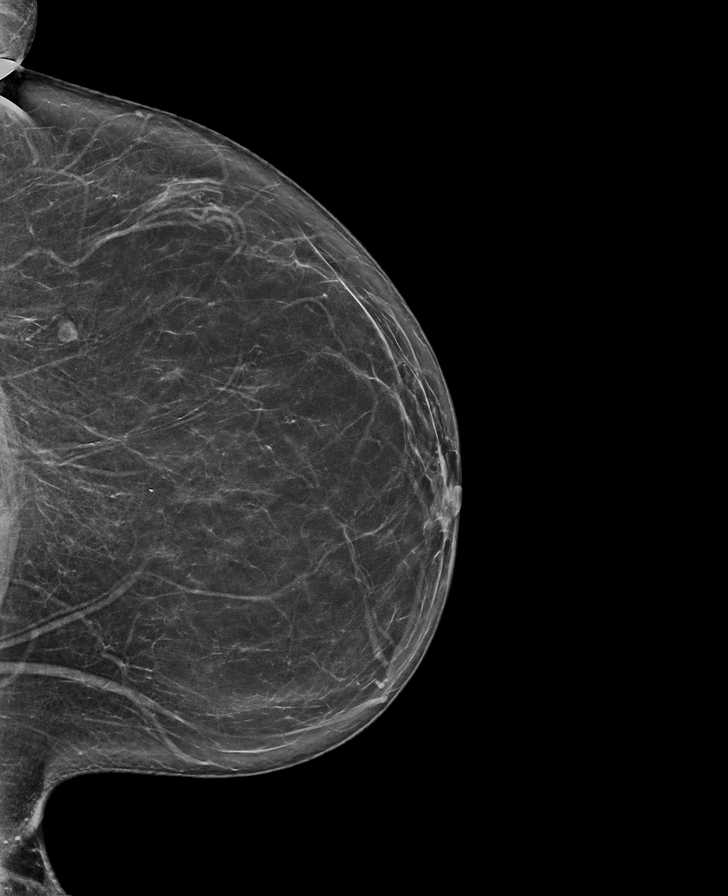

[R CC synth-2D]
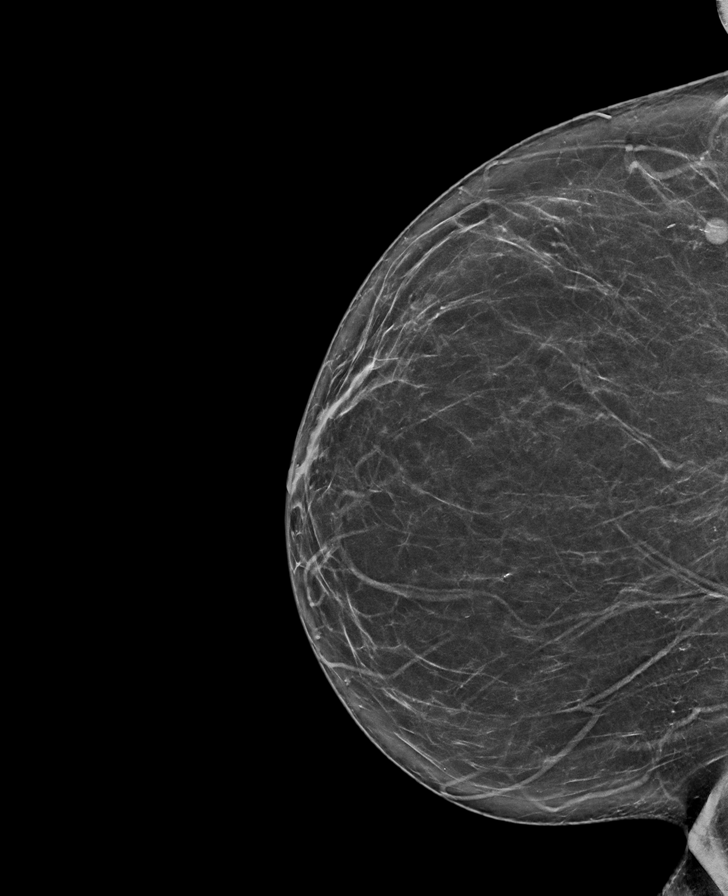

[L MLO synth-2D]
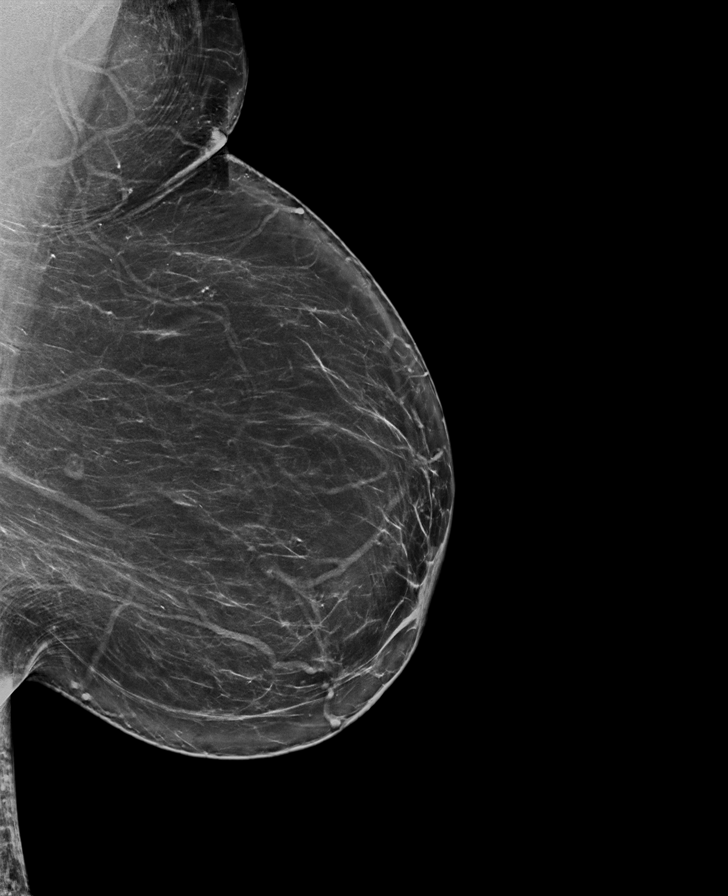

[R MLO tomo · tomo slice 46/91.0]
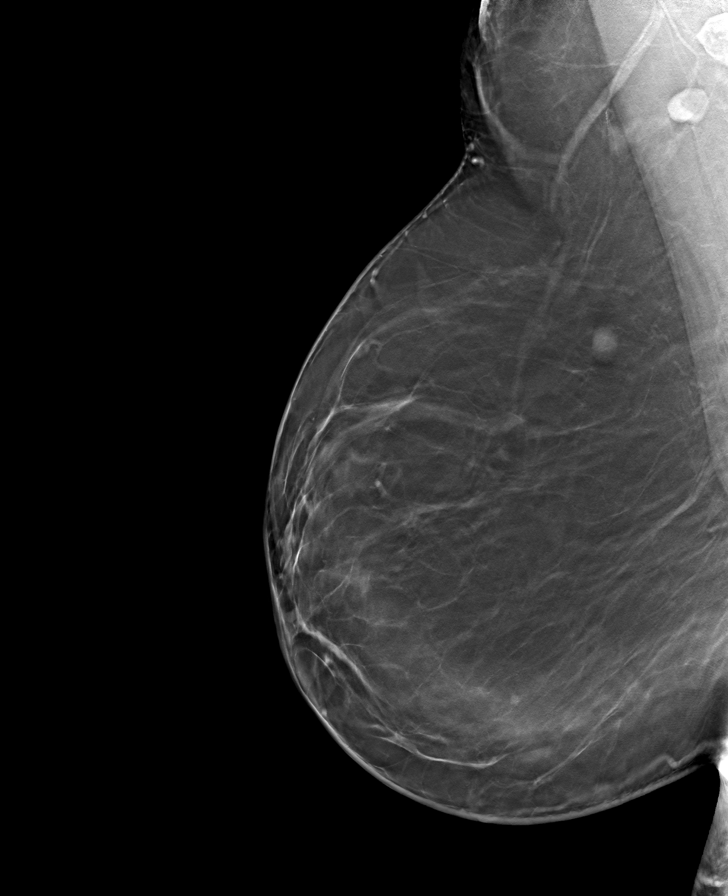

[L MLO tomo · tomo slice 45/90.0]
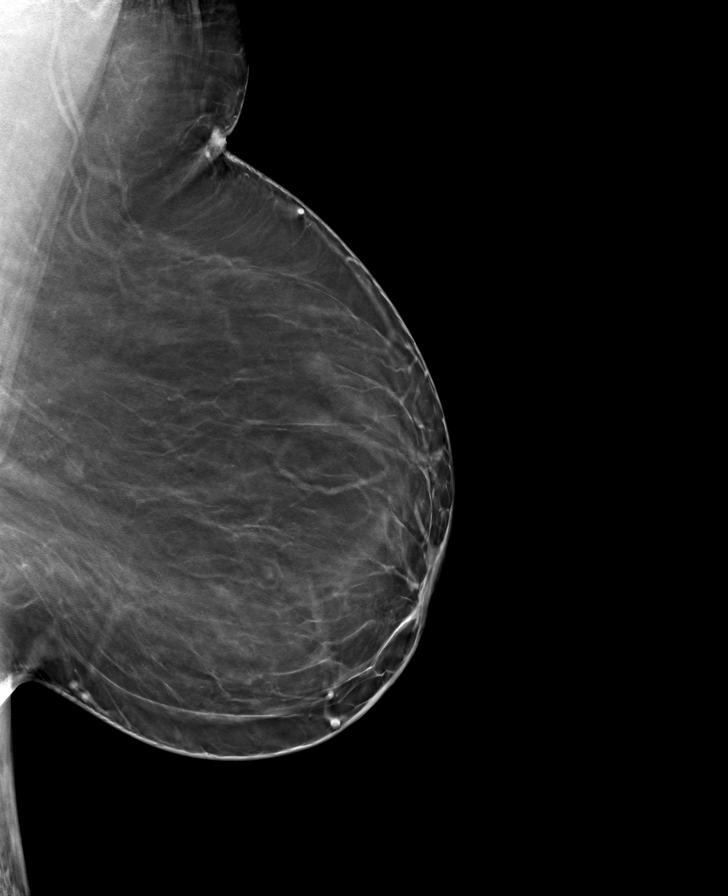

[R CC tomo · tomo slice 37/73.0]
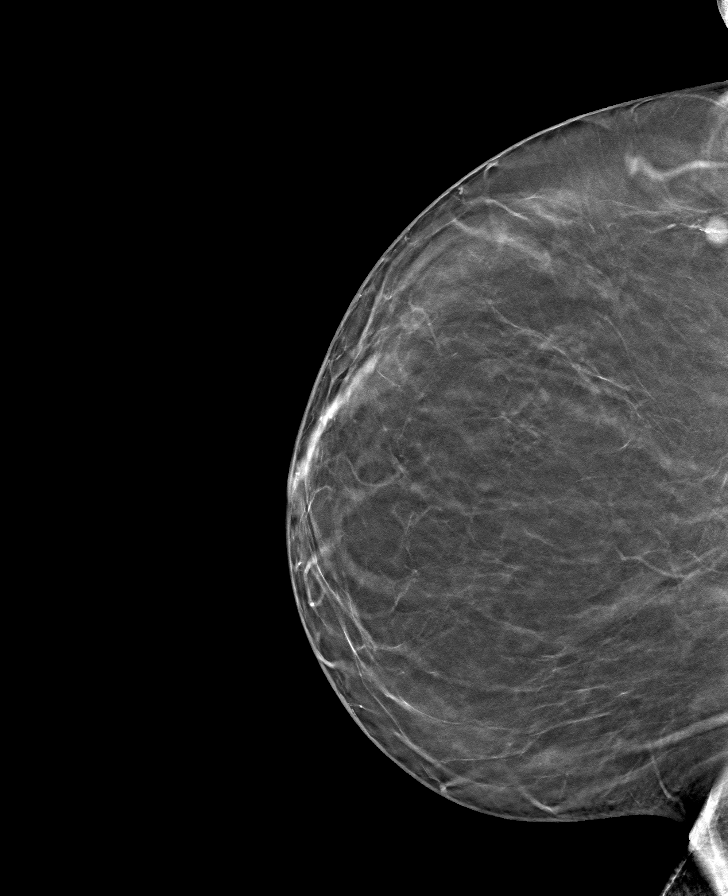

[L CC tomo · tomo slice 37/73.0]
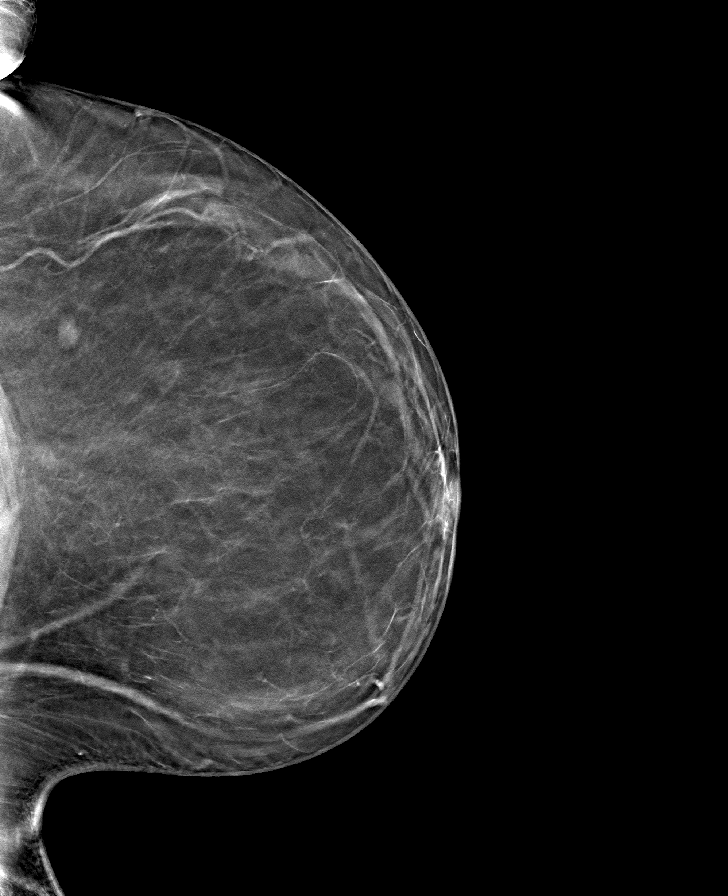

[8 of 24 positions shown; findings below may reference images not displayed]

ACR Breast Density Category b: There are scattered areas of
fibroglandular density.
FINDINGS: There are no findings suspicious for malignancy.
IMPRESSION: No mammographic evidence of malignancy. A result letter of this
screening mammogram will be mailed directly to the patient.

RECOMMENDATION:
Screening mammogram in one year. (Code:51-O-LD2)

BI-RADS CATEGORY  1: Negative.

## 2023-05-23 ENCOUNTER — Telehealth: Payer: Self-pay | Admitting: Family Medicine

## 2023-05-23 ENCOUNTER — Ambulatory Visit: Payer: BC Managed Care – PPO

## 2023-05-23 ENCOUNTER — Ambulatory Visit
Admission: RE | Admit: 2023-05-23 | Discharge: 2023-05-23 | Disposition: A | Discharge status: Home / Self Care | Payer: BC Managed Care – PPO | Source: Ambulatory Visit | Attending: Family Medicine | Admitting: Family Medicine

## 2023-05-23 DIAGNOSIS — R42 Dizziness and giddiness: Secondary | ICD-10-CM | POA: Insufficient documentation

## 2023-05-23 DIAGNOSIS — N1832 Chronic kidney disease, stage 3b: Secondary | ICD-10-CM | POA: Insufficient documentation

## 2023-05-23 LAB — HM DIABETES EYE EXAM

## 2023-05-23 NOTE — Telephone Encounter (Signed)
PA sent in to plan

## 2023-05-23 NOTE — Telephone Encounter (Signed)
Covermymeds is requesting prior authorization Key: [id number] Mounjaro 2.5mg /0.67ml auto injectors

## 2023-05-24 ENCOUNTER — Encounter: Payer: Self-pay | Admitting: Family Medicine

## 2023-05-24 ENCOUNTER — Other Ambulatory Visit: Payer: Self-pay | Admitting: Nephrology

## 2023-05-24 DIAGNOSIS — R809 Proteinuria, unspecified: Secondary | ICD-10-CM

## 2023-05-24 DIAGNOSIS — I129 Hypertensive chronic kidney disease with stage 1 through stage 4 chronic kidney disease, or unspecified chronic kidney disease: Secondary | ICD-10-CM

## 2023-05-24 DIAGNOSIS — D631 Anemia in chronic kidney disease: Secondary | ICD-10-CM

## 2023-05-24 DIAGNOSIS — N184 Chronic kidney disease, stage 4 (severe): Secondary | ICD-10-CM

## 2023-05-24 NOTE — Telephone Encounter (Signed)
Outcome Approved today by [employer] 2017 NCPDP Request Reference Number: [id number]. MOUNJARO INJ 2.5/0.5 is approved through 05/22/2024. Your patient may now fill this prescription and it will be covered. Authorization Expiration Date: 05/22/2024

## 2023-05-25 NOTE — Progress Notes (Signed)
Minimal plaque bilateral carotids, no significant stenosis/narrowing of carotids found, with no hemodynamic significance.

## 2023-05-31 ENCOUNTER — Ambulatory Visit
Admission: RE | Admit: 2023-05-31 | Discharge: 2023-05-31 | Disposition: A | Discharge status: Home / Self Care | Payer: BC Managed Care – PPO | Source: Ambulatory Visit | Attending: Nephrology | Admitting: Nephrology

## 2023-05-31 DIAGNOSIS — N184 Chronic kidney disease, stage 4 (severe): Secondary | ICD-10-CM | POA: Insufficient documentation

## 2023-05-31 DIAGNOSIS — R809 Proteinuria, unspecified: Secondary | ICD-10-CM | POA: Insufficient documentation

## 2023-05-31 DIAGNOSIS — I129 Hypertensive chronic kidney disease with stage 1 through stage 4 chronic kidney disease, or unspecified chronic kidney disease: Secondary | ICD-10-CM | POA: Diagnosis present

## 2023-05-31 DIAGNOSIS — D631 Anemia in chronic kidney disease: Secondary | ICD-10-CM | POA: Diagnosis present

## 2023-06-14 ENCOUNTER — Telehealth: Payer: Self-pay | Admitting: Family Medicine

## 2023-06-14 NOTE — Telephone Encounter (Signed)
 Lansoprazole  30 mg denied by patient's insurance.  The denial was based on our criteria for Lansoprazole  Cap 30mg  Dr.   Per your health plan's criteria, this drug is covered if you meet both of the following:  (I) Your doctor provides medical records (for example: chart notes) showing you cannot use (experienced intolerance, for example: allergy to excipient) the covered (formulary equivalent) drug with the same active ingredient:  (a) First-Lansoprazole *.  (II) There are paid claims or your doctor provides medical records (for example: chart notes) showing you have tried and failed at least two more covered (formulary equivalent) drugs within the same drug class. Covered drugs include:  (a) Esomeprazole  magnesium  or Nexium  pack. (b) First-Omeprazole or Omeprazole + SyrSpend SF [place]*.  The information provided does not show that you meet the criteria listed above.

## 2023-06-14 NOTE — Telephone Encounter (Signed)
 PA submitted in Cover My Meds for Lansoprazole 30MG     Sharifa Bucholz (Key: [id number]) PA Case ID #: [id number] Rx #: [id number]

## 2023-06-14 NOTE — Telephone Encounter (Signed)
 Covermymeds is requesting prior authorization Key: [id number] Lansoprazole 30MG  DR Capsules

## 2023-06-18 ENCOUNTER — Other Ambulatory Visit: Payer: Self-pay | Admitting: Family Medicine

## 2023-06-18 DIAGNOSIS — G2581 Restless legs syndrome: Secondary | ICD-10-CM

## 2023-06-19 NOTE — Telephone Encounter (Signed)
 Requested medication (s) are due for refill today - yes#60  Requested medication (s) are on the active medication list -yes  Future visit scheduled -yes  Last refill: 05/14/23  Notes to clinic: non delegated Rx  Requested Prescriptions  Pending Prescriptions Disp Refills   traMADol  (ULTRAM ) 50 MG tablet [Pharmacy Med Name: traMADol  HCl 50 MG Oral Tablet] 60 tablet 0    Sig: TAKE 1 TO 2 TABLETS BY MOUTH AT BEDTIME AS NEEDED     Not Delegated - Analgesics:  Opioid Agonists Failed - 06/19/2023  3:47 PM      Failed - This refill cannot be delegated      Failed - Urine Drug Screen completed in last 360 days      Passed - Valid encounter within last 3 months    Recent Outpatient Visits           1 month ago Postural lightheadedness   [place] [facility] [place], Curtis A, FNP   3 months ago Type 2 diabetes mellitus without complication, without long-term current use of insulin  (HCC)   [place] [facility] Simmons-Robinson, Kathryn, MD   6 months ago Essential (primary) hypertension   [facility] Simmons-Robinson, [place], MD   7 months ago Annual physical exam   [place] [facility] Simmons-Robinson, [place], MD   8 months ago Essential (primary) hypertension   [place] [facility] Simmons-Robinson, [place], MD       Future Appointments             In 2 days Simmons-Robinson, Rockie, MD [facility]   In 1 month Agbor-Etang, Redell, MD [facility]               Requested Prescriptions  Pending Prescriptions Disp Refills   traMADol  (ULTRAM ) 50 MG tablet [Pharmacy Med Name: traMADol  HCl 50 MG Oral Tablet] 60 tablet 0    Sig: TAKE 1 TO 2 TABLETS BY MOUTH AT BEDTIME AS NEEDED     Not Delegated - Analgesics:  Opioid Agonists Failed - 06/19/2023  3:47 PM      Failed - This refill cannot be delegated      Failed - Urine  Drug Screen completed in last 360 days      Passed - Valid encounter within last 3 months    Recent Outpatient Visits           1 month ago Postural lightheadedness   [facility] [place], Curtis A, FNP   3 months ago Type 2 diabetes mellitus without complication, without long-term current use of insulin  (HCC)   [place] [facility] Simmons-Robinson, [place], MD   6 months ago Essential (primary) hypertension   [place] [facility] Simmons-Robinson, [place], MD   7 months ago Annual physical exam   [place] [facility] Simmons-Robinson, [place], MD   8 months ago Essential (primary) hypertension    [facility] Simmons-Robinson, Financial [place], MD       Future Appointments             In 2 days Simmons-Robinson, Rockie, MD [facility]   In 1 month Agbor-Etang, Redell, MD [facility]

## 2023-06-21 ENCOUNTER — Ambulatory Visit: Payer: BC Managed Care – PPO | Admitting: Family Medicine

## 2023-06-21 ENCOUNTER — Encounter: Payer: Self-pay | Admitting: Family Medicine

## 2023-06-21 VITALS — BP 109/45 | HR 59 | Ht 59.0 in | Wt 134.0 lb

## 2023-06-21 DIAGNOSIS — E119 Type 2 diabetes mellitus without complications: Secondary | ICD-10-CM

## 2023-06-21 DIAGNOSIS — K2101 Gastro-esophageal reflux disease with esophagitis, with bleeding: Secondary | ICD-10-CM

## 2023-06-21 DIAGNOSIS — E1169 Type 2 diabetes mellitus with other specified complication: Secondary | ICD-10-CM

## 2023-06-21 DIAGNOSIS — I10 Essential (primary) hypertension: Secondary | ICD-10-CM

## 2023-06-21 DIAGNOSIS — Z7985 Long-term (current) use of injectable non-insulin antidiabetic drugs: Secondary | ICD-10-CM

## 2023-06-21 DIAGNOSIS — G2581 Restless legs syndrome: Secondary | ICD-10-CM

## 2023-06-21 DIAGNOSIS — R42 Dizziness and giddiness: Secondary | ICD-10-CM

## 2023-06-21 DIAGNOSIS — E785 Hyperlipidemia, unspecified: Secondary | ICD-10-CM

## 2023-06-21 DIAGNOSIS — N185 Chronic kidney disease, stage 5: Secondary | ICD-10-CM

## 2023-06-21 DIAGNOSIS — I120 Hypertensive chronic kidney disease with stage 5 chronic kidney disease or end stage renal disease: Secondary | ICD-10-CM

## 2023-06-21 MED ORDER — ESOMEPRAZOLE MAGNESIUM 40 MG PO CPDR: 40.0000 mg | DELAYED_RELEASE_CAPSULE | Freq: Every day | ORAL | 3 refills | Status: DC

## 2023-06-21 NOTE — Telephone Encounter (Signed)
Discussed with patient during visit.   Prescribed nexium for GERD

## 2023-06-21 NOTE — Progress Notes (Signed)
Established patient visit   Patient: Barbara Holmes   DOB: 05/04/1961   63 y.o. Female  MRN: [id number] Visit Date: 06/21/2023  Today's healthcare provider: Ronnald Ramp, MD   Chief Complaint  Patient presents with   Follow-up    HTN, DM T stated--still having dizziness couple time this week but has lessened from before    Subjective     HPI     Follow-up    Additional comments: HTN, DM T stated--still having dizziness couple time this week but has lessened from before       Last edited by Ronnald Ramp, MD on 06/22/2023 10:16 AM.       Discussed the use of AI scribe software for clinical note transcription with the patient, who gave verbal consent to proceed.  History of Present Illness   The patient, a 63 year old with a history of diabetes and hypertension, presents for a follow-up visit. She reports intermittent episodes of lightheadedness and near syncope, occurring both at work and at home. The episodes are not associated with palpitations or vertigo, but the patient describes a sensation of blood draining from her head. The frequency of these episodes has decreased since discontinuing Lisinopril and Metformin.  The patient's blood pressure readings at home have been variable, with diastolic readings occasionally dropping to the 40s and 50s. She also reports occasional hypotensive readings at work.  The patient's diabetes is well-controlled on Mounjaro 5mg , but there is a concern that the blood sugar may be dropping too low, contributing to the lightheadedness.  The patient also has a history of reflux, for which she was previously on Prevacid. However, her insurance no longer covers this medication, and she has not found relief with Nexium or Omeprazole.  The patient's kidney function has recently been a concern, with a sudden increase in creatinine and a decrease in GFR. She is currently under the care of a nephrologist, who has advised  her to discontinue Lisinopril and Metformin until further evaluation. The patient is due for a follow-up with the nephrologist in six days.  The patient is also scheduled to see a cardiologist next month for further evaluation of her lightheadedness and near syncope episodes.         Past Medical History:  Diagnosis Date   Diabetes mellitus without complication (HCC)    Hyperlipidemia    Hypertension    Restless leg syndrome     Medications: Outpatient Medications Prior to Visit  Medication Sig   albuterol (VENTOLIN HFA) 108 (90 Base) MCG/ACT inhaler Ventolin HFA 90 mcg/actuation aerosol inhaler  INHALE 2 PUFFS BY MOUTH EVERY 4 HOURS AS NEEDED FOR COUGH OR WHEEZING   Cholecalciferol 1000 units capsule Take 1,000 Units by mouth daily.    lansoprazole (PREVACID) 30 MG capsule Take 1 capsule (30 mg total) by mouth daily as needed.   meclizine (ANTIVERT) 25 MG tablet meclizine 25 mg tablet  TAKE 1 TABLET BY MOUTH TWICE A DAY AS NEEDED   Nutritional Supplements (FEEDING SUPPLEMENT, NEPRO CARB STEADY,) LIQD Take 237 mLs by mouth 2 (two) times daily between meals.   ondansetron (ZOFRAN) 4 MG tablet Take 4 mg by mouth every 8 (eight) hours as needed for vomiting or nausea.   rosuvastatin (CRESTOR) 40 MG tablet TAKE 1 TABLET BY MOUTH EVERY DAY   tirzepatide (MOUNJARO) 5 MG/0.5ML Pen Inject 5 mg into the skin once a week.   traMADol (ULTRAM) 50 MG tablet TAKE 1 TO 2 TABLETS BY MOUTH AT  BEDTIME AS NEEDED   [DISCONTINUED] lisinopril (ZESTRIL) 10 MG tablet Take 1 tablet (10 mg total) by mouth daily. (Patient not taking: Reported on 06/21/2023)   [DISCONTINUED] metFORMIN (GLUCOPHAGE-XR) 500 MG 24 hr tablet TAKE 2 TABLETS BY MOUTH EVERY DAY WITH BREAKFAST (Patient not taking: Reported on 06/21/2023)   No facility-administered medications prior to visit.    Review of Systems  Last metabolic panel Lab Results  Component Value Date   GLUCOSE 117 (H) 06/21/2023   NA 142 06/21/2023   K 3.9  06/21/2023   CL 101 06/21/2023   CO2 27 06/21/2023   BUN 17 06/21/2023   CREATININE 1.30 (H) 06/21/2023   EGFR 46 (L) 06/21/2023   CALCIUM 10.1 06/21/2023   PHOS 3.3 06/21/2023   PROT 7.3 05/17/2023   ALBUMIN 4.5 06/21/2023   LABGLOB 2.8 05/17/2023   AGRATIO 1.0 09/04/2022   BILITOT 0.3 05/17/2023   ALKPHOS 64 05/17/2023   AST 41 (H) 05/17/2023   ALT 32 05/17/2023   ANIONGAP 11 09/08/2022   Last hemoglobin A1c Lab Results  Component Value Date   HGBA1C 6.7 (H) 03/22/2023        Objective    BP (!) 109/45 (BP Location: Left Arm, Patient Position: Supine, Cuff Size: Normal)   Pulse (!) 59   Ht 4\' 11"  (1.499 m)   Wt 134 lb (60.8 kg)   SpO2 97%   BMI 27.06 kg/m  BP Readings from Last 3 Encounters:  05/17/23 127/70  03/21/23 119/64  12/19/22 103/72   Wt Readings from Last 3 Encounters:  06/21/23 134 lb (60.8 kg)  05/17/23 137 lb (62.1 kg)  03/21/23 157 lb 12.8 oz (71.6 kg)        Physical Exam  Physical Exam   CHEST: No abnormalities noted upon deep breaths. CARDIOVASCULAR: Heart sounds normal.       Results for orders placed or performed in visit on 06/21/23  Renal Function Panel  Result Value Ref Range   Glucose 117 (H) 70 - 99 mg/dL   BUN 17 8 - 27 mg/dL   Creatinine, Ser 4.09 (H) 0.57 - 1.00 mg/dL   eGFR 46 (L) >81 XB/JYN/8.29   BUN/Creatinine Ratio 13 12 - 28   Sodium 142 134 - 144 mmol/L   Potassium 3.9 3.5 - 5.2 mmol/L   Chloride 101 96 - 106 mmol/L   CO2 27 20 - 29 mmol/L   Calcium 10.1 8.7 - 10.3 mg/dL   Phosphorus 3.3 3.0 - 4.3 mg/dL   Albumin 4.5 3.9 - 4.9 g/dL    Assessment & Plan     Problem List Items Addressed This Visit       Cardiovascular and Mediastinum   Essential (primary) hypertension - Primary   Chronic Hypertension with episodes of lightheadedness and dizziness. Home blood pressure readings show systolic in the 100s and diastolic in the 40s-50s. Lisinopril was discontinued due to concerns about kidney function.  Scheduled nephrology evaluation on June 27, 2023. Discussed risks of untreated hypertension, including stroke and heart disease, and the importance of monitoring blood pressure. - Order renal function panel to check creatinine and GFR - Check blood pressure in the office after lab work - Follow up with nephrologist on June 27, 2023 - DC lisinopril 40mg  due to hypotension and dizziness       Relevant Orders   Renal Function Panel (Completed)     Digestive   Esophagitis, reflux   GERD previously managed with Prevacid, which is no longer covered by  insurance (see telephone encounter for PA denial) . No relief with Nexium or omeprazole in the past. Discussed potential benefits and risks of trying Nexium again and importance of symptom relief for quality of life. - Send prescription for Nexium 40mg  daily - Monitor for symptom relief and adjust treatment as needed      Relevant Medications   esomeprazole (NEXIUM) 40 MG capsule     Endocrine   Hyperlipidemia associated with type 2 diabetes mellitus (HCC)   Diabetes mellitus, type 2 (HCC)   Diabetes well controlled on Mounjaro 5 mg. Considering dose reduction due to potential contribution to lightheadedness. Discussed risks of hypoglycemia and benefits of maintaining blood sugar control. Considering reducing Mounjaro to 2.5 mg or adjusting frequency to every other week based on lab results and symptoms. - Consider reducing Mounjaro to 2.5 mg or adjusting frequency to every other week based on lab results and symptoms        Genitourinary   Hypertensive kidney disease with CKD (chronic kidney disease) stage V (HCC)   Chronic kidney disease with recent increase in creatinine to 2.11 and decrease in GFR to 26. Lisinopril was discontinued due to potential impact on kidney function. Scheduled nephrology evaluation on June 27, 2023. Discussed importance of kidney function monitoring and potential outcomes of untreated kidney disease. -  Order renal function panel to check creatinine and GFR - Follow up with nephrologist on June 27, 2023 - continue farxiga 10mg  daily, had to DC metformin due to GFR <30      Relevant Orders   Renal Function Panel (Completed)     Other   Postural lightheadedness   Persistent symptoms all though improved without blood pressure medications  Discontinued metformin and lisinopril 40mg   Encouraged oral hydration  Patient has upcoming appt with nephrology and has been referred to cardiology  Orthostatic vitals + today in office             No follow-ups on file.         Ronnald Ramp, MD  [facility] [phone] (phone) [phone] (fax)  [facility]

## 2023-06-21 NOTE — Telephone Encounter (Signed)
Please advise 

## 2023-06-22 ENCOUNTER — Telehealth: Payer: Self-pay | Admitting: Family Medicine

## 2023-06-22 DIAGNOSIS — I120 Hypertensive chronic kidney disease with stage 5 chronic kidney disease or end stage renal disease: Secondary | ICD-10-CM | POA: Insufficient documentation

## 2023-06-22 LAB — RENAL FUNCTION PANEL
Albumin: 4.5 g/dL (ref 3.9–4.9)
BUN/Creatinine Ratio: 13 (ref 12–28)
BUN: 17 mg/dL (ref 8–27)
CO2: 27 mmol/L (ref 20–29)
Calcium: 10.1 mg/dL (ref 8.7–10.3)
Chloride: 101 mmol/L (ref 96–106)
Creatinine, Ser: 1.3 mg/dL — ABNORMAL HIGH (ref 0.57–1.00)
Glucose: 117 mg/dL — ABNORMAL HIGH (ref 70–99)
Phosphorus: 3.3 mg/dL (ref 3.0–4.3)
Potassium: 3.9 mmol/L (ref 3.5–5.2)
Sodium: 142 mmol/L (ref 134–144)
eGFR: 46 mL/min/{1.73_m2} — ABNORMAL LOW (ref >59)

## 2023-06-22 NOTE — Telephone Encounter (Signed)
Covermymeds is requesting PA Key: [id number] Esomeprazole Magnesium 40 MG DR capsules

## 2023-06-22 NOTE — Assessment & Plan Note (Signed)
Chronic Hypertension with episodes of lightheadedness and dizziness. Home blood pressure readings show systolic in the 100s and diastolic in the 40s-50s. Lisinopril was discontinued due to concerns about kidney function. Scheduled nephrology evaluation on June 27, 2023. Discussed risks of untreated hypertension, including stroke and heart disease, and the importance of monitoring blood pressure. - Order renal function panel to check creatinine and GFR - Check blood pressure in the office after lab work - Follow up with nephrologist on June 27, 2023 - DC lisinopril 40mg  due to hypotension and dizziness

## 2023-06-22 NOTE — Assessment & Plan Note (Signed)
GERD previously managed with Prevacid, which is no longer covered by insurance (see telephone encounter for PA denial) . No relief with Nexium or omeprazole in the past. Discussed potential benefits and risks of trying Nexium again and importance of symptom relief for quality of life. - Send prescription for Nexium 40mg  daily - Monitor for symptom relief and adjust treatment as needed

## 2023-06-22 NOTE — Assessment & Plan Note (Signed)
Chronic kidney disease with recent increase in creatinine to 2.11 and decrease in GFR to 26. Lisinopril was discontinued due to potential impact on kidney function. Scheduled nephrology evaluation on June 27, 2023. Discussed importance of kidney function monitoring and potential outcomes of untreated kidney disease. - Order renal function panel to check creatinine and GFR - Follow up with nephrologist on June 27, 2023 - continue farxiga 10mg  daily, had to DC metformin due to GFR <30

## 2023-06-22 NOTE — Assessment & Plan Note (Signed)
Diabetes well controlled on Mounjaro 5 mg. Considering dose reduction due to potential contribution to lightheadedness. Discussed risks of hypoglycemia and benefits of maintaining blood sugar control. Considering reducing Mounjaro to 2.5 mg or adjusting frequency to every other week based on lab results and symptoms. - Consider reducing Mounjaro to 2.5 mg or adjusting frequency to every other week based on lab results and symptoms

## 2023-06-22 NOTE — Assessment & Plan Note (Signed)
Persistent symptoms all though improved without blood pressure medications  Discontinued metformin and lisinopril 40mg   Encouraged oral hydration  Patient has upcoming appt with nephrology and has been referred to cardiology  Orthostatic vitals + today in office

## 2023-06-27 NOTE — Telephone Encounter (Signed)
Start PA (Key: [id number]) - [id number] Esomeprazole Magnesium 40MG  dr capsules

## 2023-07-05 ENCOUNTER — Telehealth: Payer: Self-pay | Admitting: Family Medicine

## 2023-07-05 NOTE — Telephone Encounter (Signed)
PA started need to fax notes

## 2023-07-05 NOTE — Telephone Encounter (Signed)
Covermymeds is requesting prior authoriztion Key: [id number] Lansoprazole 30mg  DR Capsules

## 2023-07-06 NOTE — Telephone Encounter (Signed)
 PA sent to plan

## 2023-07-10 NOTE — Telephone Encounter (Signed)
 PA Case ID #: [id number] Rx #: [id number]  Outcome Lansoprazole  30MG  dr capsules Denied on February 3 by [employer] 2017 NCPDP Request Reference Number: [id number]. LANSOPRAZOLE  CAP 30MG  DR is denied for not meeting the prior authorization requirement(s). Details of this decision are in the notice attached below or have been faxed to you.

## 2023-07-24 ENCOUNTER — Ambulatory Visit: Payer: BC Managed Care – PPO | Attending: Cardiology | Admitting: Cardiology

## 2023-07-24 ENCOUNTER — Encounter: Payer: Self-pay | Admitting: Cardiology

## 2023-07-24 VITALS — BP 132/82 | HR 53 | Ht 59.0 in | Wt 134.6 lb

## 2023-07-24 DIAGNOSIS — I4510 Unspecified right bundle-branch block: Secondary | ICD-10-CM

## 2023-07-24 DIAGNOSIS — R42 Dizziness and giddiness: Secondary | ICD-10-CM

## 2023-07-24 NOTE — Patient Instructions (Signed)
 Medication Instructions:   Your physician recommends that you continue on your current medications as directed. Please refer to the Current Medication list given to you today.  *If you need a refill on your cardiac medications before your next appointment, please call your pharmacy*   Lab Work:  None Ordered  If you have labs (blood work) drawn today and your tests are completely normal, you will receive your results only by: MyChart Message (if you have MyChart) OR A paper copy in the mail If you have any lab test that is abnormal or we need to change your treatment, we will call you to review the results.   Testing/Procedures:  Your physician has requested that you have an echocardiogram. Echocardiography is a painless test that uses sound waves to create images of your heart. It provides your doctor with information about the size and shape of your heart and how well your heart's chambers and valves are working. This procedure takes approximately one hour. There are no restrictions for this procedure. Please do NOT wear cologne, perfume, aftershave, or lotions (deodorant is allowed). Please arrive 15 minutes prior to your appointment time.  Please note: We ask at that you not bring children with you during ultrasound (echo/ vascular) testing. Due to room size and safety concerns, children are not allowed in the ultrasound rooms during exams. Our front office staff cannot provide observation of children in our lobby area while testing is being conducted. An adult accompanying a patient to their appointment will only be allowed in the ultrasound room at the discretion of the ultrasound technician under special circumstances. We apologize for any inconvenience.    Follow-Up: At [facility], you and your health needs are our priority.  As part of our continuing mission to provide you with exceptional heart care, we have created designated Provider Care Teams.  These Care Teams  include your primary Cardiologist (physician) and Advanced Practice Providers (APPs -  Physician Assistants and Nurse Practitioners) who all work together to provide you with the care you need, when you need it.  We recommend signing up for the patient portal called "MyChart".  Sign up information is provided on this After Visit Summary.  MyChart is used to connect with patients for Virtual Visits (Telemedicine).  Patients are able to view lab/test results, encounter notes, upcoming appointments, etc.  Non-urgent messages can be sent to your provider as well.   To learn more about what you can do with MyChart, go to [email or web address].    Your next appointment:   2 - 3 month(s)  Provider:   You may see Debbe Odea, MD or one of the following Advanced Practice Providers on your designated Care Team:   Nicolasa Ducking, NP Eula Listen, PA-C Cadence Fransico Michael, PA-C Charlsie Quest, NP Carlos Levering, NP

## 2023-07-24 NOTE — Progress Notes (Signed)
Cardiology Office Note:    Date:  07/24/2023   ID:  Barbara Holmes, DOB May 04, 1961, MRN [id number]  PCP:  Ronnald Ramp, MD   [facility] Providers Cardiologist:  Debbe Odea, MD     Referring MD: Brett Albino*   Chief Complaint  Patient presents with   New Patient (Initial Visit)    Referred for cardiac evaluation of Postural lightheadedness.  Patient still having episodes of dizziness especially with bending/reaching down.  Orthostatic bp obtained today.   Barbara Holmes is a 63 y.o. female who is being seen today for the evaluation of dizziness at the request of Simmons-Robinson, [facility]*.   History of Present Illness:    Barbara Holmes is a 63 y.o. female with a hx of hyperlipidemia, hypertension, CKD presenting with dizziness and abnormal EKG.  Complaints of dizziness and lightheadedness when bending over.  She works at [employer], involved in [employer] for [employer].  Noticed dizziness and lightheadedness when she bends.  Walking, sitting from standing position, or abrupt head movements does not cause any symptoms.  Recently saw PCP 06/21/2023, systolics were in the low 100s, diastolics in the 40s to 50s.  Was previously on lisinopril 40 mg daily which was stopped due to hypotension and dizziness.  Symptoms overall have improved since stopping lisinopril although occasionally still present.  Had an EKG 05/17/2023 showing a right bundle branch block.  Denies syncope, denies palpitations, chest pain or shortness of breath.  Past Medical History:  Diagnosis Date   Chronic kidney disease 09/04/2022   Diabetes mellitus without complication (HCC)    Hyperlipidemia    Hypertension    Restless leg syndrome     Past Surgical History:  Procedure Laterality Date   ABDOMINAL HYSTERECTOMY     total   BREAST EXCISIONAL BIOPSY Right 1999   benign   COLONOSCOPY N/A 11/26/2020   Procedure: COLONOSCOPY;  Surgeon: Wyline Mood, MD;   Location: [facility] ENDOSCOPY;  Service: Gastroenterology;  Laterality: N/A;   FOOT SURGERY     LAPAROSCOPY     endometriosis    Current Medications: Current Meds  Medication Sig   albuterol (VENTOLIN HFA) 108 (90 Base) MCG/ACT inhaler Ventolin HFA 90 mcg/actuation aerosol inhaler  INHALE 2 PUFFS BY MOUTH EVERY 4 HOURS AS NEEDED FOR COUGH OR WHEEZING   Cholecalciferol 1000 units capsule Take 1,000 Units by mouth daily.    FARXIGA 10 MG TABS tablet Take 10 mg by mouth daily.   lansoprazole (PREVACID) 15 MG capsule Take 30 mg by mouth daily at 12 noon.   meclizine (ANTIVERT) 25 MG tablet meclizine 25 mg tablet  TAKE 1 TABLET BY MOUTH TWICE A DAY AS NEEDED   ondansetron (ZOFRAN) 4 MG tablet Take 4 mg by mouth every 8 (eight) hours as needed for vomiting or nausea.   rosuvastatin (CRESTOR) 40 MG tablet TAKE 1 TABLET BY MOUTH EVERY DAY   tirzepatide (MOUNJARO) 5 MG/0.5ML Pen Inject 5 mg into the skin once a week.   traMADol (ULTRAM) 50 MG tablet TAKE 1 TO 2 TABLETS BY MOUTH AT BEDTIME AS NEEDED     Allergies:   Patient has no known allergies.   Social History   Socioeconomic History   Marital status: Divorced    Spouse name: Not on file   Number of children: Not on file   Years of education: Not on file   Highest education level: GED or equivalent  Occupational History   Not on file  Tobacco Use  Smoking status: Former    Current packs/day: 0.00    Types: Cigarettes    Start date: 06/05/1985    Quit date: 06/15/1999    Years since quitting: 24.1   Smokeless tobacco: Never   Tobacco comments:    1 cigarette per week  Vaping Use   Vaping status: Never Used  Substance and Sexual Activity   Alcohol use: No   Drug use: No   Sexual activity: Yes    Birth control/protection: None  Other Topics Concern   Not on file  Social History Narrative   Not on file   Social Drivers of Health   Financial Resource Strain: Low Risk  (06/19/2023)   Overall Financial Resource Strain (CARDIA)     Difficulty of Paying Living Expenses: Not hard at all  Food Insecurity: No Food Insecurity (06/19/2023)   Hunger Vital Sign    Worried About Running Out of Food in the Last Year: Never true    Ran Out of Food in the Last Year: Never true  Transportation Needs: No Transportation Needs (06/19/2023)   PRAPARE - Administrator, Civil Service (Medical): No    Lack of Transportation (Non-Medical): No  Physical Activity: Sufficiently Active (06/19/2023)   Exercise Vital Sign    Days of Exercise per Week: 5 days    Minutes of Exercise per Session: 150+ min  Stress: No Stress Concern Present (06/19/2023)   [employer] of Occupational Health - Occupational Stress Questionnaire    Feeling of Stress : Not at all  Social Connections: Socially Isolated (06/19/2023)   Social Connection and Isolation Panel [NHANES]    Frequency of Communication with Friends and Family: Once a week    Frequency of Social Gatherings with Friends and Family: Once a week    Attends Religious Services: 1 to 4 times per year    Active Member of [employer] or Organizations: No    Attends Engineer, structural: Not on file    Marital Status: Divorced     Family History: The patient's family history includes Asthma in her father; Breast cancer (age of onset: 77) in her maternal aunt; Dementia in her mother; Diabetes in her maternal grandmother, mother, and sister; Heart attack in her father; Hyperlipidemia in her father, mother, and sister; Hypertension in her father, mother, and sister.  ROS:   Please see the history of present illness.     All other systems reviewed and are negative.  EKGs/Labs/Other Studies Reviewed:    The following studies were reviewed today:  EKG Interpretation Date/Time:  Tuesday July 24 2023 08:26:38 EST Ventricular Rate:  53 PR Interval:  144 QRS Duration:  134 QT Interval:  414 QTC Calculation: 388 R Axis:   -37  Text Interpretation: Sinus bradycardia Left axis  deviation Right bundle branch block Confirmed by Debbe Odea ([id number]) on 07/24/2023 8:35:22 AM    Recent Labs: 05/17/2023: ALT 32; Hemoglobin 11.8; Platelets 209; TSH 0.401 06/21/2023: BUN 17; Creatinine, Ser 1.30; Potassium 3.9; Sodium 142  Recent Lipid Panel    Component Value Date/Time   CHOL 123 11/07/2022 0918   TRIG 111 11/07/2022 0918   HDL 46 11/07/2022 0918   CHOLHDL 2.7 11/07/2022 0918   LDLCALC 57 11/07/2022 0918     Risk Assessment/Calculations:           Physical Exam:    VS:  BP 132/82 (BP Location: Left Arm, Patient Position: Sitting, Cuff Size: Normal)   Pulse (!) 53   Ht 4'  11" (1.499 m)   Wt 134 lb 9.6 oz (61.1 kg)   SpO2 99%   BMI 27.19 kg/m     Wt Readings from Last 3 Encounters:  07/24/23 134 lb 9.6 oz (61.1 kg)  06/21/23 134 lb (60.8 kg)  05/17/23 137 lb (62.1 kg)     GEN:  Well nourished, well developed in no acute distress HEENT: Normal NECK: No JVD; No carotid bruits CARDIAC: RRR, no murmurs, rubs, gallops RESPIRATORY:  Clear to auscultation without rales, wheezing or rhonchi  ABDOMEN: Soft, non-tender, non-distended MUSCULOSKELETAL:  No edema; No deformity  SKIN: Warm and dry NEUROLOGIC:  Alert and oriented x 3 PSYCHIATRIC:  Normal affect   ASSESSMENT:    1. Postural lightheadedness   2. RBBB    PLAN:    In order of problems listed above:  Postural dizziness, orthostatic vitals today with no episode for orthostasis.  Symptoms overall improved since stopping lisinopril.  Etiology appears noncardiac.  Right bundle branch block will not cause symptoms.  Positional vertigo worsened by hypotension likely etiology. Right bundle branch block, no indication for pacemaker.  Obtain echo to rule out any structural abnormalities.  Follow-up after echo.     Medication Adjustments/Labs and Tests Ordered: Current medicines are reviewed at length with the patient today.  Concerns regarding medicines are outlined above.  Orders Placed This  Encounter  Procedures   EKG 12-Lead   ECHOCARDIOGRAM COMPLETE   No orders of the defined types were placed in this encounter.   Patient Instructions  Medication Instructions:   Your physician recommends that you continue on your current medications as directed. Please refer to the Current Medication list given to you today.  *If you need a refill on your cardiac medications before your next appointment, please call your pharmacy*   Lab Work:  None Ordered  If you have labs (blood work) drawn today and your tests are completely normal, you will receive your results only by: MyChart Message (if you have MyChart) OR A paper copy in the mail If you have any lab test that is abnormal or we need to change your treatment, we will call you to review the results.   Testing/Procedures:  Your physician has requested that you have an echocardiogram. Echocardiography is a painless test that uses sound waves to create images of your heart. It provides your doctor with information about the size and shape of your heart and how well your heart's chambers and valves are working. This procedure takes approximately one hour. There are no restrictions for this procedure. Please do NOT wear cologne, perfume, aftershave, or lotions (deodorant is allowed). Please arrive 15 minutes prior to your appointment time.  Please note: We ask at that you not bring children with you during ultrasound (echo/ vascular) testing. Due to room size and safety concerns, children are not allowed in the ultrasound rooms during exams. Our front office staff cannot provide observation of children in our lobby area while testing is being conducted. An adult accompanying a patient to their appointment will only be allowed in the ultrasound room at the discretion of the ultrasound technician under special circumstances. We apologize for any inconvenience.    Follow-Up: At [facility], you and your health needs are  our priority.  As part of our continuing mission to provide you with exceptional heart care, we have created designated Provider Care Teams.  These Care Teams include your primary Cardiologist (physician) and Advanced Practice Providers (APPs -  Physician Assistants and  Nurse Practitioners) who all work together to provide you with the care you need, when you need it.  We recommend signing up for the patient portal called "MyChart".  Sign up information is provided on this After Visit Summary.  MyChart is used to connect with patients for Virtual Visits (Telemedicine).  Patients are able to view lab/test results, encounter notes, upcoming appointments, etc.  Non-urgent messages can be sent to your provider as well.   To learn more about what you can do with MyChart, go to [email or web address].    Your next appointment:   2 - 3  month(s)  Provider:   You may see Debbe Odea, MD or one of the following Advanced Practice Providers on your designated Care Team:   Nicolasa Ducking, NP Eula Listen, PA-C Cadence Fransico Michael, PA-C Charlsie Quest, NP Carlos Levering, NP    Signed, Debbe Odea, MD  07/24/2023 9:15 AM    [facility]

## 2023-07-29 ENCOUNTER — Other Ambulatory Visit: Payer: Self-pay | Admitting: Family Medicine

## 2023-07-29 DIAGNOSIS — G2581 Restless legs syndrome: Secondary | ICD-10-CM

## 2023-08-14 ENCOUNTER — Ambulatory Visit: Payer: BC Managed Care – PPO | Attending: Cardiology

## 2023-08-14 DIAGNOSIS — R42 Dizziness and giddiness: Secondary | ICD-10-CM

## 2023-08-14 DIAGNOSIS — I4510 Unspecified right bundle-branch block: Secondary | ICD-10-CM

## 2023-08-14 LAB — ECHOCARDIOGRAM COMPLETE
AV Mean grad: 3 mmHg
AV Peak grad: 5.8 mmHg
Ao pk vel: 1.2 m/s
Area-P 1/2: 3.37 cm2
S' Lateral: 2.8 cm

## 2023-08-24 ENCOUNTER — Other Ambulatory Visit: Payer: Self-pay | Admitting: Family Medicine

## 2023-08-24 DIAGNOSIS — G2581 Restless legs syndrome: Secondary | ICD-10-CM

## 2023-08-27 NOTE — Telephone Encounter (Signed)
 Requested medications are due for refill today.  yes  Requested medications are on the active medications list.  yes  Last refill. 07/30/2023 #60 0 rf  Future visit scheduled.   no  Notes to clinic.  Refill not delegated.    Requested Prescriptions  Pending Prescriptions Disp Refills   traMADol (ULTRAM) 50 MG tablet [Pharmacy Med Name: traMADol HCl 50 MG Oral Tablet] 60 tablet 0    Sig: TAKE 1 TO 2 TABLETS BY MOUTH AT BEDTIME AS NEEDED     Not Delegated - Analgesics:  Opioid Agonists Failed - 08/27/2023 12:37 PM      Failed - This refill cannot be delegated      Failed - Urine Drug Screen completed in last 360 days      Passed - Valid encounter within last 3 months    Recent Outpatient Visits           2 months ago Essential (primary) hypertension   [place] [facility] Simmons-Robinson, [place], MD   3 months ago Postural lightheadedness   [place] [facility] [place], [place] A, FNP   5 months ago Type 2 diabetes mellitus without complication, without long-term current use of insulin (HCC)   [place] [facility] Simmons-Robinson, [place], MD   8 months ago Essential (primary) hypertension   [place] [facility] Simmons-Robinson, [place], MD   9 months ago Annual physical exam   [place] [facility] Simmons-Robinson, [place], MD       Future Appointments             In 3 weeks [place], Gavin Pound, NP [facility]

## 2023-09-21 ENCOUNTER — Ambulatory Visit: Payer: BC Managed Care – PPO | Admitting: Student

## 2023-10-03 ENCOUNTER — Other Ambulatory Visit: Payer: Self-pay | Admitting: Family Medicine

## 2023-10-03 DIAGNOSIS — G2581 Restless legs syndrome: Secondary | ICD-10-CM

## 2023-10-04 ENCOUNTER — Telehealth: Payer: Self-pay

## 2023-10-04 ENCOUNTER — Other Ambulatory Visit (HOSPITAL_COMMUNITY): Payer: Self-pay

## 2023-10-04 DIAGNOSIS — K2101 Gastro-esophageal reflux disease with esophagitis, with bleeding: Secondary | ICD-10-CM

## 2023-10-04 NOTE — Telephone Encounter (Signed)
 Pharmacy Patient Advocate Encounter   Received notification from Onbase that prior authorization for Mounjaro  5MG /0.5ML auto-injectors is required/requested.   Insurance verification completed.   The patient is insured through [facility] .   Per test claim: PA required; PA submitted to above mentioned insurance via CoverMyMeds Key/confirmation #/[id number] Status is pending

## 2023-10-04 NOTE — Telephone Encounter (Signed)
 Pharmacy Patient Advocate Encounter   Received notification from Onbase that prior authorization for Esomeprazole  Magnesium  40MG  dr capsules is required/requested.   Insurance verification completed.   The patient is insured through [employer] .   Per test claim:  Lansoprazole , Omeprazole or Pantoprazole is preferred by the insurance.  If suggested medication is appropriate, Please send in a new RX and discontinue this one. If not, please advise as to why it's not appropriate so that we may request a Prior Authorization. Please note, some preferred medications may still require a PA.  If the suggested medications have not been trialed and there are no contraindications to their use, the PA will not be submitted, as it will not be approved.

## 2023-10-05 NOTE — Telephone Encounter (Signed)
 Please see the message below.

## 2023-10-08 ENCOUNTER — Other Ambulatory Visit (HOSPITAL_COMMUNITY): Payer: Self-pay

## 2023-10-08 MED ORDER — LANSOPRAZOLE 30 MG PO CPDR: 30.0000 mg | DELAYED_RELEASE_CAPSULE | Freq: Every day | ORAL | 3 refills | Status: AC

## 2023-10-08 NOTE — Telephone Encounter (Signed)
 Pharmacy Patient Advocate Encounter  Received notification from [facility] that Prior Authorization for Mounjaro  5MG /0.5ML auto-injectors has been APPROVED from 10/08/23 to 10/07/24. Ran test claim, Copay is $25. This test claim was processed through [facility]- copay amounts may vary at other pharmacies due to pharmacy/plan contracts, or as the patient moves through the different stages of their insurance plan.   PA #/Case ID/Reference #: [id number]

## 2023-10-08 NOTE — Telephone Encounter (Signed)
Please see the message below and advise.

## 2023-10-08 NOTE — Telephone Encounter (Signed)
 Please start prior authorization to request lansoprazole  due to effectiveness for symptom management compared to other agents   Note from clinical visit 06/21/23 GERD previously managed with Prevacid , which is no longer covered by insurance (see telephone encounter for PA denial) . No relief with Nexium  or omeprazole in the past. Discussed potential benefits and risks of trying Nexium  again and importance of symptom relief for quality of life.

## 2023-10-08 NOTE — Addendum Note (Signed)
 Addended by: SIMMONS-ROBINSON, Vianna Venezia L on: 10/08/2023 11:40 AM   Modules accepted: Orders

## 2023-10-08 NOTE — Telephone Encounter (Signed)
 Lansoprazole  does not require a prior auth. She can go to her pharmacy and pick it up as long as you have called it in. Her copay is $9 for a 90 day supply.

## 2023-10-11 ENCOUNTER — Encounter: Payer: Self-pay | Admitting: Family Medicine

## 2023-10-12 ENCOUNTER — Telehealth: Payer: Self-pay | Admitting: Pharmacy Technician

## 2023-10-12 ENCOUNTER — Other Ambulatory Visit (HOSPITAL_COMMUNITY): Payer: Self-pay

## 2023-10-12 NOTE — Telephone Encounter (Signed)
 Pharmacy Patient Advocate Encounter  Received notification from [facility] that Prior Authorization for traMADol  HCl 50MG  tablets has been CANCELLED due to   PA #/Case ID/Reference #: Key: [id number]  Requires patient's pharmacy to enter in a prior authorization code when processed. Pharmacy to contact the pharmacy helpdesk if they need assistance in processing the claim. Ran test claim and current 30 day copay is $3.32.

## 2023-10-12 NOTE — Telephone Encounter (Signed)
 Please see the message below.

## 2023-10-12 NOTE — Telephone Encounter (Signed)
 Pharmacy Patient Advocate Encounter   Received notification from Patient Advice Request messages that prior authorization for traMADol  HCl 50MG  tablets is required/requested.   Insurance verification completed.   The patient is insured through [facility] .   Per test claim: PA required; PA started via CoverMyMeds. KEY [id number] . Waiting for clinical questions to populate.

## 2023-10-12 NOTE — Telephone Encounter (Signed)
 PA request has been Started. New Encounter has been or will be created for follow up. For additional info see Pharmacy Prior Auth telephone encounter from 10/12/2023.

## 2023-10-15 ENCOUNTER — Encounter: Payer: Self-pay | Admitting: Family Medicine

## 2023-10-17 ENCOUNTER — Encounter: Payer: Self-pay | Admitting: Family Medicine

## 2023-10-17 ENCOUNTER — Ambulatory Visit: Payer: Self-pay

## 2023-10-17 DIAGNOSIS — Z113 Encounter for screening for infections with a predominantly sexual mode of transmission: Secondary | ICD-10-CM

## 2023-10-17 LAB — WET PREP FOR TRICH, YEAST, CLUE
Clue Cell Exam: NEGATIVE
Trichomonas Exam: NEGATIVE
Yeast Exam: NEGATIVE

## 2023-10-17 LAB — HM HIV SCREENING LAB: HM HIV Screening: NEGATIVE

## 2023-10-17 NOTE — Progress Notes (Unsigned)
 Cardiology Clinic Note   Date: 10/19/2023 ID: Barbara Holmes, DOB 12-29-1960, MRN [id number]  Primary Cardiologist:  Constancia Delton, MD  Chief Complaint   Barbara Holmes is a 63 y.o. female who presents to the clinic today for follow up after testing.   Patient Profile   Barbara Holmes is followed by Dr. Junnie Olives for the history outlined below.      Past medical history significant for: Orthostatic lightheadedness. RBBB. Echo 08/14/2023: EF 60 to 65%.  No RWMA.  Grade I DD.  Normal global strain.  Normal RV size/function.  Normal PA pressure, RVSP 26.3 mmHg.  No significant valvular abnormalities. Hypertension. Hyperlipidemia. Esophagitis. T2DM. CKD stage IIIb.  In summary, patient was first evaluated by Dr. Junnie Olives on 07/24/2023 for dizziness at the request of Dr. Branson Calandra.  Patient reported dizziness and lightheadedness when bending over particularly when working at [employer].  She had no symptoms with walking, stand to sit or abrupt head movements.  She was evaluated by PCP in January 2025 and BP was noted to be in the low 100s/40-50 and lisinopril  was stopped.  She reported some improvement in symptoms since stopping lisinopril .  EKG from December 2024 was reviewed and demonstrated RBBB.  Orthostatic vitals were negative.  She underwent echo as detailed above.     History of Present Illness    Today, patient is accompanied by her husband. She is doing well. She retired from [employer] in March. Since that time she has only had 2-3 episodes of lightheadedness associated with bending over at the waist. Patient denies shortness of breath, dyspnea on exertion, lower extremity edema, orthopnea or PND. No chest pain, pressure, or tightness. No palpitations. She is staying active since retirement fishing and doing tasks around her home. Results of echo discussed in detail and all questions answered.     ROS: All other systems reviewed and are otherwise negative  except as noted in History of Present Illness.  EKGs/Labs Reviewed       EKG is not performed today.   05/17/2023: ALT 32; AST 41 06/21/2023: BUN 17; Creatinine, Ser 1.30; Potassium 3.9; Sodium 142   05/17/2023: Hemoglobin 11.8; WBC 7.0   05/17/2023: TSH 0.401    Physical Exam    VS:  BP 120/70 (BP Location: Left Arm, Patient Position: Sitting, Cuff Size: Normal)   Pulse 60   Resp 16   Ht 4\' 11"  (1.499 m)   Wt 136 lb 9.6 oz (62 kg)   SpO2 96%   BMI 27.59 kg/m  , BMI Body mass index is 27.59 kg/m.  GEN: Well nourished, well developed, in no acute distress. Neck: No JVD or carotid bruits. Cardiac:  RRR. No murmurs. No rubs or gallops.   Respiratory:  Respirations regular and unlabored. Clear to auscultation without rales, wheezing or rhonchi. GI: Soft, nontender, nondistended. Extremities: Radials/DP/PT 2+ and equal bilaterally. No clubbing or cyanosis. No edema   Skin: Warm and dry, no rash. Neuro: Strength intact.  Assessment & Plan   RBBB Echo March 2025 demonstrated normal LV/RV function, Grade I DD, no significant valvular abnormalities.  Patient denies chest pain, dyspnea, palpitations or syncope. She has occasional episodes of lightheadedness associated with bending at the waist.  - No further testing indicated at this time.  Orthostatic lightheadedness Patient reports since retiring from [employer] in March she has only had 2-3 episodes of lightheadedness associated with bending at the waist. She is staying well hydrated. Discussed not dropping her head below her knees and  slow position changes.  - Continue adequate hydration and slow position changes.  Can add compression if symptoms return or worsen.  Disposition: Patient would like to return as needed.          Signed, Lonell Rives. Barbara Rotenberg, DNP, NP-C

## 2023-10-17 NOTE — Progress Notes (Signed)
 Pt is here for STD screening. Wet prep results reviewed with patient and requires no treatment. Condoms declined. Austine Lefort, RN.

## 2023-10-17 NOTE — Progress Notes (Signed)
 [facility] Department STI clinic [address] [place] [address] Main phone: [phone]  STI screening visit  Subjective:  Barbara Holmes is a 63 y.o. female being seen today for an STI screening visit. The patient reports they do not have symptoms.  Patient reports that they do not desire a pregnancy in the next year.   They reported they are not interested in discussing contraception today.    No LMP recorded. Patient has had a hysterectomy.  Patient has the following medical conditions:  Patient Active Problem List   Diagnosis Date Noted   Hypertensive kidney disease with CKD (chronic kidney disease) stage V (HCC) 06/22/2023   Chronic kidney disease, stage 3b (HCC) 05/23/2023   Postural lightheadedness 05/17/2023   Constipation 05/17/2023   Cervical myelopathy (HCC) 11/07/2022   Class 2 severe obesity due to excess calories with serious comorbidity and body mass index (BMI) of 36.0 to 36.9 in adult (HCC) 11/07/2022   Annual physical exam 11/07/2022   Anemia in chronic kidney disease 09/21/2022   Diabetes mellitus, type 2 (HCC) 09/01/2015   Arthritis 07/12/2015   Lesion of ulnar nerve 07/12/2015   Restless leg 09/21/2009   Allergic rhinitis 08/07/2009   Avitaminosis D 04/05/2009   Essential (primary) hypertension 11/04/2007   Hyperlipidemia associated with type 2 diabetes mellitus (HCC) 11/04/2007   Esophagitis, reflux 04/15/2007   Chief Complaint  Patient presents with   SEXUALLY TRANSMITTED DISEASE    HPI Patient reports to clinic for STI Testing.Asymptomatic  Does the patient using douching products? No  See flowsheet for further details and programmatic requirements Hyperlink available at the top of the signed note in blue.  Flow sheet content below:  Pregnancy Intention Screening Does the patient want to become pregnant in the next year?: No Does the patient's partner want to become pregnant in the next year?:  No Would the patient like to discuss contraceptive options today?: No Reason For STD Screen STD Screening: Is asymptomatic Have you ever had an STD?: No History of Antibiotic use in the past 2 weeks?: No STD Symptoms Denies all: Yes Risk Factors for Hep B Household, sexual, or needle sharing contact of a person infected with Hep B: No Sexual contact with a person who uses drugs not as prescribed?: No Currently or Ever used drugs not as prescribed: No HIV Positive: No PRep Patient: No Men who have sex with men: N/A Have Hepatitis C: No History of Incarceration: No History of Homeslessness?: No Anal sex following anal drug use?: No Risk Factors for Hep C Currently using drugs not as prescribed: No Sexual partner(s) currently using drugs as not prescribed: No History of drug use: No HIV Positive: No People with a history of incarceration: No People born between the years of 46 and 38: No Abuse History Has patient ever been abused physically?: No Has patient ever been abused sexually?: No Does patient feel they have a problem with Anxiety?: No Does patient feel they have a problem with Depression?: No Counseling Patient counseled to use condoms with all sex: Condoms declined RTC in 2-3 weeks for test results: Yes Clinic will call if test results abnormal before test result appt.: Yes Test results given to patient Patient counseled to use condoms with all sex: Condoms declined   Screening for MPX risk: Does the patient have an unexplained rash? No Is the patient MSM? No Does the patient endorse multiple sex partners or anonymous sex partners? No Did the patient have close  or sexual contact with a person diagnosed with MPX? No Has the patient traveled outside the [place]  where MPX is endemic? No Is there a high clinical suspicion for MPX-- evidenced by one of the following No  -Unlikely to be chickenpox  -Lymphadenopathy  -Rash that present in same phase of evolution on any  given body part  Screenings: Last HIV test per patient/review of record was No results found for: "HMHIVSCREEN"  Lab Results  Component Value Date   HIV Non Reactive 09/04/2022     Last HEPC test per patient/review of record was No results found for: "HMHEPCSCREEN" No components found for: "HEPC"   Last HEPB test per patient/review of record was No components found for: "HMHEPBSCREEN"   Patient reports last pap was:   Lab Results  Component Value Date   SPECADGYN Comment 09/26/2017   Result Date Procedure Results Follow-ups  09/26/2017 Pap IG (Image Guided) DIAGNOSIS:: Comment Specimen adequacy:: Comment Clinician Provided ICD10: Comment Performed by:: Comment PAP Smear Comment: . Note:: Comment Test Methodology: Comment     Immunization history:  Immunization History  Administered Date(s) Administered   Influenza Whole 03/14/2017   Influenza, Quadrivalent, Recombinant, Inj, Pf 03/08/2019   Influenza, Seasonal, Injecte, Preservative Fre 03/21/2023   Influenza,inj,Quad PF,6+ Mos 04/03/2018, 04/26/2021, 05/03/2022   Influenza-Unspecified 03/11/2015, 01/31/2020   Moderna Covid-19 Vaccine Bivalent Booster 12yrs & up 08/02/2020   Moderna Sars-Covid-2 Vaccination 12/30/2019, 01/27/2020   Pneumococcal Polysaccharide-23 04/03/2016   Td 04/13/2003   Tdap 08/01/2012, 11/07/2022   Zoster Recombinant(Shingrix ) 04/26/2021, 10/26/2021    The following portions of the patient's history were reviewed and updated as appropriate: allergies, current medications, past medical history, past social history, past surgical history and problem list.  Objective:  There were no vitals filed for this visit.  Physical Exam Vitals and nursing note reviewed.  Constitutional:      Appearance: Normal appearance.  HENT:     Head: Normocephalic.     Mouth/Throat:     Mouth: Mucous membranes are moist.  Cardiovascular:     Rate and Rhythm: Normal rate.  Pulmonary:     Effort: Pulmonary effort  is normal.  Abdominal:     Palpations: Abdomen is soft.  Genitourinary:    Comments: Declined genital exam- no symptoms, self swabbed Musculoskeletal:        General: Normal range of motion.  Lymphadenopathy:     Head:     Right side of head: No submandibular, preauricular or posterior auricular adenopathy.     Left side of head: No submandibular, preauricular or posterior auricular adenopathy.     Cervical: No cervical adenopathy.     Upper Body:     Right upper body: No supraclavicular or axillary adenopathy.     Left upper body: No supraclavicular or axillary adenopathy.  Skin:    General: Skin is warm and dry.  Neurological:     Mental Status: She is alert and oriented to person, place, and time.  Psychiatric:        Mood and Affect: Mood normal.     Assessment and Plan:  Barbara Holmes is a 63 y.o. female presenting to the [facility] for STI screening  1. Screening for venereal disease (Primary)  - Chlamydia/Gonorrhea [place] [facility] - HIV [facility] - Syphilis Serology, [facility] - WET PREP FOR TRICH, YEAST, CLUE   Patient accepted the following screenings: vaginal CT/GC swabs, vaginal wet prep, HIV, and RPR Patient meets criteria for HepB  screening? No. Ordered? not applicable Patient meets criteria for HepC screening? No. Ordered? not applicable  Treat wet prep per standing order Discussed time line for [facility] results and that patient will be called with positive results and encouraged patient to call if she had not heard in 2 weeks.  Counseled to return or seek care for continued or worsening symptoms Recommended repeat testing in 3 months with positive results. Recommended condom use with all sex for STI prevention.   Patient is currently using postmenopausal to prevent pregnancy.    Return if symptoms worsen or fail to improve, for STI screening.  Future Appointments  Date Time Provider Department Center  10/17/2023  2:40 PM  Earleen Glazier, FNP [facility] None  10/19/2023  8:50 AM Morey Ar, NP [facility] None  11/12/2023  8:20 AM Simmons-Robinson, Judyann Number, MD [facility]    Earleen Glazier, [place]

## 2023-10-19 ENCOUNTER — Ambulatory Visit: Attending: Student | Admitting: Student

## 2023-10-19 ENCOUNTER — Encounter: Payer: Self-pay | Admitting: Student

## 2023-10-19 VITALS — BP 120/70 | HR 60 | Resp 16 | Ht 59.0 in | Wt 136.6 lb

## 2023-10-19 DIAGNOSIS — R42 Dizziness and giddiness: Secondary | ICD-10-CM

## 2023-10-19 DIAGNOSIS — I4510 Unspecified right bundle-branch block: Secondary | ICD-10-CM | POA: Diagnosis not present

## 2023-10-19 NOTE — Patient Instructions (Signed)
 Medication Instructions:  No changes at this time.   *If you need a refill on your cardiac medications before your next appointment, please call your pharmacy*  Lab Work: None  If you have labs (blood work) drawn today and your tests are completely normal, you will receive your results only by: MyChart Message (if you have MyChart) OR A paper copy in the mail If you have any lab test that is abnormal or we need to change your treatment, we will call you to review the results.  Testing/Procedures: None  Follow-Up: At [facility], you and your health needs are our priority.  As part of our continuing mission to provide you with exceptional heart care, our providers are all part of one team.  This team includes your primary Cardiologist (physician) and Advanced Practice Providers or APPs (Physician Assistants and Nurse Practitioners) who all work together to provide you with the care you need, when you need it.  Your next appointment:   Follow up as needed.

## 2023-11-04 ENCOUNTER — Other Ambulatory Visit: Payer: Self-pay | Admitting: Family Medicine

## 2023-11-04 DIAGNOSIS — G2581 Restless legs syndrome: Secondary | ICD-10-CM

## 2023-11-12 ENCOUNTER — Ambulatory Visit: Admitting: Family Medicine

## 2023-11-12 ENCOUNTER — Encounter: Payer: Self-pay | Admitting: Family Medicine

## 2023-11-12 VITALS — BP 130/65 | HR 55 | Ht 59.0 in | Wt 136.0 lb

## 2023-11-12 DIAGNOSIS — E119 Type 2 diabetes mellitus without complications: Secondary | ICD-10-CM

## 2023-11-12 DIAGNOSIS — R110 Nausea: Secondary | ICD-10-CM

## 2023-11-12 DIAGNOSIS — N1832 Chronic kidney disease, stage 3b: Secondary | ICD-10-CM

## 2023-11-12 DIAGNOSIS — Z0001 Encounter for general adult medical examination with abnormal findings: Secondary | ICD-10-CM | POA: Diagnosis not present

## 2023-11-12 DIAGNOSIS — Z7984 Long term (current) use of oral hypoglycemic drugs: Secondary | ICD-10-CM

## 2023-11-12 DIAGNOSIS — D631 Anemia in chronic kidney disease: Secondary | ICD-10-CM

## 2023-11-12 DIAGNOSIS — R42 Dizziness and giddiness: Secondary | ICD-10-CM

## 2023-11-12 DIAGNOSIS — I10 Essential (primary) hypertension: Secondary | ICD-10-CM

## 2023-11-12 DIAGNOSIS — E1169 Type 2 diabetes mellitus with other specified complication: Secondary | ICD-10-CM | POA: Diagnosis not present

## 2023-11-12 DIAGNOSIS — Z23 Encounter for immunization: Secondary | ICD-10-CM

## 2023-11-12 DIAGNOSIS — Z1231 Encounter for screening mammogram for malignant neoplasm of breast: Secondary | ICD-10-CM

## 2023-11-12 DIAGNOSIS — Z0000 Encounter for general adult medical examination without abnormal findings: Secondary | ICD-10-CM

## 2023-11-12 DIAGNOSIS — E785 Hyperlipidemia, unspecified: Secondary | ICD-10-CM

## 2023-11-12 DIAGNOSIS — E559 Vitamin D deficiency, unspecified: Secondary | ICD-10-CM

## 2023-11-12 MED ORDER — MECLIZINE HCL 25 MG PO TABS: 25.0000 mg | ORAL_TABLET | Freq: Three times a day (TID) | ORAL | 2 refills | Status: AC | PRN

## 2023-11-12 MED ORDER — ONDANSETRON HCL 4 MG PO TABS: 4.0000 mg | ORAL_TABLET | Freq: Three times a day (TID) | ORAL | 2 refills | Status: DC | PRN

## 2023-11-12 NOTE — Assessment & Plan Note (Addendum)
 DM foot exam completed today, DM eye exam was lat in 05/2023  Urine albumin collected  Lipid panel ordered  UTD on vaccines including Shingrix , COVID and pneumococcal  Last colon CA screening completed, 2022 N/A pap candidate, s/p total hysterectomy  Mammogram ordered for breast cancer screening  H/O recommending of exercise per week and well balanced diet was sent to patient via MyChart

## 2023-11-12 NOTE — Progress Notes (Signed)
 Complete physical exam   Patient: Barbara Holmes   DOB: 1961-03-21   63 y.o. Female  MRN: [id number] Visit Date: 11/12/2023  Today's healthcare provider: Mimi Alt, MD   Chief Complaint  Patient presents with   Annual Exam    No concerns    Care Management    Pattern of eating  General   Are you exercising: yes  What exercising: walking   How long: 2 miles   How frequent: 3-4 times a week      Subjective    Barbara Holmes is a 63 y.o. female who presents today for a complete physical exam.    to discuss today.   Discussed the use of AI scribe software for clinical note transcription with the patient, who gave verbal consent to proceed.  History of Present Illness Barbara Holmes "Barbara Holmes" is a 63 year old female with chronic kidney disease, hypertension, and type 2 diabetes who presents for an annual physical exam.  She has chronic kidney disease, stage 3B, and anemia but is currently asymptomatic with no specific concerns related to her kidney disease. A urine sample has been provided for testing.  She has a history of essential hypertension with a recent home blood pressure reading of 160/93. She is on medication to manage her blood pressure.  She has type 2 diabetes and hyperlipidemia associated with her diabetes. She is on medication to manage her blood glucose and lipid levels and has no specific concerns related to her diabetes at this time.  She experiences occasional dizziness and requests a refill of meclizine. She also experiences nausea and requests a refill of Zofran  4 mg every eight hours as needed.  She maintains a general diet and exercises by walking two miles three to four times a week. She notes she used to walk more when she worked at [employer].     Past Medical History:  Diagnosis Date   Chronic kidney disease 09/04/2022   Diabetes mellitus without complication (HCC)    Hyperlipidemia    Hypertension    Restless leg syndrome     Past Surgical History:  Procedure Laterality Date   ABDOMINAL HYSTERECTOMY     total   BREAST EXCISIONAL BIOPSY Right 1999   benign   COLONOSCOPY N/A 11/26/2020   Procedure: COLONOSCOPY;  Surgeon: Luke Salaam, MD;  Location: [facility] ENDOSCOPY;  Service: Gastroenterology;  Laterality: N/A;   FOOT SURGERY     LAPAROSCOPY     endometriosis   Social History   Socioeconomic History   Marital status: Divorced    Spouse name: Not on file   Number of children: Not on file   Years of education: Not on file   Highest education level: GED or equivalent  Occupational History   Not on file  Tobacco Use   Smoking status: Former    Current packs/day: 0.00    Types: Cigarettes    Start date: 06/05/1985    Quit date: 06/15/1999    Years since quitting: 24.4   Smokeless tobacco: Never   Tobacco comments:    1 cigarette per week  Vaping Use   Vaping status: Never Used  Substance and Sexual Activity   Alcohol use: No   Drug use: No   Sexual activity: Yes    Birth control/protection: None  Other Topics Concern   Not on file  Social History Narrative   Not on file   Social Drivers of Health   Financial Resource Strain: Low  Risk  (06/19/2023)   Overall Financial Resource Strain (CARDIA)    Difficulty of Paying Living Expenses: Not hard at all  Food Insecurity: No Food Insecurity (06/19/2023)   Hunger Vital Sign    Worried About Running Out of Food in the Last Year: Never true    Ran Out of Food in the Last Year: Never true  Transportation Needs: No Transportation Needs (06/19/2023)   PRAPARE - Administrator, Civil Service (Medical): No    Lack of Transportation (Non-Medical): No  Physical Activity: Sufficiently Active (06/19/2023)   Exercise Vital Sign    Days of Exercise per Week: 5 days    Minutes of Exercise per Session: 150+ min  Stress: No Stress Concern Present (06/19/2023)   [employer] of Occupational Health - Occupational Stress Questionnaire    Feeling of  Stress : Not at all  Social Connections: Socially Isolated (06/19/2023)   Social Connection and Isolation Panel [NHANES]    Frequency of Communication with Friends and Family: Once a week    Frequency of Social Gatherings with Friends and Family: Once a week    Attends Religious Services: 1 to 4 times per year    Active Member of [employer] or Organizations: No    Attends Engineer, structural: Not on file    Marital Status: Divorced  Intimate Partner Violence: Not At Risk (11/12/2023)   Humiliation, Afraid, Rape, and Kick questionnaire    Fear of Current or Ex-Partner: No    Emotionally Abused: No    Physically Abused: No    Sexually Abused: No   Family Status  Relation Name Status   Mother Elva Hamburger Deceased   Father Hilton Lucky Deceased   Sister Merrilee Abernethy Alive   Brother  Deceased   Mat Aunt  (Not Specified)   MGM  Deceased  No partnership data on file   Family History  Problem Relation Age of Onset   Hypertension Mother    Hyperlipidemia Mother    Diabetes Mother    Dementia Mother    Hypertension Father    Hyperlipidemia Father    Asthma Father    Heart attack Father    Hypertension Sister    Hyperlipidemia Sister    Diabetes Sister    Breast cancer Maternal Aunt 58   Diabetes Maternal Grandmother    No Known Allergies   Medications: Outpatient Medications Prior to Visit  Medication Sig   albuterol (VENTOLIN HFA) 108 (90 Base) MCG/ACT inhaler Ventolin HFA 90 mcg/actuation aerosol inhaler  INHALE 2 PUFFS BY MOUTH EVERY 4 HOURS AS NEEDED FOR COUGH OR WHEEZING   Cholecalciferol 1000 units capsule Take 1,000 Units by mouth daily.    FARXIGA  10 MG TABS tablet Take 10 mg by mouth daily.   lansoprazole  (PREVACID ) 30 MG capsule Take 1 capsule (30 mg total) by mouth daily as needed.   lansoprazole  (PREVACID ) 30 MG capsule Take 1 capsule (30 mg total) by mouth daily at 12 noon.   Nutritional Supplements (FEEDING SUPPLEMENT, NEPRO CARB STEADY,) LIQD Take  237 mLs by mouth 2 (two) times daily between meals.   rosuvastatin  (CRESTOR ) 40 MG tablet TAKE 1 TABLET BY MOUTH EVERY DAY   tirzepatide  (MOUNJARO ) 5 MG/0.5ML Pen Inject 5 mg into the skin once a week.   traMADol  (ULTRAM ) 50 MG tablet TAKE 1 TO 2 TABLETS BY MOUTH AT BEDTIME AS NEEDED   [DISCONTINUED] meclizine (ANTIVERT) 25 MG tablet meclizine 25 mg tablet  TAKE 1 TABLET BY MOUTH TWICE  A DAY AS NEEDED   [DISCONTINUED] ondansetron  (ZOFRAN ) 4 MG tablet Take 4 mg by mouth every 8 (eight) hours as needed for vomiting or nausea.   No facility-administered medications prior to visit.    Review of Systems  Last lipids Lab Results  Component Value Date   CHOL 123 11/07/2022   HDL 46 11/07/2022   LDLCALC 57 11/07/2022   TRIG 111 11/07/2022   CHOLHDL 2.7 11/07/2022   Last hemoglobin A1c Lab Results  Component Value Date   HGBA1C 6.7 (H) 03/22/2023       Objective    BP 130/65   Pulse (!) 55   Ht 4\' 11"  (1.499 m)   Wt 136 lb (61.7 kg)   SpO2 97%   BMI 27.47 kg/m  BP Readings from Last 3 Encounters:  11/12/23 130/65  10/19/23 120/70  07/24/23 132/82   Wt Readings from Last 3 Encounters:  11/12/23 136 lb (61.7 kg)  10/19/23 136 lb 9.6 oz (62 kg)  07/24/23 134 lb 9.6 oz (61.1 kg)        Physical Exam Vitals reviewed.  Constitutional:      General: She is not in acute distress.    Appearance: Normal appearance. She is not ill-appearing, toxic-appearing or diaphoretic.  HENT:     Head: Normocephalic and atraumatic.     Right Ear: Tympanic membrane and external ear normal. There is no impacted cerumen.     Left Ear: Tympanic membrane and external ear normal. There is no impacted cerumen.     Nose: Nose normal.     Mouth/Throat:     Pharynx: Oropharynx is clear.  Eyes:     General: No scleral icterus.    Extraocular Movements: Extraocular movements intact.     Conjunctiva/sclera: Conjunctivae normal.     Pupils: Pupils are equal, round, and reactive to light.   Cardiovascular:     Rate and Rhythm: Normal rate and regular rhythm.     Pulses: Normal pulses.     Heart sounds: Normal heart sounds. No murmur heard.    No friction rub. No gallop.  Pulmonary:     Effort: Pulmonary effort is normal. No respiratory distress.     Breath sounds: Normal breath sounds. No wheezing, rhonchi or rales.  Abdominal:     General: Bowel sounds are normal. There is no distension.     Palpations: Abdomen is soft. There is no mass.     Tenderness: There is no abdominal tenderness. There is no guarding.  Musculoskeletal:        General: No deformity.     Cervical back: Normal range of motion and neck supple.     Right lower leg: No edema.     Left lower leg: No edema.  Lymphadenopathy:     Cervical: No cervical adenopathy.  Skin:    General: Skin is warm.     Capillary Refill: Capillary refill takes less than 2 seconds.     Findings: No erythema or rash.  Neurological:     General: No focal deficit present.     Mental Status: She is alert and oriented to person, place, and time.     Cranial Nerves: Cranial nerves 2-12 are intact. No cranial nerve deficit or facial asymmetry.     Motor: Motor function is intact. No weakness.     Gait: Gait normal.  Psychiatric:        Mood and Affect: Mood normal.        Behavior: Behavior normal.  Last depression screening scores    11/12/2023    8:10 AM 06/21/2023    3:58 PM 03/21/2023    3:50 PM  PHQ 2/9 Scores  PHQ - 2 Score 0 0 0  PHQ- 9 Score 0 0     Last fall risk screening    03/21/2023    3:50 PM  Fall Risk   Falls in the past year? 0  Number falls in past yr: 0  Injury with Fall? 0  Follow up Falls evaluation completed    Last Audit-C alcohol use screening    06/19/2023    4:55 PM  Alcohol Use Disorder Test (AUDIT)  1. How often do you have a drink containing alcohol? 0  3. How often do you have six or more drinks on one occasion? 0   A score of 3 or more in women, and 4 or more in men  indicates increased risk for alcohol abuse, EXCEPT if all of the points are from question 1   No results found for any visits on 11/12/23.  Assessment & Plan    Routine Health Maintenance and Physical Exam  Immunization History  Administered Date(s) Administered   Influenza Whole 03/14/2017   Influenza, Quadrivalent, Recombinant, Inj, Pf 03/08/2019   Influenza, Seasonal, Injecte, Preservative Fre 03/21/2023   Influenza,inj,Quad PF,6+ Mos 04/03/2018, 04/26/2021, 05/03/2022   Influenza-Unspecified 03/11/2015, 01/31/2020   Moderna Covid-19 Vaccine Bivalent Booster 71yrs & up 08/02/2020   Moderna Sars-Covid-2 Vaccination 12/30/2019, 01/27/2020   PNEUMOCOCCAL CONJUGATE-20 11/12/2023   Pneumococcal Polysaccharide-23 04/03/2016   Td 04/13/2003   Tdap 08/01/2012, 11/07/2022   Zoster Recombinant(Shingrix ) 04/26/2021, 10/26/2021    Health Maintenance  Topic Date Due   COVID-19 Vaccine (4 - 2024-25 season) 02/04/2023   Diabetic kidney evaluation - Urine ACR  06/01/2023   HEMOGLOBIN A1C  09/20/2023   INFLUENZA VACCINE  01/04/2024   MAMMOGRAM  02/22/2024   OPHTHALMOLOGY EXAM  05/22/2024   Diabetic kidney evaluation - eGFR measurement  06/20/2024   FOOT EXAM  11/11/2024   Colonoscopy  11/27/2030   DTaP/Tdap/Td (4 - Td or Tdap) 11/06/2032   Pneumococcal Vaccine 43-79 Years old  Completed   Hepatitis C Screening  Completed   HIV Screening  Completed   Zoster Vaccines- Shingrix   Completed   HPV VACCINES  Aged Out   Meningococcal B Vaccine  Aged Out    Problem List Items Addressed This Visit       Cardiovascular and Mediastinum   Essential (primary) hypertension     Endocrine   Hyperlipidemia associated with type 2 diabetes mellitus (HCC)   Relevant Orders   Lipid panel   Diabetes mellitus, type 2 (HCC)   Relevant Orders   Urine Albumin/Creatinine with ratio (send out) [LAB689]   Hemoglobin A1c   Pneumococcal conjugate vaccine 20-valent (Prevnar 20) (Completed)      Genitourinary   Chronic kidney disease, stage 3b (HCC)   Anemia in chronic kidney disease (Chronic)     Other   Postural lightheadedness   Relevant Medications   meclizine (ANTIVERT) 25 MG tablet   Avitaminosis D   Annual physical exam - Primary   DM foot exam completed today, DM eye exam was lat in 05/2023  Urine albumin collected  Lipid panel ordered  UTD on vaccines including Shingrix , COVID and pneumococcal  Last colon CA screening completed, 2022 N/A pap candidate, s/p total hysterectomy  Mammogram ordered for breast cancer screening  H/O recommending of exercise per week and well balanced diet was  sent to patient via MyChart       Other Visit Diagnoses       Nausea       Relevant Medications   ondansetron  (ZOFRAN ) 4 MG tablet     Need for pneumococcal vaccine       Relevant Orders   Pneumococcal conjugate vaccine 20-valent (Prevnar 20) (Completed)     Encounter for screening mammogram for malignant neoplasm of breast       Relevant Orders   MM 3D SCREENING MAMMOGRAM BILATERAL BREAST        Assessment & Plan   Hypertension Essential hypertension requiring regular monitoring. She reported a home reading of 160/93 mmHg. - Advise home blood pressure monitoring  Type 2 Diabetes Mellitus Type 2 diabetes mellitus, requires regular monitoring of blood glucose control. - Order hemoglobin A1c test - Perform diabetes foot exam  Chronic Kidney Disease, Stage 3B Chronic kidney disease, stage 3B, requires monitoring to assess kidney function and progression. - Order urine albumin test  Anemia Chronic anemia likely related to chronic kidney disease, requires monitoring.  Hyperlipidemia Hyperlipidemia associated with type 2 diabetes, requires monitoring and management to prevent cardiovascular complications. - Order lipid panel  Vitamin D Deficiency Vitamin D deficiency, requires management to prevent complications related to bone  health.  Dizziness Experiences dizziness and requests a refill of meclizine for symptomatic relief. - Refill meclizine prescription  Nausea Experiences nausea and requests a refill of Zofran  for symptomatic relief. - Refill Zofran  prescription  General Health Maintenance General health maintenance including vaccinations and lifestyle modifications discussed. - Administer pneumococcal vaccine - Encourage regular exercise, walking two miles three to four times a week  Follow-up Routine follow-up to monitor chronic conditions and overall health. - Schedule follow-up appointment in December - Advise to contact if any issues arise before the next scheduled visit       No follow-ups on file.       Mimi Alt, MD  [facility] [phone] (phone) [phone] (fax)  [facility]

## 2023-11-13 LAB — MICROALBUMIN / CREATININE URINE RATIO
Creatinine, Urine: 150.7 mg/dL
Microalb/Creat Ratio: 119 mg/g{creat} — ABNORMAL HIGH (ref 0–29)
Microalbumin, Urine: 178.6 ug/mL

## 2023-11-13 LAB — LIPID PANEL
Chol/HDL Ratio: 1.9 ratio (ref 0.0–4.4)
Cholesterol, Total: 123 mg/dL (ref 100–199)
HDL: 64 mg/dL (ref >39)
LDL Chol Calc (NIH): 43 mg/dL (ref 0–99)
Triglycerides: 84 mg/dL (ref 0–149)
VLDL Cholesterol Cal: 16 mg/dL (ref 5–40)

## 2023-11-13 LAB — HEMOGLOBIN A1C
Est. average glucose Bld gHb Est-mCnc: 114 mg/dL
Hgb A1c MFr Bld: 5.6 % (ref 4.8–5.6)

## 2023-11-14 ENCOUNTER — Ambulatory Visit: Payer: Self-pay | Admitting: Family Medicine

## 2023-12-07 ENCOUNTER — Other Ambulatory Visit: Payer: Self-pay | Admitting: Family Medicine

## 2023-12-07 DIAGNOSIS — E119 Type 2 diabetes mellitus without complications: Secondary | ICD-10-CM

## 2023-12-07 DIAGNOSIS — G2581 Restless legs syndrome: Secondary | ICD-10-CM

## 2023-12-11 NOTE — Telephone Encounter (Signed)
 LOV 11/12/23 NOV 05/13/24 LRF 11/07/23 LABS 11/12/23

## 2024-01-06 ENCOUNTER — Other Ambulatory Visit: Payer: Self-pay | Admitting: Family Medicine

## 2024-01-06 DIAGNOSIS — E782 Mixed hyperlipidemia: Secondary | ICD-10-CM

## 2024-03-06 DIAGNOSIS — E1129 Type 2 diabetes mellitus with other diabetic kidney complication: Secondary | ICD-10-CM | POA: Diagnosis not present

## 2024-03-06 DIAGNOSIS — R809 Proteinuria, unspecified: Secondary | ICD-10-CM | POA: Diagnosis not present

## 2024-03-06 DIAGNOSIS — I129 Hypertensive chronic kidney disease with stage 1 through stage 4 chronic kidney disease, or unspecified chronic kidney disease: Secondary | ICD-10-CM | POA: Diagnosis not present

## 2024-03-27 ENCOUNTER — Other Ambulatory Visit (HOSPITAL_COMMUNITY): Payer: Self-pay

## 2024-04-07 ENCOUNTER — Telehealth: Payer: Self-pay

## 2024-04-07 DIAGNOSIS — E119 Type 2 diabetes mellitus without complications: Secondary | ICD-10-CM

## 2024-04-07 NOTE — Telephone Encounter (Signed)
 Copied from CRM [id number]. Topic: Clinical - Prescription Issue >> Apr 04, 2024 12:31 PM Charlet HERO wrote: Reason for CRM: patient is calling bc she states that insurance has tried to contact provider for  auth and they were not able to reach her so it has been denied for MOUNJARO  5 MG/0.5ML Pen, she is stating the insurance states that she can call to get approved.

## 2024-04-08 ENCOUNTER — Other Ambulatory Visit (HOSPITAL_COMMUNITY): Payer: Self-pay

## 2024-04-08 MED ORDER — MOUNJARO 5 MG/0.5ML ~~LOC~~ SOAJ: 5.0000 mg | SUBCUTANEOUS | 0 refills | Status: DC

## 2024-04-08 NOTE — Telephone Encounter (Signed)
 Left detailed voicemail to advise patient of pharmacy team's message. Also let patient know that refills were provided just in case but earliest fill date is 11/13 according to insurance.   E2C2- Please advise patient of this if/ when she returns call.

## 2024-04-08 NOTE — Addendum Note (Signed)
 Addended by: CHERRY CHIQUITA HERO on: 04/08/2024 10:07 AM   Modules accepted: Orders

## 2024-04-09 ENCOUNTER — Ambulatory Visit
Admission: RE | Admit: 2024-04-09 | Discharge: 2024-04-09 | Disposition: A | Discharge status: Home / Self Care | Source: Ambulatory Visit | Attending: Family Medicine | Admitting: Family Medicine

## 2024-04-09 ENCOUNTER — Other Ambulatory Visit (HOSPITAL_COMMUNITY): Payer: Self-pay

## 2024-04-09 DIAGNOSIS — Z1231 Encounter for screening mammogram for malignant neoplasm of breast: Secondary | ICD-10-CM | POA: Diagnosis not present

## 2024-04-09 NOTE — Telephone Encounter (Unsigned)
 Copied from CRM [id number]. Topic: Clinical - Medication Prior Auth >> Apr 09, 2024 11:38 AM Amy B wrote: Reason for CRM: Mounjaro  requires prior authorization.  Please initiate

## 2024-04-09 NOTE — Telephone Encounter (Signed)
 LVM asking patient to return call. Patient has refills of medication and prior auth is not needed at this time according to prior auth team. I think patient may be needing authorization from provider to fill this medication early.   E2C2- If patient returns call PLEASE inquire if she is needing provider auth to fill this early. No PA is needed from insurance at this time and she has refills.

## 2024-04-10 ENCOUNTER — Telehealth: Payer: Self-pay

## 2024-04-10 ENCOUNTER — Other Ambulatory Visit (HOSPITAL_COMMUNITY): Payer: Self-pay

## 2024-04-10 NOTE — Telephone Encounter (Signed)
 Copied from CRM [id number]. Topic: General - Other >> Apr 09, 2024  5:01 PM Wess RAMAN wrote: Reason for CRM: Patient states her insurance company is denying the Mounjaro . She stated Dr. Sharma has to send in supporting evidence to show patient is a diabetic and confirm she is Kathaleen PONCE Rushing #: [id number]  Pharmacy: [facility] [address] ([address]), [place] - [address] ([address]) [place] [address] Phone: [phone] Fax: [phone] Hours: Not open 24 hours

## 2024-04-14 ENCOUNTER — Other Ambulatory Visit (HOSPITAL_COMMUNITY): Payer: Self-pay

## 2024-04-14 ENCOUNTER — Telehealth: Payer: Self-pay | Admitting: Pharmacy Technician

## 2024-04-14 NOTE — Telephone Encounter (Signed)
 PA request has been Received. New Encounter has been or will be created for follow up. For additional info see Pharmacy Prior Auth telephone encounter from 04/14/24.

## 2024-04-14 NOTE — Telephone Encounter (Signed)
 Additional information has been requested from the patient's insurance in order to proceed with the prior authorization request. Requested information has been sent, or form has been filled out and faxed back to [employer] at [phone]

## 2024-04-14 NOTE — Telephone Encounter (Signed)
 Pharmacy Patient Advocate Encounter   Received notification from Pt Calls Messages that prior authorization for Mounjaro  5mg  is required/requested.   Insurance verification completed.   The patient is insured through [facility] ADVANTAGE/RX ADVANCE.   Per test claim: PA required and submitted KEY/EOC/Request #: [id number] due to previous denial on file.  Called to find out about denial- it was initiated by the patient and denied on 04/03/24 due to not receiving any documentation. Rep will be faxing over the form to be completed and sent back with office notes and labs confirming type 2 diabetes.  HTA phone # [phone]

## 2024-04-24 ENCOUNTER — Other Ambulatory Visit (HOSPITAL_COMMUNITY): Payer: Self-pay

## 2024-04-24 NOTE — Telephone Encounter (Signed)
 Called to advise patient this was approved.

## 2024-04-24 NOTE — Telephone Encounter (Signed)
 Pharmacy Patient Advocate Encounter  Received notification from [facility] ADVANTAGE/RX ADVANCE that Prior Authorization for Mounjaro  5mg  has been APPROVED from now to 04/15/25. Ran test claim, Copay is $0. This test claim was processed through [facility]- copay amounts may vary at other pharmacies due to pharmacy/plan contracts, or as the patient moves through the different stages of their insurance plan.   PA #/Case ID/Reference #: [id number]

## 2024-04-25 ENCOUNTER — Telehealth: Payer: Self-pay

## 2024-04-25 NOTE — Progress Notes (Signed)
 Pharmacy Quality Measure Review  This patient is appearing on a report for being at risk of failing the adherence measure for diabetes medications this calendar year.   Medication: Farxiga  Last fill date: 04/20/24 for 30 day supply Medication is prescribed by nephrology.  Insurance report was not up to date. No action needed at this time.    Giabella Duhart E. Marsh, PharmD, CPP Clinical Pharmacist [facility] [phone]

## 2024-05-11 ENCOUNTER — Other Ambulatory Visit: Payer: Self-pay | Admitting: Family Medicine

## 2024-05-11 DIAGNOSIS — G2581 Restless legs syndrome: Secondary | ICD-10-CM

## 2024-05-13 ENCOUNTER — Ambulatory Visit: Admitting: Family Medicine

## 2024-05-16 ENCOUNTER — Ambulatory Visit (INDEPENDENT_AMBULATORY_CARE_PROVIDER_SITE_OTHER): Admitting: Family Medicine

## 2024-05-16 VITALS — BP 115/84 | HR 63 | Ht 59.0 in | Wt 141.7 lb

## 2024-05-16 DIAGNOSIS — R051 Acute cough: Secondary | ICD-10-CM | POA: Diagnosis not present

## 2024-05-16 DIAGNOSIS — K2101 Gastro-esophageal reflux disease with esophagitis, with bleeding: Secondary | ICD-10-CM

## 2024-05-16 DIAGNOSIS — J3089 Other allergic rhinitis: Secondary | ICD-10-CM

## 2024-05-16 DIAGNOSIS — J0110 Acute frontal sinusitis, unspecified: Secondary | ICD-10-CM | POA: Diagnosis not present

## 2024-05-16 DIAGNOSIS — E1122 Type 2 diabetes mellitus with diabetic chronic kidney disease: Secondary | ICD-10-CM

## 2024-05-16 DIAGNOSIS — D631 Anemia in chronic kidney disease: Secondary | ICD-10-CM

## 2024-05-16 DIAGNOSIS — G959 Disease of spinal cord, unspecified: Secondary | ICD-10-CM

## 2024-05-16 DIAGNOSIS — E1169 Type 2 diabetes mellitus with other specified complication: Secondary | ICD-10-CM | POA: Diagnosis not present

## 2024-05-16 DIAGNOSIS — I152 Hypertension secondary to endocrine disorders: Secondary | ICD-10-CM | POA: Diagnosis not present

## 2024-05-16 DIAGNOSIS — M1990 Unspecified osteoarthritis, unspecified site: Secondary | ICD-10-CM

## 2024-05-16 DIAGNOSIS — E559 Vitamin D deficiency, unspecified: Secondary | ICD-10-CM

## 2024-05-16 DIAGNOSIS — G2581 Restless legs syndrome: Secondary | ICD-10-CM

## 2024-05-16 DIAGNOSIS — J029 Acute pharyngitis, unspecified: Secondary | ICD-10-CM

## 2024-05-16 DIAGNOSIS — N1832 Chronic kidney disease, stage 3b: Secondary | ICD-10-CM

## 2024-05-16 DIAGNOSIS — E1159 Type 2 diabetes mellitus with other circulatory complications: Secondary | ICD-10-CM

## 2024-05-16 DIAGNOSIS — E119 Type 2 diabetes mellitus without complications: Secondary | ICD-10-CM

## 2024-05-16 DIAGNOSIS — R110 Nausea: Secondary | ICD-10-CM

## 2024-05-16 LAB — POCT GLYCOSYLATED HEMOGLOBIN (HGB A1C): Hemoglobin A1C: 5.6 % (ref 4.0–5.6)

## 2024-05-16 MED ORDER — BLOOD GLUCOSE MONITORING SUPPL DEVI: 1.0000 | 0 refills | Status: AC

## 2024-05-16 MED ORDER — ONDANSETRON HCL 4 MG PO TABS: 4.0000 mg | ORAL_TABLET | Freq: Three times a day (TID) | ORAL | 2 refills | Status: AC | PRN

## 2024-05-16 MED ORDER — MOUNJARO 5 MG/0.5ML ~~LOC~~ SOAJ: 5.0000 mg | SUBCUTANEOUS | 2 refills | Status: AC

## 2024-05-16 MED ORDER — AMOXICILLIN 875 MG PO TABS: 875.0000 mg | ORAL_TABLET | Freq: Two times a day (BID) | ORAL | 0 refills | Status: AC

## 2024-05-16 MED ORDER — LANCETS MISC: 1.0000 | 0 refills | Status: AC

## 2024-05-16 MED ORDER — LANCET DEVICE MISC: 1.0000 | 0 refills | Status: AC

## 2024-05-16 MED ORDER — BLOOD GLUCOSE TEST VI STRP: 1.0000 | ORAL_STRIP | 0 refills | Status: AC

## 2024-05-16 MED ORDER — BENZONATATE 200 MG PO CAPS: 200.0000 mg | ORAL_CAPSULE | Freq: Two times a day (BID) | ORAL | 0 refills | Status: AC | PRN

## 2024-05-16 NOTE — Progress Notes (Signed)
 Established patient visit   Patient: Barbara Holmes   DOB: 08-18-1960   63 y.o. Female  MRN: [id number] Visit Date: 05/16/2024  Today's healthcare provider: Rockie Agent, MD   Chief Complaint  Patient presents with   Medical Management of Chronic Issues   Diabetes   Hyperlipidemia   Subjective       Discussed the use of AI scribe software for clinical note transcription with the patient, who gave verbal consent to proceed.  History of Present Illness Barbara Holmes is a 63 year old female with type two diabetes and stage three B chronic kidney disease who presents for a chronic follow-up and evaluation of acute upper respiratory symptoms.  She has been experiencing a sore throat, right ear pain, nausea, dizziness, and a cough that has progressed to chest congestion since Monday, five days ago. No fevers or chills, but she mentions feeling cold. She also reports headaches but no body aches.  For her type two diabetes, she continues to take Farxiga  10 mg daily and Mounjaro  5 mg weekly. Her last A1c was well controlled at 5.6. She uses a [employer] glucose monitoring system at home but is interested in obtaining a prescription for the OneTouch glucose monitoring system and Freestyle Libre 3 Plus.  Regarding her stage three B chronic kidney disease, she was last evaluated by nephrology in October. She is currently holding lisinopril  due to a history of acute kidney injury. Her last creatinine level was 1.06 in September. She continues to take cholecalciferol 1000 units daily for vitamin D deficiency and her PTH was 36 at the last nephrology visit.  Her medication regimen includes Crestor  40 mg daily for hyperlipidemia, Provisc 30 mg daily for GERD, with additional Prevacid  30 mg as needed, and tramadol  50-100 mg at bedtime as needed for cervical myelopathy and arthritis. She also has prescriptions for Antivert  25 mg three times daily as needed for dizziness and  Zofran  4 mg every eight hours as needed for nausea.     Past Medical History:  Diagnosis Date   Arthritis    Chronic kidney disease 09/04/2022   Diabetes mellitus without complication (HCC)    GERD (gastroesophageal reflux disease)    Hyperlipidemia    Hypertension    Restless leg syndrome     Medications: Show/hide medication list[1]  Review of Systems  Last CBC Lab Results  Component Value Date   WBC 7.0 05/17/2023   HGB 11.8 05/17/2023   HCT 36.9 05/17/2023   MCV 92 05/17/2023   MCH 29.5 05/17/2023   RDW 12.7 05/17/2023   PLT 209 05/17/2023   Last metabolic panel Lab Results  Component Value Date   GLUCOSE 117 (H) 06/21/2023   NA 142 06/21/2023   K 3.9 06/21/2023   CL 101 06/21/2023   CO2 27 06/21/2023   BUN 17 06/21/2023   CREATININE 1.30 (H) 06/21/2023   EGFR 46 (L) 06/21/2023   CALCIUM  10.1 06/21/2023   PHOS 3.3 06/21/2023   PROT 7.3 05/17/2023   ALBUMIN 4.5 06/21/2023   LABGLOB 2.8 05/17/2023   AGRATIO 1.0 09/04/2022   BILITOT 0.3 05/17/2023   ALKPHOS 64 05/17/2023   AST 41 (H) 05/17/2023   ALT 32 05/17/2023   ANIONGAP 11 09/08/2022   Last lipids Lab Results  Component Value Date   CHOL 123 11/12/2023   HDL 64 11/12/2023   LDLCALC 43 11/12/2023   TRIG 84 11/12/2023   CHOLHDL 1.9 11/12/2023   Last hemoglobin A1c  Lab Results  Component Value Date   HGBA1C 5.6 05/16/2024   Last thyroid functions Lab Results  Component Value Date   TSH 0.401 (L) 05/17/2023   FREET4 1.05 05/17/2023        Objective    BP 115/84 (BP Location: Right Arm, Patient Position: Sitting, Cuff Size: Normal)   Pulse 63   Ht 4' 11 (1.499 m)   Wt 141 lb 11.2 oz (64.3 kg)   SpO2 98%   BMI 28.62 kg/m  BP Readings from Last 3 Encounters:  05/16/24 115/84  11/12/23 130/65  10/19/23 120/70   Wt Readings from Last 3 Encounters:  05/16/24 141 lb 11.2 oz (64.3 kg)  11/12/23 136 lb (61.7 kg)  10/19/23 136 lb 9.6 oz (62 kg)        Physical Exam  Physical  Exam MEASUREMENTS: Height- 5'6. GENERAL: Ill appearing but non-toxic, not in respiratory distress. HEENT: Erythematous oropharynx, no exudate. Frontal sinus tenderness, no maxillary sinus tenderness. Bilateral tympanic membrane effusions. CHEST: Lungs clear to auscultation. CARDIOVASCULAR: Regular rate and rhythm.    Results for orders placed or performed in visit on 05/16/24  POCT glycosylated hemoglobin (Hb A1C)  Result Value Ref Range   Hemoglobin A1C 5.6 4.0 - 5.6 %   HbA1c POC (<> result, manual entry)     HbA1c, POC (prediabetic range)     HbA1c, POC (controlled diabetic range)      Assessment & Plan     Problem List Items Addressed This Visit     Allergic rhinitis   Anemia in chronic kidney disease (Chronic)   Arthritis   Avitaminosis D   Cervical myelopathy (HCC)   Diabetes mellitus, type 2 (HCC)   Relevant Medications   tirzepatide  (MOUNJARO ) 5 MG/0.5ML Pen   Other Relevant Orders   POCT glycosylated hemoglobin (Hb A1C) (Completed)   Esophagitis, reflux   Essential (primary) hypertension   Hyperlipidemia associated with type 2 diabetes mellitus (HCC)   Relevant Medications   tirzepatide  (MOUNJARO ) 5 MG/0.5ML Pen   Restless leg   Other Visit Diagnoses       Acute non-recurrent frontal sinusitis    -  Primary   Relevant Medications   amoxicillin  (AMOXIL ) 875 MG tablet   benzonatate  (TESSALON ) 200 MG capsule     Sore throat         Nausea       Relevant Medications   ondansetron  (ZOFRAN ) 4 MG tablet     Acute cough       Relevant Medications   benzonatate  (TESSALON ) 200 MG capsule       Assessment and Plan Assessment & Plan Acute frontal sinusitis Erythematous oral pharynx, frontal sinus tenderness, rhinorrhea, and bilateral tympanic membrane effusions. Symptoms include sore throat, ear pain, nausea, dizziness, and cough with chest drainage. No fever or chills reported. Symptoms have been present since Monday. - Prescribed amoxicillin  875 mg twice  daily for 7 days - prescribed tessalon  perles 200mg  q12 hours PRN - Ordered strep, COVID, and influenza AUV tests  Type 2 diabetes mellitus with stage 3b chronic kidney disease Chronic  Type 2 diabetes is well controlled with an A1c of 5.6. Stage 3b chronic kidney disease is managed by nephrology. Last creatinine was 1.06 in September, indicating improvement. Lisinopril  is held due to history of acute kidney injury. Farxiga  is continued. Mounjaro  is approved until November 2026. - Continue Farxiga  10 mg daily - Continue Mounjaro  5 mg weekly with two refills - Ordered OneTouch glucose monitoring system and Freestyle  Libre 3 Plus  Hypertension associated with diabetes Chronic  Hypertension is managed in the context of diabetes. Lisinopril  is held due to history of acute kidney injury. - Continue current management as per nephrology recommendations  Hyperlipidemia associated with type 2 diabetes mellitus Chronic  Hyperlipidemia is managed with Crestor . LDL is within goal range at 43. - Continue Crestor  40 mg daily  Gastroesophageal reflux disease with esophagitis and hemorrhage Chronic GERD is managed with Provisc and Prevacid  as needed. - Continue Provisc 30 mg daily - Continue Prevacid  30 mg as needed  Vitamin D deficiency Chronic  Managed with cholecalciferol. - Continue cholecalciferol 1000 units daily  Restless legs syndrome Chronic  Managed with tramadol  as needed. - Continue tramadol  50-100 mg at bedtime as needed  Cervical myelopathy Chronic  Managed with tramadol  as needed. - Continue tramadol  50-100 mg at bedtime as needed  Osteoarthritis Chronic  Managed with tramadol  as needed. - Continue tramadol  50-100 mg at bedtime as needed   Return in about 6 months (around 11/14/2024) for AWV, CPE.      Rockie Agent, MD  [facility] [phone] (phone) [phone] (fax)  [facility]      [1]  Outpatient  Medications Prior to Visit  Medication Sig   albuterol (VENTOLIN HFA) 108 (90 Base) MCG/ACT inhaler Ventolin HFA 90 mcg/actuation aerosol inhaler  INHALE 2 PUFFS BY MOUTH EVERY 4 HOURS AS NEEDED FOR COUGH OR WHEEZING   Cholecalciferol 1000 units capsule Take 1,000 Units by mouth daily.    FARXIGA  10 MG TABS tablet Take 10 mg by mouth daily.   lansoprazole  (PREVACID ) 30 MG capsule Take 1 capsule (30 mg total) by mouth daily as needed.   lansoprazole  (PREVACID ) 30 MG capsule Take 1 capsule (30 mg total) by mouth daily at 12 noon.   meclizine  (ANTIVERT ) 25 MG tablet Take 1 tablet (25 mg total) by mouth 3 (three) times daily as needed for dizziness.   Nutritional Supplements (FEEDING SUPPLEMENT, NEPRO CARB STEADY,) LIQD Take 237 mLs by mouth 2 (two) times daily between meals.   rosuvastatin  (CRESTOR ) 40 MG tablet Take 1 tablet by mouth once daily   traMADol  (ULTRAM ) 50 MG tablet TAKE 1 TO 2 TABLETS BY MOUTH AT BEDTIME AS NEEDED   [DISCONTINUED] ondansetron  (ZOFRAN ) 4 MG tablet Take 1 tablet (4 mg total) by mouth every 8 (eight) hours as needed for vomiting or nausea.   [DISCONTINUED] tirzepatide  (MOUNJARO ) 5 MG/0.5ML Pen Inject 5 mg into the skin once a week.   No facility-administered medications prior to visit.

## 2024-05-16 NOTE — Patient Instructions (Signed)
 To keep you healthy, please keep in mind the following health maintenance items that you are due for:   Health Maintenance Due  Topic Date Due   Medicare Annual Wellness (AWV)  Never done   Influenza Vaccine  01/04/2024   COVID-19 Vaccine (4 - 2025-26 season) 02/04/2024   Bone Density Scan  Never done     Best Wishes,   Dr. Lang

## 2024-05-20 ENCOUNTER — Encounter: Payer: Self-pay | Admitting: Family Medicine

## 2024-05-21 ENCOUNTER — Telehealth: Payer: Self-pay

## 2024-05-21 ENCOUNTER — Other Ambulatory Visit (HOSPITAL_COMMUNITY): Payer: Self-pay

## 2024-05-21 ENCOUNTER — Other Ambulatory Visit: Payer: Self-pay | Admitting: Family Medicine

## 2024-05-21 DIAGNOSIS — E119 Type 2 diabetes mellitus without complications: Secondary | ICD-10-CM

## 2024-05-21 MED ORDER — FREESTYLE LIBRE 3 PLUS SENSOR MISC: 3 refills | Status: AC

## 2024-05-21 NOTE — Telephone Encounter (Signed)
 Please note this P/A was submitted verbally at [id number] as it couldn't be processed via latent   Pharmacy Patient Advocate Encounter   Received notification from Onbase that prior authorization for ondansetron  (ZOFRAN ) 4 MG tablet  is required/requested.   Insurance verification completed.   The patient is insured through [facility] [employer]/RX ADV[employer].   Per test claim: PA required; PA submitted to above mentioned insurance via Phone Key/confirmation #/EOC [id number] Status is pending   Fax with supporting documentation has been sent. Determination will take up to 72 hrs

## 2024-11-17 ENCOUNTER — Encounter: Admitting: Family Medicine
